# Patient Record
Sex: Female | Born: 1955
Health system: Southern US, Community
[De-identification: ages and names within clinical notes are randomized; demographics above are authoritative.]

## PROBLEM LIST (undated history)

## (undated) DIAGNOSIS — I10 Essential (primary) hypertension: Secondary | ICD-10-CM

## (undated) DIAGNOSIS — Z9889 Other specified postprocedural states: Secondary | ICD-10-CM

## (undated) DIAGNOSIS — K5010 Crohn's disease of large intestine without complications: Secondary | ICD-10-CM

## (undated) DIAGNOSIS — Z803 Family history of malignant neoplasm of breast: Secondary | ICD-10-CM

## (undated) DIAGNOSIS — A0472 Enterocolitis due to Clostridium difficile, not specified as recurrent: Secondary | ICD-10-CM

## (undated) DIAGNOSIS — R112 Nausea with vomiting, unspecified: Secondary | ICD-10-CM

## (undated) DIAGNOSIS — E119 Type 2 diabetes mellitus without complications: Secondary | ICD-10-CM

## (undated) DIAGNOSIS — E785 Hyperlipidemia, unspecified: Secondary | ICD-10-CM

## (undated) HISTORY — DX: Enterocolitis due to Clostridium difficile, not specified as recurrent: A04.72

## (undated) HISTORY — DX: Type 2 diabetes mellitus without complications: E11.9

## (undated) HISTORY — PX: SPINE SURGERY: SHX786

## (undated) HISTORY — DX: Crohn's disease of large intestine without complications: K50.10

## (undated) HISTORY — DX: Family history of malignant neoplasm of breast: Z80.3

## (undated) HISTORY — PX: KNEE SURGERY: SHX244

## (undated) HISTORY — PX: APPENDECTOMY: SHX54

---

## 1999-11-22 HISTORY — PX: OTHER SURGICAL HISTORY: SHX169

## 2005-03-15 ENCOUNTER — Encounter (INDEPENDENT_AMBULATORY_CARE_PROVIDER_SITE_OTHER): Payer: Self-pay | Admitting: *Deleted

## 2005-03-15 ENCOUNTER — Ambulatory Visit (HOSPITAL_BASED_OUTPATIENT_CLINIC_OR_DEPARTMENT_OTHER): Admission: RE | Admit: 2005-03-15 | Discharge: 2005-03-15 | Payer: Self-pay | Admitting: Obstetrics and Gynecology

## 2005-03-15 ENCOUNTER — Ambulatory Visit (HOSPITAL_COMMUNITY): Admission: RE | Admit: 2005-03-15 | Discharge: 2005-03-15 | Payer: Self-pay | Admitting: Obstetrics and Gynecology

## 2005-07-26 ENCOUNTER — Other Ambulatory Visit: Admission: RE | Admit: 2005-07-26 | Discharge: 2005-07-26 | Payer: Self-pay | Admitting: Obstetrics and Gynecology

## 2006-05-05 ENCOUNTER — Other Ambulatory Visit: Admission: RE | Admit: 2006-05-05 | Discharge: 2006-05-05 | Payer: Self-pay | Admitting: Obstetrics and Gynecology

## 2006-08-18 ENCOUNTER — Ambulatory Visit (HOSPITAL_COMMUNITY): Admission: RE | Admit: 2006-08-18 | Discharge: 2006-08-18 | Payer: Self-pay | Admitting: General Surgery

## 2006-08-18 ENCOUNTER — Encounter (INDEPENDENT_AMBULATORY_CARE_PROVIDER_SITE_OTHER): Payer: Self-pay | Admitting: Specialist

## 2006-10-28 ENCOUNTER — Emergency Department (HOSPITAL_COMMUNITY): Admission: EM | Admit: 2006-10-28 | Discharge: 2006-10-28 | Payer: Self-pay | Admitting: Emergency Medicine

## 2008-08-27 ENCOUNTER — Other Ambulatory Visit: Admission: RE | Admit: 2008-08-27 | Discharge: 2008-08-27 | Payer: Self-pay | Admitting: Family Medicine

## 2009-11-21 HISTORY — PX: CHOLECYSTECTOMY: SHX55

## 2011-04-08 NOTE — Op Note (Signed)
[name], [name]                 ACCOUNT NO.:  [id number]   MEDICAL RECORD NO.:  [id number]          PATIENT TYPE:  AMB   LOCATION:  SDS                          FACILITY:  [facility]   PHYSICIAN:  Kathrin Penner, M.D.   DATE OF BIRTH:  Oct 14, 1956   DATE OF PROCEDURE:  08/18/2006  DATE OF DISCHARGE:  08/18/2006                                 OPERATIVE REPORT   PREOPERATIVE DIAGNOSIS:  Chronic calculous cholecystitis.   POSTOPERATIVE DIAGNOSIS:  Chronic calculous cholecystitis.   PROCEDURE:  Laparoscopic cholecystectomy with intraoperative cholangiogram.   SURGEON:  Kathrin Penner, MD   ASSISTANT:  Clinton D. Annamaria Boots, MD   ANESTHESIA:  General.   NOTE:  Angela [name] is a 55 year old female presenting with symptomatic  calculous cholecystitis and documented gallstones on ultrasound, liver  function studies within normal limits.  She comes to the operating room now  after the risks and potential benefits of surgery have been discussed, all  questions answered and consent obtained.   PROCEDURE:  With the patient positioned supinely, the abdomen is prepped and  draped to be included in a sterile operative field following general  anesthesia.  Open laparoscopy created at the umbilicus with insufflation of  a Hasson cannula and with insufflation of the peritoneal cavity through the  [facility] cannula up to 14 mmHg CO2.  Camera inserted and visual exploration of  the abdomen carried out.  Chronically scarred gallbladder with multiple  adhesions, otherwise negative exploration of the abdomen.  Under direct  vision, epigastric and lateral ports were placed.  The gallbladder is  grasped and retracted cephalad.  Dissection carried down through the ampulla  and along the hepatoduodenal ligament to isolate the cystic artery and  cystic duct.  The cystic duct is isolated, clamped proximally and opened.  Cystic duct cholangiogram carried out by passing a Cook catheter into the  cystic duct and  injecting one-half strength Hypaque into the biliary system.  The resulting cholangiogram shows free flow of contrast into the duodenum,  no filling defects.  Catheter was removed and the cystic duct is doubly  clipped, triply clipped and transected.  The cystic artery is dissected  free, doubly clipped and transected.  The gallbladder is then dissected free  from the liver bed using electrocautery and maintaining hemostasis  throughout the course of the dissection with electrocautery.  At the end of  dissection the gallbladder is placed the Endopouch and the remainder of the  liver bed again inspected for hemostasis and noted to be dry.  The  gallbladder was then retrieved through the umbilical port without  difficulty.  Sponge and instrument counts were verified and the wound was  closed in layers as follows after the pneumoperitoneum was allowed to  deflate.   Epigastric and umbilical wound closed in 0 Dexon and 4-0 Monocryl,  epigastric and flank wounds closed with 4-0 Monocryl sutures.  All wounds  reinforced with Steri-Strips.  Sterile dressings applied.  The anesthetic  reversed.  The patient removed from the operating room to the recovery room  in stable condition.  She tolerated the  procedure well.      Kathrin Penner, M.D.  Electronically Signed     PB/MEDQ  D:  08/18/2006  T:  08/21/2006  Job:  [id number]

## 2011-04-08 NOTE — Op Note (Signed)
NAMEMULKI, Angela [name]                 ACCOUNT NO.:  [id number]   MEDICAL RECORD NO.:  [id number]          PATIENT TYPE:  AMB   LOCATION:  [facility]                         FACILITY:  [facility]   PHYSICIAN:  Paula Compton, M.D. DATE OF BIRTH:  [birth year]   DATE OF PROCEDURE:  03/15/2005  DATE OF DISCHARGE:                                 OPERATIVE REPORT   PREOPERATIVE DIAGNOSIS:  High-grade SIL on her Pap smear and ECC status post  previous LEEP procedure.   POSTOPERATIVE DIAGNOSIS:  High-grade SIL on her Pap smear and ECC status  post previous LEEP procedure.   PROCEDURE:  Cold knife conization.   SURGEON:  Dr. Paula Compton.   ANESTHESIA:  General.   SPECIMENS:  Cervical cone specimen sent.   ESTIMATED BLOOD LOSS:  100 cc.   URINE OUTPUT:  Straight cathed for approximately 100 cc after procedure.   FINDINGS:  Cervix was noted to be very short about 1.5 cm long.  No other  abnormalities were noted.   PROCEDURE:  The patient was taken to operating room where general anesthesia  was obtained without difficulty. She was then prepped and draped in normal  sterile fashion in dorsal lithotomy position.  A speculum was then placed  within the vagina and the cervix noted to be quite distal in the vagina as  well as only approximately a 1 to 1.5 cm long; therefore, it was grasped  with a tenaculum in order to place stay sutures at the 2 to 4 o'clock  position and the 10 to 7 o'clock position. These were tied down and dilute  solution of Pitressin was also injected superficially.  The tenaculum was  then removed and the stay sutures pulled straight out to elevate the cervix  towards the introitus as best possible.  The scalpel was then used to  circumferentially cut around the squamocolumnar junction to a depth of  approximately 0.5 cm to 1 cm at its deepest depth.  This was slightly more  difficult on the posterior surface as the cervix was slightly shorter in  this plane from the  prior procedures; however, was successfully removed and  Mayo scissors used to cut across the base of the specimen. This was then  totally amputated and marked at 12 o'clock with a suture.  Some small  additional tissue which was fragmented was also removed with Mayo scissors  and sent with specimen on the posterior surface as the cone was not able to  go quite as deep on this plane. At this point there was some light bleeding  noted which was controlled both with the rollerball cautery of the base of  the cervix and several  other additional sutures were both utilized to control bleeding as well as  evert the squamocolumnar junction with good control of all bleeding.  A  small amount of Monsel was placed within the cervical base and all  instruments and sponges were removed from the patient's vagina. She was  taken to the recovery room in stable condition.      KR/MEDQ  D:  03/15/2005  T:  03/15/2005  Job:  [id number]

## 2013-08-26 ENCOUNTER — Other Ambulatory Visit: Payer: Self-pay | Admitting: Gastroenterology

## 2014-05-28 DIAGNOSIS — N3090 Cystitis, unspecified without hematuria: Secondary | ICD-10-CM | POA: Insufficient documentation

## 2014-05-28 DIAGNOSIS — Z880 Allergy status to penicillin: Secondary | ICD-10-CM | POA: Insufficient documentation

## 2014-05-28 DIAGNOSIS — I10 Essential (primary) hypertension: Secondary | ICD-10-CM | POA: Insufficient documentation

## 2014-05-28 DIAGNOSIS — Z79899 Other long term (current) drug therapy: Secondary | ICD-10-CM | POA: Insufficient documentation

## 2014-05-28 DIAGNOSIS — R319 Hematuria, unspecified: Secondary | ICD-10-CM | POA: Insufficient documentation

## 2014-05-28 DIAGNOSIS — Z792 Long term (current) use of antibiotics: Secondary | ICD-10-CM | POA: Insufficient documentation

## 2014-05-29 ENCOUNTER — Encounter (HOSPITAL_BASED_OUTPATIENT_CLINIC_OR_DEPARTMENT_OTHER): Payer: Self-pay | Admitting: Emergency Medicine

## 2014-05-29 ENCOUNTER — Emergency Department (HOSPITAL_BASED_OUTPATIENT_CLINIC_OR_DEPARTMENT_OTHER)
Admission: EM | Admit: 2014-05-29 | Discharge: 2014-05-29 | Disposition: A | Discharge status: Home / Self Care | Payer: BC Managed Care – PPO | Attending: Emergency Medicine | Admitting: Emergency Medicine

## 2014-05-29 DIAGNOSIS — N3090 Cystitis, unspecified without hematuria: Secondary | ICD-10-CM

## 2014-05-29 HISTORY — DX: Hyperlipidemia, unspecified: E78.5

## 2014-05-29 HISTORY — DX: Essential (primary) hypertension: I10

## 2014-05-29 LAB — URINALYSIS, ROUTINE W REFLEX MICROSCOPIC
GLUCOSE, UA: NEGATIVE mg/dL
Ketones, ur: 40 mg/dL — AB
Nitrite: POSITIVE — AB
PH: 5.5 (ref 5.0–8.0)
Specific Gravity, Urine: 1.029 (ref 1.005–1.030)
Urobilinogen, UA: 1 mg/dL (ref 0.0–1.0)

## 2014-05-29 LAB — URINE MICROSCOPIC-ADD ON

## 2014-05-29 MED ORDER — CIPROFLOXACIN HCL 500 MG PO TABS
500.0000 mg | ORAL_TABLET | Freq: Once | ORAL | Status: AC
  Administered 2014-05-29: 500 mg via ORAL
  Filled 2014-05-29: qty 1

## 2014-05-29 MED ORDER — CIPROFLOXACIN HCL 250 MG PO TABS: 250.0000 mg | ORAL_TABLET | Freq: Two times a day (BID) | ORAL | Status: DC

## 2014-05-29 MED ORDER — PHENAZOPYRIDINE HCL 200 MG PO TABS: 200.0000 mg | ORAL_TABLET | Freq: Three times a day (TID) | ORAL | Status: DC | PRN

## 2014-05-29 NOTE — ED Notes (Signed)
Pt reports chronic history of uti's, states she developed dysuria and blood in urine 1 hour ago.

## 2014-05-29 NOTE — ED Provider Notes (Signed)
CSN: [id number]     Arrival date & time 05/28/14  2357 History   First MD Initiated Contact with Patient 05/29/14 0020     Chief Complaint  Patient presents with  . Dysuria     (Consider location/radiation/quality/duration/timing/severity/associated sxs/prior Treatment) HPI Patient presents with dysuria, urinary frequency and hematuria starting this evening. She states she's had multiple urinary tract infections in the past and the symptoms are exactly the same. Denies any fevers or chills. She's had no nausea or vomiting. She denies any abdominal pain or flank pain. She's had no vaginal symptoms. Past Medical History  Diagnosis Date  . Hypertension   . Hyperlipidemia    History reviewed. No pertinent past surgical history. History reviewed. No pertinent family history. History  Substance Use Topics  . Smoking status: Never Smoker   . Smokeless tobacco: Not on file  . Alcohol Use: No   OB History   Grav Para Term Preterm Abortions TAB SAB Ect Mult Living                 Review of Systems  Constitutional: Negative for fever and chills.  Gastrointestinal: Negative for nausea, vomiting and abdominal pain.  Genitourinary: Positive for dysuria, frequency and hematuria. Negative for flank pain, vaginal bleeding and pelvic pain.  Musculoskeletal: Negative for back pain, myalgias, neck pain and neck stiffness.  Skin: Negative for rash and wound.  Neurological: Negative for dizziness, weakness, light-headedness, numbness and headaches.  All other systems reviewed and are negative.     Allergies  Penicillins  Home Medications   Prior to Admission medications   Medication Sig Start Date End Date Taking? Authorizing Provider  benazepril-hydrochlorthiazide (LOTENSIN HCT) 10-12.5 MG per tablet Take 1 tablet by mouth daily.   Yes Historical Provider, MD  ciprofloxacin (CIPRO) 250 MG tablet Take 1 tablet (250 mg total) by mouth every 12 (twelve) hours. 05/29/14   Julianne Rice, MD   phenazopyridine (PYRIDIUM) 200 MG tablet Take 1 tablet (200 mg total) by mouth 3 (three) times daily as needed for pain. 05/29/14   Julianne Rice, MD   BP 130/83  Pulse 76  Temp(Src) 98 F (36.7 C) (Oral)  Resp 18  Ht 5' 4"  (1.626 m)  Wt 150 lb (68.04 kg)  BMI 25.73 kg/m2  SpO2 96% Physical Exam  Nursing note and vitals reviewed. Constitutional: She is oriented to person, place, and time. She appears well-developed and well-nourished. No distress.  HENT:  Head: Normocephalic and atraumatic.  Mouth/Throat: Oropharynx is clear and moist.  Eyes: EOM are normal. Pupils are equal, round, and reactive to light.  Neck: Normal range of motion. Neck supple.  Cardiovascular: Normal rate and regular rhythm.   Pulmonary/Chest: Effort normal and breath sounds normal. No respiratory distress. She has no wheezes. She has no rales.  Abdominal: Soft. Bowel sounds are normal. She exhibits no distension and no mass. There is no tenderness. There is no rebound and no guarding.  Musculoskeletal: Normal range of motion. She exhibits no edema and no tenderness.  No CVA tenderness.  Neurological: She is alert and oriented to person, place, and time.  Skin: Skin is warm and dry. No rash noted. No erythema.  Psychiatric: She has a normal mood and affect. Her behavior is normal.    ED Course  Procedures (including critical care time) Labs Review Labs Reviewed  URINALYSIS, ROUTINE W REFLEX MICROSCOPIC - Abnormal; Notable for the following:    Color, Urine RED (*)    APPearance TURBID (*)  Hgb urine dipstick LARGE (*)    Bilirubin Urine LARGE (*)    Ketones, ur 40 (*)    Protein, ur >300 (*)    Nitrite POSITIVE (*)    Leukocytes, UA LARGE (*)    All other components within normal limits  URINE MICROSCOPIC-ADD ON - Abnormal; Notable for the following:    Squamous Epithelial / LPF FEW (*)    Bacteria, UA MANY (*)    All other components within normal limits    Imaging Review No results  found.   EKG Interpretation None      MDM   Final diagnoses:  Cystitis    Patient or well-appearing presents with symptoms of urinary tract infection and evidence of on urinalysis. Antibiotic given in the emergency department and discharged home with prescription. Return precautions given.    Julianne Rice, MD 05/29/14 [phone]

## 2014-05-29 NOTE — ED Notes (Signed)
Sudden onset of pain w urination,  Increased freq, and blood in urine approx 1 hr pta,  Hx of uti

## 2014-05-29 NOTE — Discharge Instructions (Signed)

## 2014-11-19 ENCOUNTER — Other Ambulatory Visit: Payer: Self-pay | Admitting: Family Medicine

## 2014-11-19 DIAGNOSIS — M5410 Radiculopathy, site unspecified: Secondary | ICD-10-CM

## 2014-11-19 DIAGNOSIS — M545 Low back pain: Secondary | ICD-10-CM

## 2014-11-24 ENCOUNTER — Ambulatory Visit: Payer: BC Managed Care – PPO

## 2014-12-01 ENCOUNTER — Other Ambulatory Visit: Payer: Self-pay

## 2014-12-01 ENCOUNTER — Other Ambulatory Visit: Payer: 59

## 2014-12-11 DIAGNOSIS — M5126 Other intervertebral disc displacement, lumbar region: Secondary | ICD-10-CM | POA: Insufficient documentation

## 2016-08-08 ENCOUNTER — Other Ambulatory Visit: Payer: Self-pay | Admitting: Family Medicine

## 2017-01-02 ENCOUNTER — Other Ambulatory Visit: Payer: Self-pay | Admitting: Family Medicine

## 2017-01-27 DIAGNOSIS — E782 Mixed hyperlipidemia: Secondary | ICD-10-CM | POA: Diagnosis not present

## 2017-01-27 DIAGNOSIS — R6889 Other general symptoms and signs: Secondary | ICD-10-CM | POA: Diagnosis not present

## 2017-01-27 DIAGNOSIS — I10 Essential (primary) hypertension: Secondary | ICD-10-CM | POA: Diagnosis not present

## 2017-03-13 DIAGNOSIS — R3915 Urgency of urination: Secondary | ICD-10-CM | POA: Diagnosis not present

## 2017-03-13 DIAGNOSIS — R300 Dysuria: Secondary | ICD-10-CM | POA: Diagnosis not present

## 2017-09-04 DIAGNOSIS — R590 Localized enlarged lymph nodes: Secondary | ICD-10-CM | POA: Diagnosis not present

## 2017-09-04 DIAGNOSIS — E782 Mixed hyperlipidemia: Secondary | ICD-10-CM | POA: Diagnosis not present

## 2017-09-04 DIAGNOSIS — I10 Essential (primary) hypertension: Secondary | ICD-10-CM | POA: Diagnosis not present

## 2017-09-15 DIAGNOSIS — L739 Follicular disorder, unspecified: Secondary | ICD-10-CM | POA: Diagnosis not present

## 2017-11-08 ENCOUNTER — Encounter: Payer: Self-pay | Admitting: Genetic Counselor

## 2017-11-08 ENCOUNTER — Ambulatory Visit (HOSPITAL_BASED_OUTPATIENT_CLINIC_OR_DEPARTMENT_OTHER): Payer: 59 | Admitting: Genetic Counselor

## 2017-11-08 ENCOUNTER — Other Ambulatory Visit: Payer: 59

## 2017-11-08 DIAGNOSIS — Z803 Family history of malignant neoplasm of breast: Secondary | ICD-10-CM | POA: Diagnosis not present

## 2017-11-08 NOTE — Progress Notes (Signed)
REFERRING PROVIDER: Jovita Kussmaul, MD [address], [place] [address]  PRIMARY PROVIDER:  Orpah Melter, MD  PRIMARY REASON FOR VISIT:  1. Family history of breast cancer      HISTORY OF PRESENT ILLNESS:   Ms. Thurmon, a 61 y.o. female, was seen for a [facility] cancer genetics consultation at the request of Dr. Marlou Starks due to a family history of cancer.  Ms. Guerry presents to clinic today to discuss the possibility of a hereditary predisposition to cancer, genetic testing, and to further clarify her future cancer risks, as well as potential cancer risks for family members. Ms. Chenard is a 61 y.o. female with no personal history of cancer.    CANCER HISTORY:   No history exists.     HORMONAL RISK FACTORS:  Menarche was at age 70.  First live birth at age 17.  OCP use for approximately <5 years.  Ovaries intact: yes.  Hysterectomy: no.  Menopausal status: postmenopausal.  HRT use: 0 years. Colonoscopy: yes; normal. Mammogram within the last year: yes. Number of breast biopsies: 1. Up to date with pelvic exams:  yes. Any excessive radiation exposure in the past:  no  Past Medical History:  Diagnosis Date  . Family history of breast cancer   . Hyperlipidemia   . Hypertension     No past surgical history on file.  Social History   Socioeconomic History  . Marital status: Married    Spouse name: Not on file  . Number of children: 3  . Years of education: Not on file  . Highest education level: Not on file  Social Needs  . Financial resource strain: Not on file  . Food insecurity - worry: Not on file  . Food insecurity - inability: Not on file  . Transportation needs - medical: Not on file  . Transportation needs - non-medical: Not on file  Occupational History  . Not on file  Tobacco Use  . Smoking status: Current Some Day Smoker    Years: 20.00  . Smokeless tobacco: Never Used  Substance and Sexual Activity  . Alcohol use: No  . Drug use: Not  on file  . Sexual activity: Not on file  Other Topics Concern  . Not on file  Social History Narrative  . Not on file     FAMILY HISTORY:  We obtained a detailed, 4-generation family history.  Significant diagnoses are listed below: Family History  Problem Relation Age of Onset  . Esophageal cancer Father   . Leukemia Sister 5  . Breast cancer Maternal Aunt 75  . Breast cancer Maternal Grandmother        dx in her late 38s  . Heart attack Paternal Grandfather   . Cervical cancer Sister     The patient has three sons who are cancer free.  She has four sisters and two brothers.  One sister had leukemia at age 36, another sister had cervical cancer.  The patients father is deceased and her mother is alive.  The patient's mother was diagnosed with breast cancer at age 26.  She had two sisters, one who had breast cancer at 72 and died at 47.  The maternal grandparents are deceased.  The grandmother had breast cancer in her late 84's, and the grandfather died in his 44's.  The patient's father ha done sister who is living.  She had a son who had an unknown cancer.  The paternal grandparents are deceased.  The  grandfather from a heart attack and the grandmother from old age.  Ms. Mizzell is unaware of previous family history of genetic testing for hereditary cancer risks. Patient's maternal ancestors are of [place] descent, and paternal ancestors are of [place], [place], [place] and [place] descent. There is no reported Ashkenazi Jewish ancestry. There is no known consanguinity.  GENETIC COUNSELING ASSESSMENT: Jermiah Howton is a 61 y.o. female with a family history of breast cancer which is somewhat suggestive of a hereditary cancer syndrome and predisposition to cancer. We, therefore, discussed and recommended the following at today's visit.   DISCUSSION: We discussed that about 5-10% of breast cancer is hereditary with most cases due to BRCA mutations.  Other genes associated with hereditary breast  cancer syndromes include ATM, CHEK2 and PALB2.  We reviewed the characteristics, features and inheritance patterns of hereditary cancer syndromes. We also discussed genetic testing, including the appropriate family members to test, the process of testing, insurance coverage and turn-around-time for results. Based on the family history, the patient is not the best person to test. She would not be informative for the rest of the family.  The patient's mother would be a better person to test.  The patient feels that it would be too difficult to get her mother tested. We discussed the implications of a negative, positive and/or variant of uncertain significant result. We recommended Ms. Lancon pursue genetic testing for the common hereditary cancer panel.  The Hereditary Gene Panel offered by [facility] includes sequencing and/or deletion duplication testing of the following 47 genes: APC, ATM, AXIN2, BARD1, BMPR1A, BRCA1, BRCA2, BRIP1, CDH1, CDK4, CDKN2A (p14ARF), CDKN2A (p16INK4a), CHEK2, CTNNA1, DICER1, EPCAM (Deletion/duplication testing only), GREM1 (promoter region deletion/duplication testing only), KIT, MEN1, MLH1, MSH2, MSH3, MSH6, MUTYH, NBN, NF1, NHTL1, PALB2, PDGFRA, PMS2, POLD1, POLE, PTEN, RAD50, RAD51C, RAD51D, SDHB, SDHC, SDHD, SMAD4, SMARCA4. STK11, TP53, TSC1, TSC2, and VHL.  The following genes were evaluated for sequence changes only: SDHA and HOXB13 c.251G>A variant only.  gene panel.   Based on Ms. Clare's family history of cancer, she meets medical criteria for genetic testing. Despite that she meets criteria, she may still have an out of pocket cost. We discussed that if her out of pocket cost for testing is over $100, the laboratory will call and confirm whether she wants to proceed with testing.  If the out of pocket cost of testing is less than $100 she will be billed by the genetic testing laboratory.   PLAN: After considering the risks, benefits, and limitations, Ms. Mcconnon  provided informed  consent to pursue genetic testing and the blood sample was sent to [facility] for analysis of the common hereditary cancer panel. Results should be available within approximately 2-3 weeks' time, at which point they will be disclosed by telephone to Ms. Elk, as will any additional recommendations warranted by these results. Ms. Dillen will receive a summary of her genetic counseling visit and a copy of her results once available. This information will also be available in Epic. We encouraged Ms. Leech to remain in contact with cancer genetics annually so that we can continuously update the family history and inform her of any changes in cancer genetics and testing that may be of benefit for her family. Ms. Boyte questions were answered to her satisfaction today. Our contact information was provided should additional questions or concerns arise.  Lastly, we encouraged Ms. Syler to remain in contact with cancer genetics annually so that we can continuously update the family history and  inform her of any changes in cancer genetics and testing that may be of benefit for this family.   Ms.  Vantol questions were answered to her satisfaction today. Our contact information was provided should additional questions or concerns arise. Thank you for the referral and allowing [place] to share in the care of your patient.   Rhiannon Sassaman P. Florene Glen, [place], [facility] Certified Genetic Counselor [email or web address] phone: [phone]  The patient was seen for a total of 35 minutes in face-to-face genetic counseling.  This patient was discussed with Drs. Magrinat, Lindi Adie and/or Burr Medico who agrees with the above.    _______________________________________________________________________ For Office Staff:  Number of people involved in session: 1 Was an Intern/ student involved with case: no

## 2017-11-23 ENCOUNTER — Encounter: Payer: Self-pay | Admitting: Genetic Counselor

## 2017-11-23 DIAGNOSIS — Z1379 Encounter for other screening for genetic and chromosomal anomalies: Secondary | ICD-10-CM | POA: Insufficient documentation

## 2017-11-24 ENCOUNTER — Telehealth: Payer: Self-pay | Admitting: Genetic Counselor

## 2017-11-24 ENCOUNTER — Ambulatory Visit: Payer: Self-pay | Admitting: Genetic Counselor

## 2017-11-24 DIAGNOSIS — Z1379 Encounter for other screening for genetic and chromosomal anomalies: Secondary | ICD-10-CM

## 2017-11-24 DIAGNOSIS — Z803 Family history of malignant neoplasm of breast: Secondary | ICD-10-CM

## 2017-11-24 NOTE — Progress Notes (Signed)
HPI: Angela [name] was previously seen in the [facility] due to a family history of cancer and concerns regarding a hereditary predisposition to cancer. Please refer to our prior cancer genetics clinic note for more information regarding Angela [name]'s medical, social and family histories, and our assessment and recommendations, at the time. Angela [name] recent genetic test results were disclosed to her, as were recommendations warranted by these results. These results and recommendations are discussed in more detail below.  CANCER HISTORY:   No history exists.    FAMILY HISTORY:  We obtained a detailed, 4-generation family history.  Significant diagnoses are listed below: Family History  Problem Relation Age of Onset  . Esophageal cancer Father   . Leukemia Sister 5  . Breast cancer Maternal Aunt 12  . Breast cancer Maternal Grandmother        dx in her late 1s  . Heart attack Paternal Grandfather   . Cervical cancer Sister     The patient has three sons who are cancer free.  She has four sisters and two brothers.  One sister had leukemia at age 24, another sister had cervical cancer.  The patients father is deceased and her mother is alive.  The patient's mother was diagnosed with breast cancer at age 96.  She had two sisters, one who had breast cancer at 65 and died at 93.  The maternal grandparents are deceased.  The grandmother had breast cancer in her late 2's, and the grandfather died in his 11's.  The patient's father ha done sister who is living.  She had a son who had an unknown cancer.  The paternal grandparents are deceased.  The grandfather from a heart attack and the grandmother from old age.  Angela [name] is unaware of previous family history of genetic testing for hereditary cancer risks. Patient's maternal ancestors are of [place] descent, and paternal ancestors are of [place], [place], [place] and [place] descent. There is no reported Ashkenazi Jewish ancestry.  There is no known consanguinity.  GENETIC TEST RESULTS: Genetic testing reported out on November 24, 2016 through the common hereditary cancer panel found no deleterious mutations.  The Hereditary Gene Panel offered by [facility] includes sequencing and/or deletion duplication testing of the following 47 genes: APC, ATM, AXIN2, BARD1, BMPR1A, BRCA1, BRCA2, BRIP1, CDH1, CDK4, CDKN2A (p14ARF), CDKN2A (p16INK4a), CHEK2, CTNNA1, DICER1, EPCAM (Deletion/duplication testing only), GREM1 (promoter region deletion/duplication testing only), KIT, MEN1, MLH1, MSH2, MSH3, MSH6, MUTYH, NBN, NF1, NHTL1, PALB2, PDGFRA, PMS2, POLD1, POLE, PTEN, RAD50, RAD51C, RAD51D, SDHB, SDHC, SDHD, SMAD4, SMARCA4. STK11, TP53, TSC1, TSC2, and VHL.  The following genes were evaluated for sequence changes only: SDHA and HOXB13 c.251G>A variant only.   The test report has been scanned into EPIC and is located under the Molecular Pathology section of the Results Review tab.   We discussed with Angela [name] that since the current genetic testing is not perfect, it is possible there may be a gene mutation in one of these genes that current testing cannot detect, but that chance is small. We also discussed, that it is possible that another gene that has not yet been discovered, or that we have not yet tested, is responsible for the cancer diagnoses in the family, and it is, therefore, important to remain in touch with cancer genetics in the future so that we can continue to offer Angela [name] the most up to date genetic testing.   Genetic testing did detect a Variant of Unknown Significance in  the MSH2 gene called c.1892G>A (p.Arg631Lys). This is a variant that is not present in population databases and has not been reported in the literature in individuals with an MSH2-related disease. At this time, it is unknown if this variant is associated with increased cancer risk or if this is a normal finding, but most variants such as this get reclassified to  being inconsequential. It should not be used to make medical management decisions. With time, we suspect the lab will determine the significance of this variant, if any. If we do learn more about it, we will try to contact Angela [name] to discuss it further. However, it is important to stay in touch with [place] periodically and keep the address and phone number up to date.     CANCER SCREENING RECOMMENDATIONS:  This normal result is reassuring and indicates that Angela [name] does not likely have an increased risk of cancer due to a mutation in one of these genes.  We, therefore, recommended  Angela [name] continue to follow the cancer screening guidelines provided by her primary healthcare providers.   Based on the Angela [name]'s personal and family history of cancer, as well as her genetic test results, statistical models (Tyrer [place] and [place] model)  and literature data were used to estimate her risk of developing breast cancer. These estimates her lifetime risk of developing breast cancer to be approximately 15.5% to 17%.  The patient's lifetime breast cancer risk is a preliminary estimate based on available information using one of several models endorsed by the [place] (ACS). The ACS recommends consideration of breast MRI screening as an adjunct to mammography for patients at high risk (defined as 20% or greater lifetime risk). A more detailed breast cancer risk assessment can be considered, if clinically indicated. At this time, Angela [name] does not meet ACS criteria to be followed by breast MRI.     RECOMMENDATIONS FOR FAMILY MEMBERS: Women in this family might be at some increased risk of developing cancer, over the general population risk, simply due to the family history of cancer. We recommended women in this family have a yearly mammogram beginning at age 16, or 62 years younger than the earliest onset of cancer, an annual clinical breast exam, and perform monthly breast self-exams. Women in  this family should also have a gynecological exam as recommended by their primary provider. All family members should have a colonoscopy by age 28.  Based on Angela [name]'s family history, we recommended her sisters have genetic counseling and testing. Angela [name] will let us know if we can be of any assistance in coordinating genetic counseling and/or testing for this family member.   FOLLOW-UP: Lastly, we discussed with Angela [name] that cancer genetics is a rapidly advancing field and it is possible that new genetic tests will be appropriate for her and/or her family members in the future. We encouraged her to remain in contact with cancer genetics on an annual basis so we can update her personal and family histories and let her know of advances in cancer genetics that may benefit this family.   Our contact number was provided. Angela [name] questions were answered to her satisfaction, and she knows she is welcome to call us at anytime with additional questions or concerns.   Roma Kayser, MS, [facility] Certified Genetic Counselor [email or web address]

## 2017-11-24 NOTE — Telephone Encounter (Signed)
Revealed negative genetic testing.  Discussed that we do not know why there is cancer in the family. It could be due to a different gene that we are not testing, or maybe our current technology may not be able to pick something up.  It will be important for her to keep in contact with genetics to keep up with whether additional testing may be needed.

## 2017-11-25 DIAGNOSIS — R05 Cough: Secondary | ICD-10-CM | POA: Diagnosis not present

## 2017-11-25 DIAGNOSIS — J189 Pneumonia, unspecified organism: Secondary | ICD-10-CM | POA: Diagnosis not present

## 2017-12-13 DIAGNOSIS — J069 Acute upper respiratory infection, unspecified: Secondary | ICD-10-CM | POA: Diagnosis not present

## 2017-12-21 DIAGNOSIS — R509 Fever, unspecified: Secondary | ICD-10-CM | POA: Diagnosis not present

## 2017-12-21 DIAGNOSIS — R5383 Other fatigue: Secondary | ICD-10-CM | POA: Diagnosis not present

## 2017-12-29 DIAGNOSIS — R509 Fever, unspecified: Secondary | ICD-10-CM | POA: Diagnosis not present

## 2018-02-01 DIAGNOSIS — R509 Fever, unspecified: Secondary | ICD-10-CM | POA: Diagnosis not present

## 2018-02-06 ENCOUNTER — Ambulatory Visit (INDEPENDENT_AMBULATORY_CARE_PROVIDER_SITE_OTHER): Payer: 59 | Admitting: Family

## 2018-02-06 ENCOUNTER — Encounter: Payer: Self-pay | Admitting: Family

## 2018-02-06 VITALS — BP 112/71 | HR 85 | Temp 98.3°F | Ht 65.0 in | Wt 154.8 lb

## 2018-02-06 DIAGNOSIS — Z7289 Other problems related to lifestyle: Secondary | ICD-10-CM

## 2018-02-06 DIAGNOSIS — R509 Fever, unspecified: Secondary | ICD-10-CM | POA: Diagnosis not present

## 2018-02-06 NOTE — Patient Instructions (Signed)
Nice to meet you.  We will check your blood work today to look for infections.   We may need to consider imaging.  Please work on completing your cancer screenings including your PAP, colonoscopy and mammogram.  Continue to monitor your fevers at home.  We will follow up once blood work is available.  Fever, Adult A fever is an increase in the body's temperature. It is usually defined as a temperature of 100F (38C) or higher. Brief mild or moderate fevers generally have no long-term effects, and they often do not require treatment. Moderate or high fevers may make you feel uncomfortable and can sometimes be a sign of a serious illness or disease. The sweating that may occur with repeated or prolonged fever may also cause dehydration. Fever is confirmed by taking a temperature with a thermometer. A measured temperature can vary with:  Age.  Time of day.  Location of the thermometer: ? Mouth (oral). ? Rectum (rectal). ? Ear (tympanic). ? Underarm (axillary). ? Forehead (temporal).  Follow these instructions at home: Pay attention to any changes in your symptoms. Take these actions to help with your condition:  Take over-the counter and prescription medicines only as told by your health care provider. Follow the dosing instructions carefully.  If you were prescribed an antibiotic medicine, take it as told by your health care provider. Do not stop taking the antibiotic even if you start to feel better.  Rest as needed.  Drink enough fluid to keep your urine clear or pale yellow. This helps to prevent dehydration.  Sponge yourself or bathe with room-temperature water to help reduce your body temperature as needed. Do not use ice water.  Do not overbundle yourself in blankets or heavy clothes.  Contact a health care provider if:  You vomit.  You cannot eat or drink without vomiting.  You have diarrhea.  You have pain when you urinate.  Your symptoms do not improve with  treatment.  You develop new symptoms.  You develop excessive weakness. Get help right away if:  You have shortness of breath or have trouble breathing.  You are dizzy or you faint.  You are disoriented or confused.  You develop signs of dehydration, such as a dry mouth, decreased urination, or paleness.  You develop severe pain in your abdomen.  You have persistent vomiting or diarrhea.  You develop a skin rash.  Your symptoms suddenly get worse. This information is not intended to replace advice given to you by your health care provider. Make sure you discuss any questions you have with your health care provider. Document Released: 05/03/2001 Document Revised: 04/14/2016 Document Reviewed: 01/01/2015 Elsevier Interactive Patient Education  [employer].

## 2018-02-06 NOTE — Assessment & Plan Note (Addendum)
Angela [name] has had a fever for about 3.5 months ranging from 100-103 resulting in her taking antipyretics to help control. Based on her lab work reviewed I do not believe this is related to EBV, as all of the IgG's are positive with no indication of acute infection. There is concern for infection or potential malignancy. I have requested she work on completing her cancer screenings including PAP, colonoscopy and mammogram. I will check her blood cultures, inflammatory markers, HIV, Hepatitis C, ANA, RF , and LDH. Recommend that she monitor her temperatures through temperature log. We will follow up once results are obtained.

## 2018-02-06 NOTE — Progress Notes (Signed)
Subjective:    Patient ID: Angela [name], female    DOB: July 28, 1956, 62 y.o.   MRN: [id number]  Chief Complaint  Patient presents with  . Follow-up    fever X 3.5 months    HPI:  Angela [name] is a 62 y.o. female who presents today evaluation of fevers.  [name] symptoms began in December 2018 when she was seen in urgent care and diagnosed with pneumonia. She was treated with azithromycin from which she initially improved however fevers returned but symptoms of pneumonia were improved. She was seen in her primary care office on 1/23 with concerns for fatigue, rhinorrhea, sore throat, chest congestion, and a fever of 102. She was diagnosed at the time with upper respiratory infection and started on doxycycline for 7 days as well as symptomatic treatment. She was seen on 1/31 with continued fevers and feeling "exhausted" with fevers primarily occurring in the afternoon. At that time her highest fever was 100.0. Other symptoms improved following completion of doxycycline. At this office visit she was noted to have abdominal pain described as sharp and jabbing which was intermittent. Her Monospot was negative she did have leukocytosis of 16.1 as well as an elevated neutrophil count of 12.3. All other labs were unremarkable. She was seen once again in primary care in 2/88 with ongoing fevers and exhaustion with intermittent fevers now on the morning as well ranging between 99 and 101. She also noted increased sleeping going to that at 8:30 in the evening and waking up at 10 AM is still feeling tired. White blood count remained elevated at 16.3 with continued increase in neutrophils and now mild increase in monocytes. Epstein-Barr virus antibody VCA IgG and nuclear antibody IgG were all positive. She was most recently seen on 3/14 with fevers ranging from 100 to 102 and noted weight change of approximately 10 pound weight loss. She described having night sweats when her fever breaks at night.  She  continues to have fevers and wakes up in the morning ranging from 100.0-101. At night fevers are ranging from 102-103. Modifying factors include Motrin and Advil. Other symptoms that she is having include chest with coughing and describes as feeling like congestion. Symptoms are generally worsening. States she is drinking a lot of water which she has not done in the past. Fatigue has been severe enough to interfere with her day to day activities. Denies being in any communal settings recently.       Outpatient Medications Prior to Visit  Medication Sig Dispense Refill  . acetaminophen (TYLENOL) 325 MG tablet Take 650 mg by mouth every 4 (four) hours as needed.    . benazepril-hydrochlorthiazide (LOTENSIN HCT) 10-12.5 MG per tablet Take 1 tablet by mouth daily.    Marland Kitchen ibuprofen (ADVIL,MOTRIN) 400 MG tablet Take 400 mg by mouth every 4 (four) hours as needed for fever or headache.    . ciprofloxacin (CIPRO) 250 MG tablet Take 1 tablet (250 mg total) by mouth every 12 (twelve) hours. (Patient not taking: Reported on 02/06/2018) 10 tablet 0  . phenazopyridine (PYRIDIUM) 200 MG tablet Take 1 tablet (200 mg total) by mouth 3 (three) times daily as needed for pain. 10 tablet 0   No facility-administered medications prior to visit.      Past Medical History:  Diagnosis Date  . Family history of breast cancer   . Hyperlipidemia   . Hypertension       Past Surgical History:  Procedure Laterality Date  . CESAREAN SECTION    .  KNEE SURGERY Right   . SPINE SURGERY     Herniated Disc      Family History  Problem Relation Age of Onset  . Breast cancer Mother   . Esophageal cancer Father   . Leukemia Sister 5  . Breast cancer Maternal Aunt 37  . Breast cancer Maternal Grandmother        dx in her late 35s  . Heart attack Paternal Grandfather   . Cervical cancer Sister       Social History   Socioeconomic History  . Marital status: Married    Spouse name: Not on file  . Number of  children: 3  . Years of education: 81  . Highest education level: Not on file  Social Needs  . Financial resource strain: Not on file  . Food insecurity - worry: Not on file  . Food insecurity - inability: Not on file  . Transportation needs - medical: Not on file  . Transportation needs - non-medical: Not on file  Occupational History  . Not on file  Tobacco Use  . Smoking status: Former Smoker    Years: 20.00  . Smokeless tobacco: Never Used  Substance and Sexual Activity  . Alcohol use: No  . Drug use: No  . Sexual activity: Not on file  Other Topics Concern  . Not on file  Social History Narrative  . Not on file      Review of Systems  Constitutional: Positive for fatigue, fever and unexpected weight change. Negative for appetite change, chills and diaphoresis.  Respiratory: Positive for cough (occasional). Negative for shortness of breath and wheezing.   Cardiovascular: Negative for chest pain, palpitations and leg swelling.       Objective:    BP 112/71 (BP Location: Right Arm, Patient Position: Sitting, Cuff Size: Normal)   Pulse 85   Temp 98.3 F (36.8 C) (Oral)   Ht 5' 5"  (1.651 m)   Wt 154 lb 12 oz (70.2 kg)   BMI 25.75 kg/m  Nursing note and vital signs reviewed.  Physical Exam  Constitutional: She is oriented to person, place, and time. She appears well-developed and well-nourished. No distress.  Neck: Neck supple.  Cardiovascular: Normal rate, regular rhythm, normal heart sounds and intact distal pulses. Exam reveals no gallop and no friction rub.  No murmur heard. Pulmonary/Chest: Effort normal and breath sounds normal. No respiratory distress. She has no wheezes. She has no rales. She exhibits no tenderness.  Abdominal: Soft. Bowel sounds are normal. She exhibits no distension. There is tenderness (Mild RLQ tenderness lower than McBurney's ). There is no rebound and no guarding.  Lymphadenopathy:    She has no cervical adenopathy.  Neurological:  She is alert and oriented to person, place, and time.  Skin: Skin is warm and dry. No rash noted. No erythema.  Psychiatric: She has a normal mood and affect. Her behavior is normal. Judgment and thought content normal.        Assessment & Plan:   Problem List Items Addressed This Visit      Other   Fever - Primary    Mrs. Cona has had a fever for about 3.5 months ranging from 100-103 resulting in her taking antipyretics to help control. Based on her lab work reviewed I do not believe this is related to EBV, as all of the IgG's are positive with no indication of acute infection. There is concern for infection or potential malignancy. I have requested she work on  completing her cancer screenings including PAP, colonoscopy and mammogram. I will check her blood cultures, inflammatory markers, HIV, Hepatitis C, ANA, RF , and LDH. Recommend that she monitor her temperatures through temperature log. We will follow up once results are obtained.       Relevant Orders   CBC w/Diff   Culture, blood (single)   Culture, blood (single)   HIV antibody (with reflex)   CMV abs, IgG+IgM (cytomegalovirus)   Lactate Dehydrogenase (LDH)   C-reactive protein   Sedimentation rate   Rheumatoid Factor   Antinuclear Antib (ANA)   QuantiFERON-TB Gold Plus    Other Visit Diagnoses    Other problems related to lifestyle       Relevant Orders   Hepatitis C Antibody       I have discontinued Gloris Crystal's ciprofloxacin and phenazopyridine. I am also having her maintain her benazepril-hydrochlorthiazide, ibuprofen, and acetaminophen.   Follow-up: Return in about 3 weeks (around 02/27/2018), or if symptoms worsen or fail to improve.  Mauricio Po, [facility]

## 2018-02-07 LAB — QUANTIFERON-TB GOLD PLUS
MITOGEN-NIL: 6.46 [IU]/mL
NIL: 0.02 IU/mL
QUANTIFERON-TB GOLD PLUS: NEGATIVE
TB1-NIL: 0 [IU]/mL
TB2-NIL: 0 IU/mL

## 2018-02-08 ENCOUNTER — Encounter: Payer: Self-pay | Admitting: Family

## 2018-02-09 ENCOUNTER — Other Ambulatory Visit: Payer: Self-pay | Admitting: Family

## 2018-02-09 ENCOUNTER — Encounter: Payer: Self-pay | Admitting: Family

## 2018-02-09 DIAGNOSIS — R509 Fever, unspecified: Secondary | ICD-10-CM

## 2018-02-09 LAB — CMV ABS, IGG+IGM (CYTOMEGALOVIRUS)
CMV IgM: <30 AU/mL
Cytomegalovirus Ab-IgG: <0.6 U/mL

## 2018-02-09 LAB — CBC WITH DIFFERENTIAL/PLATELET
BASOS ABS: 104 {cells}/uL (ref 0–200)
Basophils Relative: 0.6 %
Eosinophils Absolute: 104 cells/uL (ref 15–500)
Eosinophils Relative: 0.6 %
HEMATOCRIT: 32.2 % — AB (ref 35.0–45.0)
Hemoglobin: 10.9 g/dL — ABNORMAL LOW (ref 11.7–15.5)
LYMPHS ABS: 1723 {cells}/uL (ref 850–3900)
MCH: 27.6 pg (ref 27.0–33.0)
MCHC: 33.9 g/dL (ref 32.0–36.0)
MCV: 81.5 fL (ref 80.0–100.0)
MONOS PCT: 5.6 %
MPV: 10.1 fL (ref 7.5–12.5)
NEUTROS PCT: 83.3 %
Neutro Abs: 14494 cells/uL — ABNORMAL HIGH (ref 1500–7800)
PLATELETS: 535 10*3/uL — AB (ref 140–400)
RBC: 3.95 10*6/uL (ref 3.80–5.10)
RDW: 13.4 % (ref 11.0–15.0)
TOTAL LYMPHOCYTE: 9.9 %
WBC mixed population: 974 cells/uL — ABNORMAL HIGH (ref 200–950)
WBC: 17.4 10*3/uL — AB (ref 3.8–10.8)

## 2018-02-09 LAB — ANA: Anti Nuclear Antibody(ANA): NEGATIVE

## 2018-02-09 LAB — HEPATITIS C ANTIBODY
HEP C AB: NONREACTIVE
SIGNAL TO CUT-OFF: 0.03 (ref <1.00)

## 2018-02-09 LAB — HIV ANTIBODY (ROUTINE TESTING W REFLEX): HIV 1&2 Ab, 4th Generation: NONREACTIVE

## 2018-02-09 LAB — SEDIMENTATION RATE: Sed Rate: 28 mm/h (ref 0–30)

## 2018-02-09 LAB — C-REACTIVE PROTEIN: CRP: 203.3 mg/L — AB (ref <8.0)

## 2018-02-09 LAB — LACTATE DEHYDROGENASE: LDH: 115 U/L — AB (ref 120–250)

## 2018-02-09 LAB — RHEUMATOID FACTOR

## 2018-02-11 LAB — CULTURE, BLOOD (SINGLE)
MICRO NUMBER: 90344813
MICRO NUMBER:: 90344810

## 2018-02-12 ENCOUNTER — Other Ambulatory Visit: Payer: 59

## 2018-02-12 ENCOUNTER — Encounter: Payer: Self-pay | Admitting: Family

## 2018-02-12 DIAGNOSIS — R509 Fever, unspecified: Secondary | ICD-10-CM | POA: Diagnosis not present

## 2018-02-13 LAB — CBC WITH DIFFERENTIAL/PLATELET
BASOS PCT: 0.6 %
Basophils Absolute: 116 cells/uL (ref 0–200)
EOS ABS: 272 {cells}/uL (ref 15–500)
Eosinophils Relative: 1.4 %
HCT: 30 % — ABNORMAL LOW (ref 35.0–45.0)
HEMOGLOBIN: 10 g/dL — AB (ref 11.7–15.5)
Lymphs Abs: 1494 cells/uL (ref 850–3900)
MCH: 27 pg (ref 27.0–33.0)
MCHC: 33.3 g/dL (ref 32.0–36.0)
MCV: 80.9 fL (ref 80.0–100.0)
MONOS PCT: 5.6 %
MPV: 9.7 fL (ref 7.5–12.5)
NEUTROS ABS: 16432 {cells}/uL — AB (ref 1500–7800)
Neutrophils Relative %: 84.7 %
Platelets: 601 10*3/uL — ABNORMAL HIGH (ref 140–400)
RBC: 3.71 10*6/uL — ABNORMAL LOW (ref 3.80–5.10)
RDW: 13.4 % (ref 11.0–15.0)
Total Lymphocyte: 7.7 %
WBC: 19.4 10*3/uL — ABNORMAL HIGH (ref 3.8–10.8)
WBCMIX: 1086 {cells}/uL — AB (ref 200–950)

## 2018-02-13 LAB — FERRITIN: Ferritin: 861 ng/mL — ABNORMAL HIGH (ref 10–232)

## 2018-02-13 LAB — PATHOLOGIST SMEAR REVIEW

## 2018-02-15 ENCOUNTER — Encounter: Payer: Self-pay | Admitting: Family

## 2018-02-16 ENCOUNTER — Ambulatory Visit: Payer: 59 | Admitting: Family

## 2018-02-19 ENCOUNTER — Other Ambulatory Visit: Payer: Self-pay | Admitting: Family

## 2018-02-19 DIAGNOSIS — R509 Fever, unspecified: Secondary | ICD-10-CM

## 2018-02-19 NOTE — Progress Notes (Unsigned)
C-

## 2018-02-20 ENCOUNTER — Encounter: Payer: Self-pay | Admitting: Family

## 2018-02-20 NOTE — Telephone Encounter (Signed)
Spoke with patient, advised it was a multistep process to get insurance approval, then scheduling.  She should hear from Angela [name] for scheduling. Caryl Pina will initiate prior authorization. Patient was glad for the update. Landis Gandy, RN

## 2018-02-27 ENCOUNTER — Encounter: Payer: Self-pay | Admitting: Family

## 2018-02-27 DIAGNOSIS — K36 Other appendicitis: Secondary | ICD-10-CM

## 2018-03-01 ENCOUNTER — Telehealth: Payer: Self-pay | Admitting: Hematology and Oncology

## 2018-03-01 NOTE — Telephone Encounter (Signed)
Left voicemail for patient regarding appt date/time/location/phone number

## 2018-03-07 ENCOUNTER — Ambulatory Visit
Admission: RE | Admit: 2018-03-07 | Discharge: 2018-03-07 | Disposition: A | Discharge status: Home / Self Care | Payer: 59 | Source: Ambulatory Visit | Attending: Family | Admitting: Family

## 2018-03-07 ENCOUNTER — Encounter: Payer: 59 | Admitting: Hematology and Oncology

## 2018-03-07 DIAGNOSIS — R10819 Abdominal tenderness, unspecified site: Secondary | ICD-10-CM | POA: Diagnosis not present

## 2018-03-07 DIAGNOSIS — R509 Fever, unspecified: Secondary | ICD-10-CM

## 2018-03-07 MED ORDER — IOPAMIDOL (ISOVUE-300) INJECTION 61%
100.0000 mL | Freq: Once | INTRAVENOUS | Status: AC | PRN
  Administered 2018-03-07: 100 mL via INTRAVENOUS

## 2018-03-08 ENCOUNTER — Observation Stay (HOSPITAL_COMMUNITY)
Admission: EM | Admit: 2018-03-08 | Discharge: 2018-03-09 | Disposition: A | Discharge status: Home / Self Care | Payer: 59 | Attending: Internal Medicine | Admitting: Internal Medicine

## 2018-03-08 ENCOUNTER — Encounter (HOSPITAL_COMMUNITY): Payer: Self-pay

## 2018-03-08 ENCOUNTER — Telehealth: Payer: Self-pay | Admitting: Family

## 2018-03-08 DIAGNOSIS — I10 Essential (primary) hypertension: Secondary | ICD-10-CM | POA: Diagnosis not present

## 2018-03-08 DIAGNOSIS — D72829 Elevated white blood cell count, unspecified: Secondary | ICD-10-CM | POA: Insufficient documentation

## 2018-03-08 DIAGNOSIS — I700 Atherosclerosis of aorta: Secondary | ICD-10-CM | POA: Diagnosis not present

## 2018-03-08 DIAGNOSIS — D649 Anemia, unspecified: Secondary | ICD-10-CM | POA: Diagnosis not present

## 2018-03-08 DIAGNOSIS — E785 Hyperlipidemia, unspecified: Secondary | ICD-10-CM | POA: Diagnosis not present

## 2018-03-08 DIAGNOSIS — K651 Peritoneal abscess: Secondary | ICD-10-CM | POA: Insufficient documentation

## 2018-03-08 DIAGNOSIS — Z79899 Other long term (current) drug therapy: Secondary | ICD-10-CM | POA: Insufficient documentation

## 2018-03-08 DIAGNOSIS — R918 Other nonspecific abnormal finding of lung field: Secondary | ICD-10-CM | POA: Insufficient documentation

## 2018-03-08 DIAGNOSIS — N289 Disorder of kidney and ureter, unspecified: Secondary | ICD-10-CM | POA: Diagnosis not present

## 2018-03-08 DIAGNOSIS — Z87891 Personal history of nicotine dependence: Secondary | ICD-10-CM | POA: Insufficient documentation

## 2018-03-08 DIAGNOSIS — D259 Leiomyoma of uterus, unspecified: Secondary | ICD-10-CM | POA: Insufficient documentation

## 2018-03-08 DIAGNOSIS — K5730 Diverticulosis of large intestine without perforation or abscess without bleeding: Secondary | ICD-10-CM | POA: Diagnosis not present

## 2018-03-08 DIAGNOSIS — Z9049 Acquired absence of other specified parts of digestive tract: Secondary | ICD-10-CM | POA: Diagnosis not present

## 2018-03-08 DIAGNOSIS — Z880 Allergy status to penicillin: Secondary | ICD-10-CM | POA: Insufficient documentation

## 2018-03-08 DIAGNOSIS — J9811 Atelectasis: Secondary | ICD-10-CM | POA: Insufficient documentation

## 2018-03-08 DIAGNOSIS — Z8249 Family history of ischemic heart disease and other diseases of the circulatory system: Secondary | ICD-10-CM | POA: Insufficient documentation

## 2018-03-08 DIAGNOSIS — R509 Fever, unspecified: Secondary | ICD-10-CM | POA: Diagnosis not present

## 2018-03-08 DIAGNOSIS — E876 Hypokalemia: Secondary | ICD-10-CM | POA: Diagnosis not present

## 2018-03-08 DIAGNOSIS — R1031 Right lower quadrant pain: Secondary | ICD-10-CM | POA: Insufficient documentation

## 2018-03-08 DIAGNOSIS — R188 Other ascites: Secondary | ICD-10-CM | POA: Diagnosis not present

## 2018-03-08 HISTORY — DX: Other ascites: R18.8

## 2018-03-08 LAB — CBC WITH DIFFERENTIAL/PLATELET
BASOS ABS: 0.1 10*3/uL (ref 0.0–0.1)
Basophils Relative: 1 %
EOS PCT: 4 %
Eosinophils Absolute: 0.4 10*3/uL (ref 0.0–0.7)
HEMATOCRIT: 33.1 % — AB (ref 36.0–46.0)
HEMOGLOBIN: 10.6 g/dL — AB (ref 12.0–15.0)
LYMPHS ABS: 1.9 10*3/uL (ref 0.7–4.0)
LYMPHS PCT: 22 %
MCH: 27.2 pg (ref 26.0–34.0)
MCHC: 32 g/dL (ref 30.0–36.0)
MCV: 84.9 fL (ref 78.0–100.0)
Monocytes Absolute: 0.6 10*3/uL (ref 0.1–1.0)
Monocytes Relative: 7 %
NEUTROS ABS: 5.9 10*3/uL (ref 1.7–7.7)
Neutrophils Relative %: 66 %
PLATELETS: 400 10*3/uL (ref 150–400)
RBC: 3.9 MIL/uL (ref 3.87–5.11)
RDW: 15.7 % — ABNORMAL HIGH (ref 11.5–15.5)
WBC: 8.9 10*3/uL (ref 4.0–10.5)

## 2018-03-08 LAB — COMPREHENSIVE METABOLIC PANEL
ALBUMIN: 3.7 g/dL (ref 3.5–5.0)
ALT: 18 U/L (ref 14–54)
ANION GAP: 13 (ref 5–15)
AST: 16 U/L (ref 15–41)
Alkaline Phosphatase: 101 U/L (ref 38–126)
BUN: 15 mg/dL (ref 6–20)
CHLORIDE: 100 mmol/L — AB (ref 101–111)
CO2: 25 mmol/L (ref 22–32)
Calcium: 9.4 mg/dL (ref 8.9–10.3)
Creatinine, Ser: 1.05 mg/dL — ABNORMAL HIGH (ref 0.44–1.00)
GFR calc Af Amer: >60 mL/min (ref >60)
GFR, EST NON AFRICAN AMERICAN: 57 mL/min — AB (ref >60)
Glucose, Bld: 101 mg/dL — ABNORMAL HIGH (ref 65–99)
POTASSIUM: 3.3 mmol/L — AB (ref 3.5–5.1)
Sodium: 138 mmol/L (ref 135–145)
Total Bilirubin: 0.6 mg/dL (ref 0.3–1.2)
Total Protein: 7.3 g/dL (ref 6.5–8.1)

## 2018-03-08 LAB — URINALYSIS, ROUTINE W REFLEX MICROSCOPIC
Bilirubin Urine: NEGATIVE
Glucose, UA: NEGATIVE mg/dL
Hgb urine dipstick: NEGATIVE
KETONES UR: NEGATIVE mg/dL
LEUKOCYTES UA: NEGATIVE
NITRITE: NEGATIVE
PH: 6 (ref 5.0–8.0)
Protein, ur: NEGATIVE mg/dL
Specific Gravity, Urine: 1.008 (ref 1.005–1.030)

## 2018-03-08 LAB — PHOSPHORUS: Phosphorus: 4.2 mg/dL (ref 2.5–4.6)

## 2018-03-08 LAB — TSH: TSH: 0.785 u[IU]/mL (ref 0.350–4.500)

## 2018-03-08 LAB — MAGNESIUM: Magnesium: 2 mg/dL (ref 1.7–2.4)

## 2018-03-08 LAB — FERRITIN: FERRITIN: 353 ng/mL — AB (ref 11–307)

## 2018-03-08 LAB — IRON AND TIBC
Iron: 42 ug/dL (ref 28–170)
SATURATION RATIOS: 15 % (ref 10.4–31.8)
TIBC: 288 ug/dL (ref 250–450)
UIBC: 246 ug/dL

## 2018-03-08 LAB — VITAMIN B12: Vitamin B-12: 529 pg/mL (ref 180–914)

## 2018-03-08 LAB — FOLATE: Folate: 12.2 ng/mL (ref >5.9)

## 2018-03-08 LAB — RETICULOCYTES
RBC.: 3.77 MIL/uL — ABNORMAL LOW (ref 3.87–5.11)
RETIC CT PCT: 2.2 % (ref 0.4–3.1)
Retic Count, Absolute: 82.9 10*3/uL (ref 19.0–186.0)

## 2018-03-08 MED ORDER — SODIUM CHLORIDE 0.9 % IV BOLUS
1000.0000 mL | Freq: Once | INTRAVENOUS | Status: AC
  Administered 2018-03-08: 1000 mL via INTRAVENOUS

## 2018-03-08 MED ORDER — CIPROFLOXACIN IN D5W 400 MG/200ML IV SOLN
400.0000 mg | Freq: Two times a day (BID) | INTRAVENOUS | Status: DC
  Administered 2018-03-09: 400 mg via INTRAVENOUS
  Filled 2018-03-08 (×2): qty 200

## 2018-03-08 MED ORDER — ACETAMINOPHEN 325 MG PO TABS: 650.0000 mg | ORAL_TABLET | Freq: Four times a day (QID) | ORAL | Status: DC | PRN

## 2018-03-08 MED ORDER — ONDANSETRON HCL 4 MG/2ML IJ SOLN: 4.0000 mg | Freq: Four times a day (QID) | INTRAMUSCULAR | Status: DC | PRN

## 2018-03-08 MED ORDER — ONDANSETRON HCL 4 MG PO TABS: 4.0000 mg | ORAL_TABLET | Freq: Four times a day (QID) | ORAL | Status: DC | PRN

## 2018-03-08 MED ORDER — SODIUM CHLORIDE 0.9% FLUSH
3.0000 mL | Freq: Two times a day (BID) | INTRAVENOUS | Status: DC
  Administered 2018-03-09: 3 mL via INTRAVENOUS

## 2018-03-08 MED ORDER — HYDRALAZINE HCL 20 MG/ML IJ SOLN: 10.0000 mg | Freq: Four times a day (QID) | INTRAMUSCULAR | Status: DC | PRN

## 2018-03-08 MED ORDER — MORPHINE SULFATE (PF) 4 MG/ML IV SOLN: 1.0000 mg | INTRAVENOUS | Status: DC | PRN

## 2018-03-08 MED ORDER — ENOXAPARIN SODIUM 40 MG/0.4ML ~~LOC~~ SOLN
40.0000 mg | SUBCUTANEOUS | Status: DC
  Filled 2018-03-08: qty 0.4

## 2018-03-08 MED ORDER — KCL IN DEXTROSE-NACL 20-5-0.9 MEQ/L-%-% IV SOLN
INTRAVENOUS | Status: DC
  Administered 2018-03-08 – 2018-03-09 (×3): via INTRAVENOUS
  Filled 2018-03-08 (×2): qty 1000

## 2018-03-08 MED ORDER — TRAMADOL HCL 50 MG PO TABS: 50.0000 mg | ORAL_TABLET | Freq: Four times a day (QID) | ORAL | Status: DC | PRN

## 2018-03-08 MED ORDER — ACETAMINOPHEN 650 MG RE SUPP: 650.0000 mg | Freq: Four times a day (QID) | RECTAL | Status: DC | PRN

## 2018-03-08 MED ORDER — METRONIDAZOLE IN NACL 5-0.79 MG/ML-% IV SOLN
500.0000 mg | Freq: Once | INTRAVENOUS | Status: AC
  Administered 2018-03-08: 500 mg via INTRAVENOUS
  Filled 2018-03-08: qty 100

## 2018-03-08 MED ORDER — METRONIDAZOLE IN NACL 5-0.79 MG/ML-% IV SOLN
500.0000 mg | Freq: Three times a day (TID) | INTRAVENOUS | Status: DC
  Administered 2018-03-08 – 2018-03-09 (×2): 500 mg via INTRAVENOUS
  Filled 2018-03-08 (×4): qty 100

## 2018-03-08 MED ORDER — CIPROFLOXACIN IN D5W 400 MG/200ML IV SOLN: 400.0000 mg | Freq: Two times a day (BID) | INTRAVENOUS | Status: DC

## 2018-03-08 MED ORDER — BOOST / RESOURCE BREEZE PO LIQD CUSTOM
1.0000 | Freq: Three times a day (TID) | ORAL | Status: DC
  Administered 2018-03-09: 1 via ORAL

## 2018-03-08 MED ORDER — CIPROFLOXACIN IN D5W 400 MG/200ML IV SOLN
400.0000 mg | Freq: Once | INTRAVENOUS | Status: AC
  Administered 2018-03-08: 400 mg via INTRAVENOUS
  Filled 2018-03-08: qty 200

## 2018-03-08 MED ORDER — KETOROLAC TROMETHAMINE 15 MG/ML IJ SOLN: 15.0000 mg | Freq: Four times a day (QID) | INTRAMUSCULAR | Status: DC | PRN

## 2018-03-08 NOTE — Consult Note (Signed)
    Angela [name] 07/24/1956  [id number].    Requesting MD: Dr. Melanie Belfi Chief Complaint/Reason for Consult: fevers, RLQ inflammatory process  HPI:  This is a 61 yo white female with no significant PMH who has been having fevers of unknown origin since December.  She was initially treated for PNA but fevers persisted despite abx therapy.  She has been noted to have an elevated WBC and neutrophil count for the last several months as well.  She was referred to heme/onc who did further testing.  This was all negative for EBV, HIV, Hepatitis, etc.  Her fevers have continued to persist between 99-104 at times.  She states she has had some intermittent abdominal pain, but nothing significant enough to make her remember to tell a provider.  She admits to at times losing her appetite, but then other times like this past week, she has been having a wonderful appetite.   She was sent for a CT scan of her abdomen/pel yesterday because of some abdominal pain.  This revealed "1. Inflammatory process in the right lower quadrant, centered within the mesentery just medial to the cecum, with wall thickening of the cecum and terminal ileum. Given that no normal appendix is identified, findings are concerning for chronic, ruptured appendicitis with phlegmonous change. The differential diagnosis also includes inflammatory bowel disease, although this is thought to be less likely. No discrete drainable fluid collection. 2. Minimal patchy centrilobular nodularity in the left lower lobe is likely infectious or inflammatory."  She was then referred to the [facility] where we have been called to see her.  Her labs today are incidentally enough, normal.  ROS: ROS : Please see HPI, otherwise all other systems are currently negative  Family History  Problem Relation Age of Onset  . Breast cancer Mother   . Esophageal cancer Father   . Leukemia Sister 5  . Breast cancer Maternal Aunt 49  . Breast cancer Maternal  Grandmother        dx in her late 60s  . Heart attack Paternal Grandfather   . Cervical cancer Sister     Past Medical History:  Diagnosis Date  . Family history of breast cancer   . Hyperlipidemia   . Hypertension     Past Surgical History:  Procedure Laterality Date  . CESAREAN SECTION    . KNEE SURGERY Right   . SPINE SURGERY     Herniated Disc    Social History:  reports that she has quit smoking. She quit after 20.00 years of use. She has never used smokeless tobacco. She reports that she does not drink alcohol or use drugs.  Allergies:  Allergies  Allergen Reactions  . Penicillins Hives    Details unknown per pt     (Not in a hospital admission)   Physical Exam: Blood pressure 119/73, pulse 77, temperature 97.7 F (36.5 C), resp. rate 16, SpO2 100 %. General: pleasant, WD, WN white female who is laying in bed in NAD HEENT: head is normocephalic, atraumatic.  Sclera are noninjected.  PERRL.  Ears and nose without any masses or lesions.  Mouth is pink and moist Heart: regular, rate, and rhythm.  Normal s1,s2. No obvious murmurs, gallops, or rubs noted.  Palpable radial and pedal pulses bilaterally Lungs: CTAB, no wheezes, rhonchi, or rales noted.  Respiratory effort nonlabored Abd: soft, tender in RLQ, but no guarding or rebounding, ND, +BS, no masses, hernias, or organomegaly MS: all 4 extremities are symmetrical with no   cyanosis, clubbing, or edema. Skin: warm and dry with no masses, lesions, or rashes Psych: A&Ox3 with an appropriate affect.   Results for orders placed or performed during the hospital encounter of 03/08/18 (from the past 48 hour(s))  Comprehensive metabolic panel     Status: Abnormal   Collection Time: 03/08/18  2:21 PM  Result Value Ref Range   Sodium 138 135 - 145 mmol/L   Potassium 3.3 (L) 3.5 - 5.1 mmol/L   Chloride 100 (L) 101 - 111 mmol/L   CO2 25 22 - 32 mmol/L   Glucose, Bld 101 (H) 65 - 99 mg/dL   BUN 15 6 - 20 mg/dL    Creatinine, Ser 1.05 (H) 0.44 - 1.00 mg/dL   Calcium 9.4 8.9 - 10.3 mg/dL   Total Protein 7.3 6.5 - 8.1 g/dL   Albumin 3.7 3.5 - 5.0 g/dL   AST 16 15 - 41 U/L   ALT 18 14 - 54 U/L   Alkaline Phosphatase 101 38 - 126 U/L   Total Bilirubin 0.6 0.3 - 1.2 mg/dL   GFR calc non Af Amer 57 (L) >60 mL/min   GFR calc Af Amer >60 >60 mL/min    Comment: (NOTE) The eGFR has been calculated using the CKD EPI equation. This calculation has not been validated in all clinical situations. eGFR's persistently <60 mL/min signify possible Chronic Kidney Disease.    Anion gap 13 5 - 15    Comment: Performed at [facility], [address] [place], [place] [address]  CBC with Differential     Status: Abnormal   Collection Time: 03/08/18  2:21 PM  Result Value Ref Range   WBC 8.9 4.0 - 10.5 K/uL   RBC 3.90 3.87 - 5.11 MIL/uL   Hemoglobin 10.6 (L) 12.0 - 15.0 g/dL   HCT 33.1 (L) 36.0 - 46.0 %   MCV 84.9 78.0 - 100.0 fL   MCH 27.2 26.0 - 34.0 pg   MCHC 32.0 30.0 - 36.0 g/dL   RDW 15.7 (H) 11.5 - 15.5 %   Platelets 400 150 - 400 K/uL   Neutrophils Relative % 66 %   Neutro Abs 5.9 1.7 - 7.7 K/uL   Lymphocytes Relative 22 %   Lymphs Abs 1.9 0.7 - 4.0 K/uL   Monocytes Relative 7 %   Monocytes Absolute 0.6 0.1 - 1.0 K/uL   Eosinophils Relative 4 %   Eosinophils Absolute 0.4 0.0 - 0.7 K/uL   Basophils Relative 1 %   Basophils Absolute 0.1 0.0 - 0.1 K/uL    Comment: Performed at [facility], [address] [place], [place] [address]  Urinalysis, Routine w reflex microscopic     Status: Abnormal   Collection Time: 03/08/18  2:27 PM  Result Value Ref Range   Color, Urine STRAW (A) YELLOW   APPearance CLEAR CLEAR   Specific Gravity, Urine 1.008 1.005 - 1.030   pH 6.0 5.0 - 8.0   Glucose, UA NEGATIVE NEGATIVE mg/dL   Hgb urine dipstick NEGATIVE NEGATIVE   Bilirubin Urine NEGATIVE NEGATIVE   Ketones, ur NEGATIVE NEGATIVE mg/dL   Protein, ur NEGATIVE NEGATIVE mg/dL   Nitrite NEGATIVE  NEGATIVE   Leukocytes, UA NEGATIVE NEGATIVE    Comment: Performed at [facility], [address] [place],  [address]   Ct Abdomen Pelvis W Contrast  Result Date: 03/08/2018 CLINICAL DATA:  Fever of unknown origin for the past 4 months. Occasional abdominal tenderness. EXAM: CT ABDOMEN   AND PELVIS WITH CONTRAST TECHNIQUE: Multidetector CT imaging of the abdomen and pelvis was performed using the standard protocol following bolus administration of intravenous contrast. CONTRAST:  100mL ISOVUE-300 IOPAMIDOL (ISOVUE-300) INJECTION 61% COMPARISON:  None. FINDINGS: Lower chest: Minimal patchy centrilobular nodularity in the left lower lobe. Scarring/atelectasis in the lingula. Hepatobiliary: No focal liver abnormality is seen. Small amount of focal fat along the falciform ligament. Status post cholecystectomy. No biliary dilatation. Pancreas: Unremarkable. No pancreatic ductal dilatation or surrounding inflammatory changes. Spleen: Normal in size without focal abnormality. Adrenals/Urinary Tract: Adrenal glands are unremarkable. Kidneys are normal, without renal calculi, focal lesion, or hydronephrosis. Bladder is under distended. Stomach/Bowel: There is an inflammatory process/phlegmon in the right lower quadrant, centered within the mesentery just medial to the cecum. The phlegmon contains a few small foci air. There is wall thickening of the cecum and terminal ileum, as well as the nearby sigmoid colon. A normal appendix is not identified. Mild sigmoid diverticulosis. The stomach and small bowel are unremarkable. No bowel obstruction. Vascular/Lymphatic: Aortic atherosclerosis. No enlarged abdominal or pelvic lymph nodes. Reproductive: Small calcified uterine fibroid. The ovaries are unremarkable. Other: No abdominal wall hernia or abnormality. No abdominopelvic ascites. No pneumoperitoneum. Musculoskeletal: No acute or significant osseous findings. IMPRESSION: 1. Inflammatory process in the right  lower quadrant, centered within the mesentery just medial to the cecum, with wall thickening of the cecum and terminal ileum. Given that no normal appendix is identified, findings are concerning for chronic, ruptured appendicitis with phlegmonous change. The differential diagnosis also includes inflammatory bowel disease, although this is thought to be less likely. No discrete drainable fluid collection. 2. Minimal patchy centrilobular nodularity in the left lower lobe is likely infectious or inflammatory. 3.  Aortic atherosclerosis (ICD10-I70.0). These results will be called to the ordering clinician or representative by the Radiologist Assistant, and communication documented in the PACS or zVision Dashboard. Electronically Signed   By: William T Derry M.D.   On: 03/08/2018 08:56      Assessment/Plan RLQ inflammatory process This is an otherwise healthy female with a 4 month history of FUO who has been found to have a RLQ inflammatory process.  It is not known what this is from currently.  This could be chronically perforated appendicitis, but it could also be inflammatory bowel disease. She does not have a family history of this.  Either way, this process has been going on for 4 months and is not acute.  Her labs are normal today.  She does not need acute surgical intervention.  She needs further work up to determine what process is causing this picture in her RLQ.  We would recommend medical admission for bowel rest, abx therapy, and GI evaluation for colonoscopy.  We will follow along.    E , PA-C [facility] 03/08/2018, 4:34 PM Pager: [phone]  

## 2018-03-08 NOTE — Telephone Encounter (Signed)
Received CT Abdomen results with concern for chronic, ruptured appendicitis with phlegmonous changes. Spoke with patient regarding these results and will refer to general surgery for further evaluation and treatment.

## 2018-03-08 NOTE — H&P (Signed)
History and Physical    Angela [name] NOT:[id number] DOB: Jul 25, 1956 DOA: 03/08/2018  **Will admit patient based on the expectation that the patient will need hospitalization/ hospital care that crosses at least 2 midnights  PCP: Orpah Melter, MD   Attending physician: Lorin Mercy  Patient coming from/Resides with: Private residence  Chief Complaint: Abdominal pain with abnormal CT of abdomen and pelvis  HPI: Angela [name] is a 62 y.o. female with medical history significant for retention and dyslipidemia.  Patient was initially treated for pneumonia in early January and all symptoms resolved except for persistent fever and leukocytosis.  Since February she had noted vague right lower quadrant abdominal pain but did not consider this to be an issue noting she thought her appendix was on the left abdomen.  She has undergone extensive evaluation by her PCP and by ID with no cause found.  She was noted to be Epstein-Barr virus positive but all of her IgG's were positive and no indication of acute infection therefore ID did not suspect EBV as etiology to patient's persistent leukocytosis and fevers.  He eventually underwent CT of the abdomen and pelvis on 4/17 that resulted on 4/18.  Revealed an inflammatory process in the right lower quadrant centered within the mesentery just medial to the cecum with wall thickening of the cecum and terminal ileum.  A normal appendix was not identified and therefore radiologist was concerned over chronic ruptured appendicitis with phlegmonous change.  Other differential included inflammatory bowel disease although felt to be less likely.  There was no discrete drainable fluid collection.  Patient was referred to the ER.  Since arrival she has been normotensive, afebrile and non-hypoxemic.  She had no leukocytosis and did not have a left shift.  She had mild renal insufficiency with a creatinine of 1.05 baseline creatinine unknown.  Potassium was slightly low at 3.3 and she  was anemic with a hemoglobin of 10.6.  She was evaluated by surgery who felt she had no acute surgical needs and therefore recommendation was made for GI evaluation.  Formal GI evaluation by [facility] gastroenterology pending at time of dictation.  Has been given empiric Cipro and Flagyl IV.  ED Course:  Vital Signs: BP 104/64   Pulse (!) 56   Temp 97.7 F (36.5 C)   Resp 16   SpO2 97%  CT abdomen and pelvis: as above Lab data: As above Medications and treatments: NS bolus times 1 L, Flagyl 500 mg IV x1, Cipro 400 mg IV x1  Review of Systems:  In addition to the HPI above,  No Headache, changes with Vision or hearing, new weakness, tingling, numbness in any extremity, dizziness, dysarthria or word finding difficulty, gait disturbance or imbalance, tremors or seizure activity No problems swallowing food or Liquids, indigestion/reflux, choking or coughing while eating, abdominal pain with or after eating No Chest pain, Cough or Shortness of Breath, palpitations, orthopnea or DOE No N/V, melena,hematochezia, dark tarry stools, constipation No dysuria, malodorous urine, hematuria or flank pain No new skin rashes, lesions, masses or bruises, No new joint pains, aches, swelling or redness No recent unintentional weight gain or loss No polyuria, polydypsia or polyphagia   Past Medical History:  Diagnosis Date  . Family history of breast cancer   . Hyperlipidemia   . Hypertension     Past Surgical History:  Procedure Laterality Date  . CESAREAN SECTION    . KNEE SURGERY Right   . SPINE SURGERY     Herniated Disc  Social History   Socioeconomic History  . Marital status: Married    Spouse name: Not on file  . Number of children: 3  . Years of education: 48  . Highest education level: Not on file  Occupational History  . Not on file  Social Needs  . Financial resource strain: Not on file  . Food insecurity:    Worry: Not on file    Inability: Not on file  . Transportation  needs:    Medical: Not on file    Non-medical: Not on file  Tobacco Use  . Smoking status: Former Smoker    Years: 20.00  . Smokeless tobacco: Never Used  Substance and Sexual Activity  . Alcohol use: No  . Drug use: No  . Sexual activity: Not on file  Lifestyle  . Physical activity:    Days per week: Not on file    Minutes per session: Not on file  . Stress: Not on file  Relationships  . Social connections:    Talks on phone: Not on file    Gets together: Not on file    Attends religious service: Not on file    Active member of club or organization: Not on file    Attends meetings of clubs or organizations: Not on file    Relationship status: Not on file  . Intimate partner violence:    Fear of current or ex partner: Not on file    Emotionally abused: Not on file    Physically abused: Not on file    Forced sexual activity: Not on file  Other Topics Concern  . Not on file  Social History Narrative  . Not on file    Mobility: Independent Work history: Not obtained   Allergies  Allergen Reactions  . Penicillins Hives    Details unknown per pt    Family History  Problem Relation Age of Onset  . Breast cancer Mother   . Esophageal cancer Father   . Leukemia Sister 5  . Breast cancer Maternal Aunt 20  . Breast cancer Maternal Grandmother        dx in her late 72s  . Heart attack Paternal Grandfather   . Cervical cancer Sister       Prior to Admission medications   Medication Sig Start Date End Date Taking? Authorizing Provider  acetaminophen (TYLENOL) 325 MG tablet Take 650 mg by mouth every 4 (four) hours as needed for mild pain or headache.    Yes [provider]  HYDROCHLOROTHIAZIDE PO Take by mouth.   Yes [provider]  ibuprofen (ADVIL,MOTRIN) 400 MG tablet Take 400 mg by mouth every 4 (four) hours as needed for fever or headache.   Yes [provider]  UNKNOWN TO PATIENT Cholesterol   Yes [provider]     Physical Exam: Vitals:   03/08/18 1349 03/08/18 1700 03/08/18 1800  BP: 119/73 (!) 145/66 104/64  Pulse: 77 61 (!) 56  Resp: 16    Temp: 97.7 F (36.5 C)    SpO2: 100% 100% 97%      Constitutional: NAD, calm, comfortable Eyes: PERRL, lids and conjunctivae normal ENMT: Mucous membranes are moist. Posterior pharynx clear of any exudate or lesions.Normal dentition.  Neck: normal, supple, no masses, no thyromegaly Respiratory: clear to auscultation bilaterally, no wheezing, no crackles. Normal respiratory effort. No accessory muscle use.  Cardiovascular: Regular rate and rhythm, no murmurs / rubs / gallops. No extremity edema. 2+ pedal pulses. No carotid  bruits.  Abdomen: Focal RLQ tenderness light palpation noted with guarding but no rebounding, no masses palpated. No hepatosplenomegaly. Bowel sounds positive.  Musculoskeletal: no clubbing / cyanosis. No joint deformity upper and lower extremities. Good ROM, no contractures. Normal muscle tone.  Skin: no rashes, lesions, ulcers. No induration Neurologic: CN 2-12 grossly intact. Sensation intact, DTR normal. Strength 5/5 x all 4 extremities.  Psychiatric: Normal judgment and insight. Alert and oriented x 3. Normal mood.    Labs on Admission: I have personally reviewed following labs and imaging studies  CBC: Recent Labs  Lab 03/08/18 1421  WBC 8.9  NEUTROABS 5.9  HGB 10.6*  HCT 33.1*  MCV 84.9  PLT 478   Basic Metabolic Panel: Recent Labs  Lab 03/08/18 1421  NA 138  K 3.3*  CL 100*  CO2 25  GLUCOSE 101*  BUN 15  CREATININE 1.05*  CALCIUM 9.4   GFR: CrCl cannot be calculated (Unknown ideal weight.). Liver Function Tests: Recent Labs  Lab 03/08/18 1421  AST 16  ALT 18  ALKPHOS 101  BILITOT 0.6  PROT 7.3  ALBUMIN 3.7   No results for input(s): LIPASE, AMYLASE in the last 168 hours. No results for input(s): AMMONIA in the last 168 hours. Coagulation Profile: No results for input(s): INR, PROTIME in  the last 168 hours. Cardiac Enzymes: No results for input(s): CKTOTAL, CKMB, CKMBINDEX, TROPONINI in the last 168 hours. BNP (last 3 results) No results for input(s): PROBNP in the last 8760 hours. HbA1C: No results for input(s): HGBA1C in the last 72 hours. CBG: No results for input(s): GLUCAP in the last 168 hours. Lipid Profile: No results for input(s): CHOL, HDL, LDLCALC, TRIG, CHOLHDL, LDLDIRECT in the last 72 hours. Thyroid Function Tests: No results for input(s): TSH, T4TOTAL, FREET4, T3FREE, THYROIDAB in the last 72 hours. Anemia Panel: No results for input(s): VITAMINB12, FOLATE, FERRITIN, TIBC, IRON, RETICCTPCT in the last 72 hours. Urine analysis:    Component Value Date/Time   COLORURINE STRAW (A) 03/08/2018 1427   APPEARANCEUR CLEAR 03/08/2018 1427   LABSPEC 1.008 03/08/2018 1427   PHURINE 6.0 03/08/2018 1427   GLUCOSEU NEGATIVE 03/08/2018 1427   HGBUR NEGATIVE 03/08/2018 1427   BILIRUBINUR NEGATIVE 03/08/2018 1427   KETONESUR NEGATIVE 03/08/2018 1427   PROTEINUR NEGATIVE 03/08/2018 1427   UROBILINOGEN 1.0 05/29/2014 0010   NITRITE NEGATIVE 03/08/2018 1427   LEUKOCYTESUR NEGATIVE 03/08/2018 1427   Sepsis Labs: @LABRCNTIP (procalcitonin:4,lacticidven:4) )No results found for this or any previous visit (from the past 240 hour(s)).   Radiological Exams on Admission: Ct Abdomen Pelvis W Contrast  Result Date: 03/08/2018 CLINICAL DATA:  Fever of unknown origin for the past 4 months. Occasional abdominal tenderness. EXAM: CT ABDOMEN AND PELVIS WITH CONTRAST TECHNIQUE: Multidetector CT imaging of the abdomen and pelvis was performed using the standard protocol following bolus administration of intravenous contrast. CONTRAST:  1102m ISOVUE-300 IOPAMIDOL (ISOVUE-300) INJECTION 61% COMPARISON:  None. FINDINGS: Lower chest: Minimal patchy centrilobular nodularity in the left lower lobe. Scarring/atelectasis in the lingula. Hepatobiliary: No focal liver abnormality is seen.  Small amount of focal fat along the falciform ligament. Status post cholecystectomy. No biliary dilatation. Pancreas: Unremarkable. No pancreatic ductal dilatation or surrounding inflammatory changes. Spleen: Normal in size without focal abnormality. Adrenals/Urinary Tract: Adrenal glands are unremarkable. Kidneys are normal, without renal calculi, focal lesion, or hydronephrosis. Bladder is under distended. Stomach/Bowel: There is an inflammatory process/phlegmon in the right lower quadrant, centered within the mesentery just medial to the cecum. The phlegmon contains a few  small foci air. There is wall thickening of the cecum and terminal ileum, as well as the nearby sigmoid colon. A normal appendix is not identified. Mild sigmoid diverticulosis. The stomach and small bowel are unremarkable. No bowel obstruction. Vascular/Lymphatic: Aortic atherosclerosis. No enlarged abdominal or pelvic lymph nodes. Reproductive: Small calcified uterine fibroid. The ovaries are unremarkable. Other: No abdominal wall hernia or abnormality. No abdominopelvic ascites. No pneumoperitoneum. Musculoskeletal: No acute or significant osseous findings. IMPRESSION: 1. Inflammatory process in the right lower quadrant, centered within the mesentery just medial to the cecum, with wall thickening of the cecum and terminal ileum. Given that no normal appendix is identified, findings are concerning for chronic, ruptured appendicitis with phlegmonous change. The differential diagnosis also includes inflammatory bowel disease, although this is thought to be less likely. No discrete drainable fluid collection. 2. Minimal patchy centrilobular nodularity in the left lower lobe is likely infectious or inflammatory. 3.  Aortic atherosclerosis (ICD10-I70.0). These results will be called to the ordering clinician or representative by the Radiologist Assistant, and communication documented in the PACS or zVision Dashboard. Electronically Signed   By:  Titus Dubin M.D.   On: 03/08/2018 08:56     Assessment/Plan Principal Problem:   Intra-abdominal phelgmon (RLQ) -Patient with FUO since January 2019 who subsequently has been experiencing mild right lower quadrant abdominal pain for at least 3 months with CT findings concerning for either inflammatory bowel disease or chronic, ruptured appendix -Surgery following in consultation and has identified no acute surgical needs -GI consultation pending -Continue Cipro/Flagyl IV -D5NS w/ KCL at 100 cc/hr -NPO until evaluated by GI (?  Colonoscopy vs virtual colonoscopy) -Unable to utilize NSAIDs for pain given mild renal insufficiency -Tylenol for fever and mild pain -Morphine IV for severe pain -WBC count in March was 17,401-week later 19,400 both times with left shift current WBC normal with normal differential -He is currently afebrile  Active Problems:   Acute hypokalemia -Due to preadmission thiazide diuretic-hold -IV replacement -Follow labs    Mild renal insufficiency -Secondary to preadmission thiazide diuretic concomitant use of NSAIDs -Hold NSAIDs -IV fluid hydration as above -Follow labs    Hypertension -Currently controlled -Hydralazine IV prn    Anemia -Obtain anemia panel and TSH    Hyperlipidemia -Not on pharmacotherapy prior to admission    **Additional lab, imaging and/or diagnostic evaluation at discretion of supervising physician  DVT prophylaxis: Lovenox Code Status: Full Family Communication: Family at bedside Disposition Plan: Home Consults called: Surgery/[facility]; GI/[facility]    Samella Parr ANP-BC [facility] Pager [phone]   If 7PM-7AM, please contact night-coverage [email or web address] Password [facility]  03/08/2018, 6:12 PM

## 2018-03-08 NOTE — Telephone Encounter (Signed)
Spoke with Angela [name] surgery who recommend she proceed to the ED as she may require surgery or possible drain. Patient advised of information and will proceed to the ED.

## 2018-03-08 NOTE — ED Provider Notes (Signed)
[facility] EMERGENCY DEPARTMENT Provider Note   CSN: [id number] Arrival date & time: 03/08/18  1330     History   Chief Complaint No chief complaint on file.   HPI Angela [name] is a 62 y.o. female with a past medical history of hypertension, hyperlipidemia, who presents to ED for evaluation of abnormal CT scan findings yesterday.  Patient states that CT of the abdomen and pelvis was done yesterday which showed "chronic ruptured appendicitis."  She has been experiencing fever with T-max 104 for the past 4 months and vague abdominal pain.  I have been monitoring her lab work but were unable to find the source of possible infection.  However, she began complaining of right lower quadrant abdominal pain so CT scan was done.  Last meal was 3 hours ago.  Denies any nausea, vomiting, bowel changes, dysuria, hematuria, back pain.  HPI  Past Medical History:  Diagnosis Date  . Family history of breast cancer   . Hyperlipidemia   . Hypertension     Patient Active Problem List   Diagnosis Date Noted  . Fever 02/06/2018  . Genetic testing 11/23/2017  . Family history of breast cancer     Past Surgical History:  Procedure Laterality Date  . CESAREAN SECTION    . KNEE SURGERY Right   . SPINE SURGERY     Herniated Disc     OB History   None      Home Medications    Prior to Admission medications   Medication Sig Start Date End Date Taking? Authorizing Provider  acetaminophen (TYLENOL) 325 MG tablet Take 650 mg by mouth every 4 (four) hours as needed for mild pain or headache.    Yes [provider]  HYDROCHLOROTHIAZIDE PO Take by mouth.   Yes [provider]  ibuprofen (ADVIL,MOTRIN) 400 MG tablet Take 400 mg by mouth every 4 (four) hours as needed for fever or headache.   Yes [provider]  UNKNOWN TO PATIENT Cholesterol   Yes [provider]    Family History Family History  Problem Relation Age of Onset  . Breast  cancer Mother   . Esophageal cancer Father   . Leukemia Sister 5  . Breast cancer Maternal Aunt 66  . Breast cancer Maternal Grandmother        dx in her late 34s  . Heart attack Paternal Grandfather   . Cervical cancer Sister     Social History Social History   Tobacco Use  . Smoking status: Former Smoker    Years: 20.00  . Smokeless tobacco: Never Used  Substance Use Topics  . Alcohol use: No  . Drug use: No     Allergies   Penicillins   Review of Systems Review of Systems  Constitutional: Positive for fever. Negative for appetite change and chills.  HENT: Negative for ear pain, rhinorrhea, sneezing and sore throat.   Eyes: Negative for photophobia and visual disturbance.  Respiratory: Negative for cough, chest tightness, shortness of breath and wheezing.   Cardiovascular: Negative for chest pain and palpitations.  Gastrointestinal: Positive for abdominal pain. Negative for blood in stool, constipation, diarrhea, nausea and vomiting.  Genitourinary: Negative for dysuria, hematuria and urgency.  Musculoskeletal: Negative for myalgias.  Skin: Negative for rash.  Neurological: Negative for dizziness, weakness and light-headedness.     Physical Exam Updated Vital Signs BP 119/73 (BP Location: Right Arm)   Pulse 77   Temp 97.7 F (36.5 C)   Resp  16   SpO2 100%   Physical Exam  Constitutional: She appears well-developed and well-nourished. No distress.  Nontoxic appearing in no acute distress.  HENT:  Head: Normocephalic and atraumatic.  Nose: Nose normal.  Eyes: Conjunctivae and EOM are normal. Right eye exhibits no discharge. Left eye exhibits no discharge. No scleral icterus.  Neck: Normal range of motion. Neck supple.  Cardiovascular: Normal rate, regular rhythm, normal heart sounds and intact distal pulses. Exam reveals no gallop and no friction rub.  No murmur heard. Pulmonary/Chest: Effort normal and breath sounds normal. No respiratory distress.    Abdominal: Soft. Bowel sounds are normal. She exhibits no distension. There is tenderness. There is no rebound and no guarding.  Mild RLQ abd pain, with no rebound or guarding present.  Musculoskeletal: Normal range of motion. She exhibits no edema.  Neurological: She is alert. She exhibits normal muscle tone. Coordination normal.  Skin: Skin is warm and dry. No rash noted.  Psychiatric: She has a normal mood and affect.  Nursing note and vitals reviewed.    ED Treatments / Results  Labs (all labs ordered are listed, but only abnormal results are displayed) Labs Reviewed  URINALYSIS, ROUTINE W REFLEX MICROSCOPIC - Abnormal; Notable for the following components:      Result Value   Color, Urine STRAW (*)    All other components within normal limits  COMPREHENSIVE METABOLIC PANEL - Abnormal; Notable for the following components:   Potassium 3.3 (*)    Chloride 100 (*)    Glucose, Bld 101 (*)    Creatinine, Ser 1.05 (*)    GFR calc non Af Amer 57 (*)    All other components within normal limits  CBC WITH DIFFERENTIAL/PLATELET - Abnormal; Notable for the following components:   Hemoglobin 10.6 (*)    HCT 33.1 (*)    RDW 15.7 (*)    All other components within normal limits  GASTROINTESTINAL PANEL BY PCR, STOOL (REPLACES STOOL CULTURE)  C DIFFICILE QUICK SCREEN W PCR REFLEX    EKG None  Radiology Ct Abdomen Pelvis W Contrast  Result Date: 03/08/2018 CLINICAL DATA:  Fever of unknown origin for the past 4 months. Occasional abdominal tenderness. EXAM: CT ABDOMEN AND PELVIS WITH CONTRAST TECHNIQUE: Multidetector CT imaging of the abdomen and pelvis was performed using the standard protocol following bolus administration of intravenous contrast. CONTRAST:  194m ISOVUE-300 IOPAMIDOL (ISOVUE-300) INJECTION 61% COMPARISON:  None. FINDINGS: Lower chest: Minimal patchy centrilobular nodularity in the left lower lobe. Scarring/atelectasis in the lingula. Hepatobiliary: No focal liver  abnormality is seen. Small amount of focal fat along the falciform ligament. Status post cholecystectomy. No biliary dilatation. Pancreas: Unremarkable. No pancreatic ductal dilatation or surrounding inflammatory changes. Spleen: Normal in size without focal abnormality. Adrenals/Urinary Tract: Adrenal glands are unremarkable. Kidneys are normal, without renal calculi, focal lesion, or hydronephrosis. Bladder is under distended. Stomach/Bowel: There is an inflammatory process/phlegmon in the right lower quadrant, centered within the mesentery just medial to the cecum. The phlegmon contains a few small foci air. There is wall thickening of the cecum and terminal ileum, as well as the nearby sigmoid colon. A normal appendix is not identified. Mild sigmoid diverticulosis. The stomach and small bowel are unremarkable. No bowel obstruction. Vascular/Lymphatic: Aortic atherosclerosis. No enlarged abdominal or pelvic lymph nodes. Reproductive: Small calcified uterine fibroid. The ovaries are unremarkable. Other: No abdominal wall hernia or abnormality. No abdominopelvic ascites. No pneumoperitoneum. Musculoskeletal: No acute or significant osseous findings. IMPRESSION: 1. Inflammatory process in the  right lower quadrant, centered within the mesentery just medial to the cecum, with wall thickening of the cecum and terminal ileum. Given that no normal appendix is identified, findings are concerning for chronic, ruptured appendicitis with phlegmonous change. The differential diagnosis also includes inflammatory bowel disease, although this is thought to be less likely. No discrete drainable fluid collection. 2. Minimal patchy centrilobular nodularity in the left lower lobe is likely infectious or inflammatory. 3.  Aortic atherosclerosis (ICD10-I70.0). These results will be called to the ordering clinician or representative by the Radiologist Assistant, and communication documented in the PACS or zVision Dashboard.  Electronically Signed   By: Titus Dubin M.D.   On: 03/08/2018 08:56    Procedures Procedures (including critical care time)  Medications Ordered in ED Medications  sodium chloride 0.9 % bolus 1,000 mL (1,000 mLs Intravenous New Bag/Given 03/08/18 1659)  ciprofloxacin (CIPRO) IVPB 400 mg (has no administration in time range)    And  metroNIDAZOLE (FLAGYL) IVPB 500 mg (500 mg Intravenous New Bag/Given 03/08/18 1659)     Initial Impression / Assessment and Plan / ED Course  I have reviewed the triage vital signs and the nursing notes.  Pertinent labs & imaging results that were available during my care of the patient were reviewed by me and considered in my medical decision making (see chart for details).  Clinical Course as of Mar 09 1735  Thu Mar 08, 2018  1532 Spoke to general surgery PA who will discuss with her attending and call us back.   [HK]  1608 WBC: 8.9 [HK]  1608 Temp: 97.7 F (36.5 C) [HK]  1620 General surgery PA seen patient. Will discuss with Dr. Brantley Stage and let us know plan.   [HK]  Leslie, PA spoke to Dr. Brantley Stage who recommends medicine admission, IV abx, GI consult for possible colonoscopy to evaluate for inflammatory process.   [HK]  3557 Hospitalist to admit. Waiting on consult from [facility] (spoke to [facility] who recommends IV abx, admission; but then said to contact [facility] since patient sees [facility] for primary care).   [HK]    Clinical Course User Index [HK] Delia Heady, PA-C    Patient presents to ED for abnormal CT scan findings.  Patient has been suffering from fever with T-max 104 with unknown origin for the past 4 months.  She does report some vague abdominal pain.  She has been treated in the past 4 months for possible URI, pneumonia.  She continues to have fever.  She was seen and evaluated by PCP several times during these 4 months.  Lab work significant for elevated leukocytosis at 17,000-19,000.  CT scan done yesterday shows inflammatory  process in the right lower quadrant with no identifiable appendix which could be concerning for chronic ruptured appendicitis with phlegmon.  Patient is afebrile here.  She is overall well-appearing and in no acute distress.  Mild abdominal tenderness to palpation without rebound or guarding present.  PCP contacted [facility] who recommended patient be evaluated in the ED.  Will contact general surgery for further management.  Spoke to [place], PA-C who spoke to Dr. Brantley Stage of general surgery.  They reviewed the CT scan findings and patient's lab work at this time which shows normal WBC count at 8.9 and normal neutrophil count.  She is afebrile here.  They suspect that this could possibly be inflammatory bowel disease.  Did not recommend acute surgical intervention at this time.  They recommend consult to gastroenterologist  and medicine admission for IV antibiotics and possible colonoscopy. Will consult GI, hospitalist.   Spoke to [facility] who recommends antibiotics and clear liquid diet for today.  They will consult tomorrow.   Appreciate the help of hospitalist, GI and surgery for management of patient.   Portions of this note were generated with Lobby[employer]. Dictation errors may occur despite best attempts at proofreading.  Final Clinical Impressions(s) / ED Diagnoses   Final diagnoses:  None    ED Discharge Orders    None       Delia Heady, PA-C 03/08/18 1745    Malvin Johns, MD 03/09/18 [phone]

## 2018-03-08 NOTE — Progress Notes (Signed)
@  2255 Pt received from ED. Alert and oriented x4. Denies c/o pain. Skin intact. Oriented to room and call bell.

## 2018-03-08 NOTE — ED Triage Notes (Signed)
Pt presents to ED after having abnormal CT scan yesterday that revealed chronic ruptured appendix. PT reports she has been having mild abdominal pain with fever for 4 months.

## 2018-03-08 NOTE — ED Notes (Signed)
Ordered meal tray

## 2018-03-09 ENCOUNTER — Encounter (HOSPITAL_COMMUNITY): Payer: Self-pay | Admitting: Gastroenterology

## 2018-03-09 DIAGNOSIS — R188 Other ascites: Secondary | ICD-10-CM | POA: Diagnosis not present

## 2018-03-09 DIAGNOSIS — R1011 Right upper quadrant pain: Secondary | ICD-10-CM | POA: Diagnosis not present

## 2018-03-09 DIAGNOSIS — R1031 Right lower quadrant pain: Secondary | ICD-10-CM | POA: Diagnosis not present

## 2018-03-09 DIAGNOSIS — R933 Abnormal findings on diagnostic imaging of other parts of digestive tract: Secondary | ICD-10-CM | POA: Diagnosis not present

## 2018-03-09 LAB — COMPREHENSIVE METABOLIC PANEL
ALK PHOS: 82 U/L (ref 38–126)
ALT: 16 U/L (ref 14–54)
AST: 16 U/L (ref 15–41)
Albumin: 2.9 g/dL — ABNORMAL LOW (ref 3.5–5.0)
Anion gap: 9 (ref 5–15)
BILIRUBIN TOTAL: 0.5 mg/dL (ref 0.3–1.2)
BUN: 10 mg/dL (ref 6–20)
CALCIUM: 8.5 mg/dL — AB (ref 8.9–10.3)
CO2: 22 mmol/L (ref 22–32)
CREATININE: 0.91 mg/dL (ref 0.44–1.00)
Chloride: 109 mmol/L (ref 101–111)
Glucose, Bld: 125 mg/dL — ABNORMAL HIGH (ref 65–99)
Potassium: 3.6 mmol/L (ref 3.5–5.1)
Sodium: 140 mmol/L (ref 135–145)
Total Protein: 5.8 g/dL — ABNORMAL LOW (ref 6.5–8.1)

## 2018-03-09 LAB — CBC
HCT: 29.9 % — ABNORMAL LOW (ref 36.0–46.0)
HEMOGLOBIN: 9.7 g/dL — AB (ref 12.0–15.0)
MCH: 27.6 pg (ref 26.0–34.0)
MCHC: 32.4 g/dL (ref 30.0–36.0)
MCV: 85.2 fL (ref 78.0–100.0)
Platelets: 338 10*3/uL (ref 150–400)
RBC: 3.51 MIL/uL — AB (ref 3.87–5.11)
RDW: 15.8 % — ABNORMAL HIGH (ref 11.5–15.5)
WBC: 7.3 10*3/uL (ref 4.0–10.5)

## 2018-03-09 MED ORDER — POTASSIUM CHLORIDE ER 10 MEQ PO TBCR: 10.0000 meq | EXTENDED_RELEASE_TABLET | Freq: Every day | ORAL | 0 refills | Status: DC

## 2018-03-09 MED ORDER — CIPROFLOXACIN HCL 500 MG PO TABS: 500.0000 mg | ORAL_TABLET | Freq: Two times a day (BID) | ORAL | 0 refills | Status: AC

## 2018-03-09 MED ORDER — METRONIDAZOLE 500 MG PO TABS: 500.0000 mg | ORAL_TABLET | Freq: Three times a day (TID) | ORAL | 0 refills | Status: AC

## 2018-03-09 NOTE — Discharge Summary (Signed)
Physician Discharge Summary  Patient ID: Angela [name] MRN: [id number] DOB/AGE: 01/15/1956 62 y.o.  Admit date: 03/08/2018 Discharge date: 03/09/2018  Admission Diagnoses:  Discharge Diagnoses:  Principal Problem:   Intra-abdominal phelgmon (RLQ) Active Problems: Possible ruptured appendicitis. Possible Crohn's disease Fever  Hypertension   Hyperlipidemia   Acute hypokalemia   Mild renal insufficiency   Anemia   Discharged Condition: stable  Hospital Course: Patient is a 62 year old Caucasian female with past medical history significant for hypertension and hyperlipidemia.   Patient presented with fever of unknown origin since January of this year.  Prior to admission, the patient also reported temperature of 101 to 104 F, abdominal pain that is mainly at the right lower quadrant area with associated leukocytosis.  CT scan of the abdomen and pelvis with contrast showed an apparent intrabdominal phlegmon that is thought to be related to chronic appendicitis.  Surgery saw the patient and requested GI evaluation for possible inflammatory bowel disease.  Patient was admitted for further assessment and management.  Patient was placed on observation.  Patient was kept n.p.o.  Patient was started on IV Cipro and Flagyl.  Fever has resolved.  No significant leukocytosis noted on admission.  GI consultant saw the patient, and has cleared patient for discharge.  Patient will follow with GI team in the office in 4 days for possible colonoscopy.  Meanwhile, will continue oral Cipro and Flagyl for now.  She will also follow with the surgical team.  Patient will follow with PCP within 1 week.  Patient is back to her baseline.  No reported symptoms.  Patient is eager to be discharged back home.  Consults: general surgery, GI  Significant Diagnostic Studies: radiology: CT scan abdomen and pelvis with contrast:    CLINICAL DATA:  Fever of unknown origin for the past 4 months. Occasional abdominal  tenderness.  EXAM: CT ABDOMEN AND PELVIS WITH CONTRAST  TECHNIQUE: Multidetector CT imaging of the abdomen and pelvis was performed using the standard protocol following bolus administration of intravenous contrast.  CONTRAST:  125m ISOVUE-300 IOPAMIDOL (ISOVUE-300) INJECTION 61%  COMPARISON:  None.  FINDINGS: Lower chest: Minimal patchy centrilobular nodularity in the left lower lobe. Scarring/atelectasis in the lingula.  Hepatobiliary: No focal liver abnormality is seen. Small amount of focal fat along the falciform ligament. Status post cholecystectomy. No biliary dilatation.  Pancreas: Unremarkable. No pancreatic ductal dilatation or surrounding inflammatory changes.  Spleen: Normal in size without focal abnormality.  Adrenals/Urinary Tract: Adrenal glands are unremarkable. Kidneys are normal, without renal calculi, focal lesion, or hydronephrosis. Bladder is under distended.  Stomach/Bowel: There is an inflammatory process/phlegmon in the right lower quadrant, centered within the mesentery just medial to the cecum. The phlegmon contains a few small foci air. There is wall thickening of the cecum and terminal ileum, as well as the nearby sigmoid colon. A normal appendix is not identified.  Mild sigmoid diverticulosis. The stomach and small bowel are unremarkable. No bowel obstruction.  Vascular/Lymphatic: Aortic atherosclerosis. No enlarged abdominal or pelvic lymph nodes.  Reproductive: Small calcified uterine fibroid. The ovaries are unremarkable.  Other: No abdominal wall hernia or abnormality. No abdominopelvic ascites. No pneumoperitoneum.  Musculoskeletal: No acute or significant osseous findings.  IMPRESSION: 1. Inflammatory process in the right lower quadrant, centered within the mesentery just medial to the cecum, with wall thickening of the cecum and terminal ileum. Given that no normal appendix is identified, findings are  concerning for chronic, ruptured appendicitis with phlegmonous change. The differential diagnosis also includes inflammatory bowel  disease, although this is thought to be less likely. No discrete drainable fluid collection. 2. Minimal patchy centrilobular nodularity in the left lower lobe is likely infectious or inflammatory. 3.  Aortic atherosclerosis (ICD10-I70.0).  These results will be called to the ordering clinician or representative by the Radiologist Assistant, and communication documented in the PACS or zVision Dashboard.  Discharge Exam: Blood pressure 106/68, pulse (!) 57, temperature 98.7 F (37.1 C), temperature source Oral, resp. rate 17, SpO2 100 %.   Disposition: Discharge disposition: 01-Home or Self Care  Discharge Instructions    Call MD for:   Complete by:  As directed    Please call MD if the symptoms worsen   Diet general   Complete by:  As directed    Increase activity slowly   Complete by:  As directed      Allergies as of 03/09/2018      Reactions   Penicillins Hives   Has patient had a PCN reaction causing immediate rash, facial/tongue/throat swelling, SOB or lightheadedness with hypotension: Yes Has patient had a PCN reaction causing severe rash involving mucus membranes or skin necrosis: No Has patient had a PCN reaction that required hospitalization: No Has patient had a PCN reaction occurring within the last 10 years: No If all of the above answers are "NO", then may proceed with Cephalosporin use.      Medication List    STOP taking these medications   acetaminophen 325 MG tablet Commonly known as:  TYLENOL   ibuprofen 200 MG tablet Commonly known as:  ADVIL,MOTRIN     TAKE these medications   atorvastatin 40 MG tablet Commonly known as:  LIPITOR Take 40 mg by mouth daily.   ciprofloxacin 500 MG tablet Commonly known as:  CIPRO Take 1 tablet (500 mg total) by mouth 2 (two) times daily for 10 days.    lisinopril-hydrochlorothiazide 20-25 MG tablet Commonly known as:  PRINZIDE,ZESTORETIC Take 1 tablet by mouth daily.   metroNIDAZOLE 500 MG tablet Commonly known as:  FLAGYL Take 1 tablet (500 mg total) by mouth 3 (three) times daily for 10 days.   potassium chloride 10 MEQ tablet Commonly known as:  K-DUR Take 1 tablet (10 mEq total) by mouth daily.        SignedBonnell Public 03/09/2018, 3:48 PM

## 2018-03-09 NOTE — Progress Notes (Signed)
Discharged instructions reviewed with Patient.  Pt instructed on where to pick up her 3 new prescriptions and when to take them.  Pt advised on when she took her last Antibxs. Pt has her belongings and leaving ambulatory with her husband.

## 2018-03-09 NOTE — Progress Notes (Signed)
Initial Nutrition Assessment  DOCUMENTATION CODES:   Not applicable  INTERVENTION:   -Continue Boost Breeze po TID, each supplement provides 250 kcal and 9 grams of protein -RD will follow for diet advancement and adjust supplement regimen as appropriate  NUTRITION DIAGNOSIS:   Inadequate oral intake related to altered GI function as evidenced by (clear liquid diet).  GOAL:   Patient will meet greater than or equal to 90% of their needs  MONITOR:   PO intake, Supplement acceptance, Diet advancement, Weight trends, Skin, Labs, I & O's  REASON FOR ASSESSMENT:   Malnutrition Screening Tool    ASSESSMENT:   Angela [name] is a 62 y.o. female with a Past Medical History of HTN and HLD who presents with FUO since January.    Pt admitted with intra-abdominal phegmon.   Spoke with pt at bedside, who reports great appetite presently and PTA. Observed breakfast tray- pt consumed 100% of jello, 50% of broth, and 1/3 of Boost Breeze supplement. She reports she typically consumes 3 meals per day, but often does not eat a lot of fruits and vegetables. Pt is very hungry, but understands rationale for clear liquid diet.   Pt reports her wt fluctuates at baseline- typicvally between 155-165#. She denies changes in activity level or how her clothing fits.   Discussed importance of good meal and supplement intake to promote healing.   Case discussed with RN. RD provided pt with ice pop and soda per her request; pt reports she is extremely hungry at this time. Pt to remain on clear liquids pending GI evaluation in anticipation for further testing.  Labs reviewed.   NUTRITION - FOCUSED PHYSICAL EXAM:    Most Recent Value  Orbital Region  No depletion  Upper Arm Region  No depletion  Thoracic and Lumbar Region  No depletion  Buccal Region  No depletion  Temple Region  No depletion  Clavicle Bone Region  No depletion  Clavicle and Acromion Bone Region  No depletion  Scapular Bone Region   No depletion  Dorsal Hand  No depletion  Patellar Region  No depletion  Anterior Thigh Region  No depletion  Posterior Calf Region  No depletion  Edema (RD Assessment)  None  Hair  Reviewed  Eyes  Reviewed  Mouth  Reviewed  Skin  Reviewed  Nails  Reviewed       Diet Order:  Diet clear liquid Room service appropriate? Yes; Fluid consistency: Thin  EDUCATION NEEDS:   Education needs have been addressed  Skin:  Skin Assessment: Reviewed RN Assessment  Last BM:  03/07/18  Height:   Ht Readings from Last 1 Encounters:  02/06/18 5' 5"  (1.651 m)    Weight:   Wt Readings from Last 1 Encounters:  02/06/18 154 lb 12 oz (70.2 kg)    Ideal Body Weight:  56.8 kg  BMI:  Estimated body mass index is 25.75 kg/m as calculated from the following:   Height as of 02/06/18: 5' 5"  (1.651 m).   Weight as of 02/06/18: 154 lb 12 oz (70.2 kg).   Estimated Nutritional Needs:   Kcal:  8366-2947  Protein:  85-100 grams  Fluid:  1.7-1.9 L    Katelinn Justice A. Jimmye Norman, RD, LDN, CDE Pager: [phone] After hours Pager: [phone]

## 2018-03-09 NOTE — Consult Note (Signed)
Reason for Consult:abnormal CT Referring Physician: Hospital team  Angela [name] is an 62 y.o. female.  HPI: patient seen and examined and her hospital computer chart reviewed and her office computer chart r eviewed including her last colonoscopy October 2014 and based on a right-sided polyp she was recommended to have another one in 3 years but interesting her ileocecal valve was erythematous but biopsies were okay but the TI was not entered and other than some mild right lower quadrant pain worse with pending a pump all driving the patient has had no GI symptoms but for 4 months has had episodic fevers and occasional increased white count and her infectious disease doctor ordered a CT scan which showed either Crohn's disease or perforated appendix and we are consulted for further workup and plans and the patient's GI family history is negativeand autoimmune problems run in the family and she's been moving her bowels okay and not seen any blood and denies any other complaints             Past Medical History:  Diagnosis Date  . Family history of breast cancer   . Hyperlipidemia   . Hypertension     Past Surgical History:  Procedure Laterality Date  . CESAREAN SECTION    . KNEE SURGERY Right   . SPINE SURGERY     Herniated Disc    Family History  Problem Relation Age of Onset  . Breast cancer Mother   . Esophageal cancer Father   . Leukemia Sister 5  . Breast cancer Maternal Aunt 38  . Breast cancer Maternal Grandmother        dx in her late 33s  . Heart attack Paternal Grandfather   . Cervical cancer Sister     Social History:  reports that she has quit smoking. She quit after 20.00 years of use. She has never used smokeless tobacco. She reports that she does not drink alcohol or use drugs.  Allergies:  Allergies  Allergen Reactions  . Penicillins Hives    Has patient had a PCN reaction causing immediate rash, facial/tongue/throat swelling, SOB or lightheadedness with  hypotension: Yes Has patient had a PCN reaction causing severe rash involving mucus membranes or skin necrosis: No Has patient had a PCN reaction that required hospitalization: No Has patient had a PCN reaction occurring within the last 10 years: No If all of the above answers are "NO", then may proceed with Cephalosporin use.     Medications: I have reviewed the patient's current medications.  Results for orders placed or performed during the hospital encounter of 03/08/18 (from the past 48 hour(s))  Comprehensive metabolic panel     Status: Abnormal   Collection Time: 03/08/18  2:21 PM  Result Value Ref Range   Sodium 138 135 - 145 mmol/L   Potassium 3.3 (L) 3.5 - 5.1 mmol/L   Chloride 100 (L) 101 - 111 mmol/L   CO2 25 22 - 32 mmol/L   Glucose, Bld 101 (H) 65 - 99 mg/dL   BUN 15 6 - 20 mg/dL   Creatinine, Ser 1.05 (H) 0.44 - 1.00 mg/dL   Calcium 9.4 8.9 - 10.3 mg/dL   Total Protein 7.3 6.5 - 8.1 g/dL   Albumin 3.7 3.5 - 5.0 g/dL   AST 16 15 - 41 U/L   ALT 18 14 - 54 U/L   Alkaline Phosphatase 101 38 - 126 U/L   Total Bilirubin 0.6 0.3 - 1.2 mg/dL   GFR calc non Af  Amer 57 (L) >60 mL/min   GFR calc Af Amer >60 >60 mL/min    Comment: (NOTE) The eGFR has been calculated using the CKD EPI equation. This calculation has not been validated in all clinical situations. eGFR's persistently <60 mL/min signify possible Chronic Kidney Disease.    Anion gap 13 5 - 15    Comment: Performed at [facility] [address], [place], [place] [address]  CBC with Differential     Status: Abnormal   Collection Time: 03/08/18  2:21 PM  Result Value Ref Range   WBC 8.9 4.0 - 10.5 K/uL   RBC 3.90 3.87 - 5.11 MIL/uL   Hemoglobin 10.6 (L) 12.0 - 15.0 g/dL   HCT 33.1 (L) 36.0 - 46.0 %   MCV 84.9 78.0 - 100.0 fL   MCH 27.2 26.0 - 34.0 pg   MCHC 32.0 30.0 - 36.0 g/dL   RDW 15.7 (H) 11.5 - 15.5 %   Platelets 400 150 - 400 K/uL   Neutrophils Relative % 66 %   Neutro Abs 5.9 1.7 - 7.7  K/uL   Lymphocytes Relative 22 %   Lymphs Abs 1.9 0.7 - 4.0 K/uL   Monocytes Relative 7 %   Monocytes Absolute 0.6 0.1 - 1.0 K/uL   Eosinophils Relative 4 %   Eosinophils Absolute 0.4 0.0 - 0.7 K/uL   Basophils Relative 1 %   Basophils Absolute 0.1 0.0 - 0.1 K/uL    Comment: Performed at [facility], [address] [place], [place] [address]  Urinalysis, Routine w reflex microscopic     Status: Abnormal   Collection Time: 03/08/18  2:27 PM  Result Value Ref Range   Color, Urine STRAW (A) YELLOW   APPearance CLEAR CLEAR   Specific Gravity, Urine 1.008 1.005 - 1.030   pH 6.0 5.0 - 8.0   Glucose, UA NEGATIVE NEGATIVE mg/dL   Hgb urine dipstick NEGATIVE NEGATIVE   Bilirubin Urine NEGATIVE NEGATIVE   Ketones, ur NEGATIVE NEGATIVE mg/dL   Protein, ur NEGATIVE NEGATIVE mg/dL   Nitrite NEGATIVE NEGATIVE   Leukocytes, UA NEGATIVE NEGATIVE    Comment: Performed at [facility] [address] [place], [place] [address]  Vitamin B12     Status: None   Collection Time: 03/08/18  6:29 PM  Result Value Ref Range   Vitamin B-12 529 180 - 914 pg/mL    Comment: (NOTE) This assay is not validated for testing neonatal or myeloproliferative syndrome specimens for Vitamin B12 levels. Performed at [facility], [place] [address], [place], [place] [address]   Folate     Status: None   Collection Time: 03/08/18  6:29 PM  Result Value Ref Range   Folate 12.2 >5.9 ng/mL    Comment: Performed at [facility], [place] [address], [place], [place] [address]  Iron and TIBC     Status: None   Collection Time: 03/08/18  6:29 PM  Result Value Ref Range   Iron 42 28 - 170 ug/dL   TIBC 288 250 - 450 ug/dL   Saturation Ratios 15 10.4 - 31.8 %   UIBC 246 ug/dL    Comment: Performed at [facility], [place] [address] [place], [place] [address]  Ferritin     Status: Abnormal   Collection Time: 03/08/18  6:29 PM  Result Value Ref Range   Ferritin 353 (H) 11 - 307 ng/mL    Comment:  Performed at [facility], [place] [address] [place], [place] [address]  Reticulocytes     Status: Abnormal   Collection Time: 03/08/18  6:29 PM  Result Value Ref Range   Retic Ct Pct 2.2 0.4 - 3.1 %   RBC. 3.77 (L) 3.87 - 5.11 MIL/uL   Retic Count, Absolute 82.9 19.0 - 186.0 K/uL    Comment: Performed at [facility] [address] [place], [place] [address]  TSH     Status: None   Collection Time: 03/08/18  6:29 PM  Result Value Ref Range   TSH 0.785 0.350 - 4.500 uIU/mL    Comment: Performed by a 3rd Generation assay with a functional sensitivity of <=0.01 uIU/mL. Performed at [facility], [place] [address] [place], [place] [address]   Magnesium     Status: None   Collection Time: 03/08/18  6:29 PM  Result Value Ref Range   Magnesium 2.0 1.7 - 2.4 mg/dL    Comment: Performed at [facility], [place] [address], [place], [place] [address]  Phosphorus     Status: None   Collection Time: 03/08/18  6:29 PM  Result Value Ref Range   Phosphorus 4.2 2.5 - 4.6 mg/dL    Comment: Performed at [facility], [place] [address], [place], [place] [address]  CBC     Status: Abnormal   Collection Time: 03/09/18  5:27 AM  Result Value Ref Range   WBC 7.3 4.0 - 10.5 K/uL    Comment: WHITE COUNT CONFIRMED ON SMEAR   RBC 3.51 (L) 3.87 - 5.11 MIL/uL   Hemoglobin 9.7 (L) 12.0 - 15.0 g/dL   HCT 29.9 (L) 36.0 - 46.0 %   MCV 85.2 78.0 - 100.0 fL   MCH 27.6 26.0 - 34.0 pg   MCHC 32.4 30.0 - 36.0 g/dL   RDW 15.8 (H) 11.5 - 15.5 %   Platelets 338 150 - 400 K/uL    Comment: Performed at [facility], [place] [address], [place], [place] [address]  Comprehensive metabolic panel     Status: Abnormal   Collection Time: 03/09/18  5:27 AM  Result Value Ref Range   Sodium 140 135 - 145 mmol/L   Potassium 3.6 3.5 - 5.1 mmol/L   Chloride 109 101 - 111 mmol/L   CO2 22 22 - 32 mmol/L   Glucose, Bld 125 (H) 65 - 99 mg/dL   BUN 10 6 - 20 mg/dL   Creatinine, Ser 0.91 0.44 - 1.00 mg/dL    Calcium 8.5 (L) 8.9 - 10.3 mg/dL   Total Protein 5.8 (L) 6.5 - 8.1 g/dL   Albumin 2.9 (L) 3.5 - 5.0 g/dL   AST 16 15 - 41 U/L   ALT 16 14 - 54 U/L   Alkaline Phosphatase 82 38 - 126 U/L   Total Bilirubin 0.5 0.3 - 1.2 mg/dL   GFR calc non Af Amer >60 >60 mL/min   GFR calc Af Amer >60 >60 mL/min    Comment: (NOTE) The eGFR has been calculated using the CKD EPI equation. This calculation has not been validated in all clinical situations. eGFR's persistently <60 mL/min signify possible Chronic Kidney Disease.    Anion gap 9 5 - 15    Comment: Performed at [facility] [address], [place], [place] [address]    Ct Abdomen Pelvis W Contrast  Result Date: 03/08/2018 CLINICAL DATA:  Fever of unknown origin for the past 4 months. Occasional  abdominal tenderness. EXAM: CT ABDOMEN AND PELVIS WITH CONTRAST TECHNIQUE: Multidetector CT imaging of the abdomen and pelvis was performed using the standard protocol following bolus administration of intravenous contrast. CONTRAST:  183m ISOVUE-300 IOPAMIDOL (ISOVUE-300) INJECTION 61% COMPARISON:  None. FINDINGS: Lower chest: Minimal patchy centrilobular nodularity in the left lower lobe. Scarring/atelectasis in the lingula. Hepatobiliary: No focal liver abnormality is seen. Small amount of focal fat along the falciform ligament. Status post cholecystectomy. No biliary dilatation. Pancreas: Unremarkable. No pancreatic ductal dilatation or surrounding inflammatory changes. Spleen: Normal in size without focal abnormality. Adrenals/Urinary Tract: Adrenal glands are unremarkable. Kidneys are normal, without renal calculi, focal lesion, or hydronephrosis. Bladder is under distended. Stomach/Bowel: There is an inflammatory process/phlegmon in the right lower quadrant, centered within the mesentery just medial to the cecum. The phlegmon contains a few small foci air. There is wall thickening of the cecum and terminal ileum, as well as the nearby sigmoid  colon. A normal appendix is not identified. Mild sigmoid diverticulosis. The stomach and small bowel are unremarkable. No bowel obstruction. Vascular/Lymphatic: Aortic atherosclerosis. No enlarged abdominal or pelvic lymph nodes. Reproductive: Small calcified uterine fibroid. The ovaries are unremarkable. Other: No abdominal wall hernia or abnormality. No abdominopelvic ascites. No pneumoperitoneum. Musculoskeletal: No acute or significant osseous findings. IMPRESSION: 1. Inflammatory process in the right lower quadrant, centered within the mesentery just medial to the cecum, with wall thickening of the cecum and terminal ileum. Given that no normal appendix is identified, findings are concerning for chronic, ruptured appendicitis with phlegmonous change. The differential diagnosis also includes inflammatory bowel disease, although this is thought to be less likely. No discrete drainable fluid collection. 2. Minimal patchy centrilobular nodularity in the left lower lobe is likely infectious or inflammatory. 3.  Aortic atherosclerosis (ICD10-I70.0). These results will be called to the ordering clinician or representative by the Radiologist Assistant, and communication documented in the PACS or zVision Dashboard. Electronically Signed   By: WTitus DubinM.D.   On: 03/08/2018 08:56    ROSnegative except above Blood pressure 106/68, pulse (!) 57, temperature 98.7 F (37.1 C), temperature source Oral, resp. rate 17, SpO2 100 %. Physical Examvital signs stable afebrile no acute distress exam pertinent for her abdomen being soft nontender good bowel sounds  Labs okay CT reviewed  Assessment/Plan: Crohn's versus ruptured appendix Plan: I think the patient is fine for an outpatient colonoscopy and I told her I do this on Wednesday in all office and if she gets with my nurse BPamala Hurryon Monday she will go overall the instructions and I would continue antibiotics in the meantime with further workup and plans  pending those findings and I discussed the case with her nurse and please call me if any further question or problem this weekend or if that plan does not work  [facility] 03/09/2018, 1:54 PM

## 2018-03-09 NOTE — Progress Notes (Signed)
[facility] Progress Note     Subjective: CC:  Mild RLQ pain. Denies nausea or vomiting.last BM was 2 days ago after CT PO contrast.   Objective: Vital signs in last 24 hours: Temp:  [97.7 F (36.5 C)-98.7 F (37.1 C)] 98.7 F (37.1 C) (04/19 0613) Pulse Rate:  [56-77] 61 (04/19 0613) Resp:  [16-20] 17 (04/19 8366) BP: (95-145)/(55-73) 95/55 (04/19 0613) SpO2:  [97 %-100 %] 97 % (04/19 0613) Last BM Date: 03/07/18  Intake/Output from previous day: 04/18 0701 - 04/19 0700 In: 2580 [I.V.:980; IV Piggyback:1600] Out: -  Intake/Output this shift: No intake/output data recorded.  PE: Gen:  Alert, NAD, pleasant Card:  Regular rate and rhythm, pedal pulses 2+ BL Pulm:  Normal effort, clear to auscultation bilaterally Abd: Soft, TTP RLQ without peritonitis, +BS Skin: warm and dry, no rashes  Psych: A&Ox3   Lab Results:  Recent Labs    03/08/18 1421 03/09/18 0527  WBC 8.9 7.3  HGB 10.6* 9.7*  HCT 33.1* 29.9*  PLT 400 338   BMET Recent Labs    03/08/18 1421 03/09/18 0527  NA 138 140  K 3.3* 3.6  CL 100* 109  CO2 25 22  GLUCOSE 101* 125*  BUN 15 10  CREATININE 1.05* 0.91  CALCIUM 9.4 8.5*   PT/INR No results for input(s): LABPROT, INR in the last 72 hours. CMP     Component Value Date/Time   NA 140 03/09/2018 0527   K 3.6 03/09/2018 0527   CL 109 03/09/2018 0527   CO2 22 03/09/2018 0527   GLUCOSE 125 (H) 03/09/2018 0527   BUN 10 03/09/2018 0527   CREATININE 0.91 03/09/2018 0527   CALCIUM 8.5 (L) 03/09/2018 0527   PROT 5.8 (L) 03/09/2018 0527   ALBUMIN 2.9 (L) 03/09/2018 0527   AST 16 03/09/2018 0527   ALT 16 03/09/2018 0527   ALKPHOS 82 03/09/2018 0527   BILITOT 0.5 03/09/2018 0527   GFRNONAA >60 03/09/2018 0527   GFRAA >60 03/09/2018 0527   Lipase  No results found for: LIPASE     Studies/Results: Ct Abdomen Pelvis W Contrast  Result Date: 03/08/2018 CLINICAL DATA:  Fever of unknown origin for the past 4 months. Occasional  abdominal tenderness. EXAM: CT ABDOMEN AND PELVIS WITH CONTRAST TECHNIQUE: Multidetector CT imaging of the abdomen and pelvis was performed using the standard protocol following bolus administration of intravenous contrast. CONTRAST:  122m ISOVUE-300 IOPAMIDOL (ISOVUE-300) INJECTION 61% COMPARISON:  None. FINDINGS: Lower chest: Minimal patchy centrilobular nodularity in the left lower lobe. Scarring/atelectasis in the lingula. Hepatobiliary: No focal liver abnormality is seen. Small amount of focal fat along the falciform ligament. Status post cholecystectomy. No biliary dilatation. Pancreas: Unremarkable. No pancreatic ductal dilatation or surrounding inflammatory changes. Spleen: Normal in size without focal abnormality. Adrenals/Urinary Tract: Adrenal glands are unremarkable. Kidneys are normal, without renal calculi, focal lesion, or hydronephrosis. Bladder is under distended. Stomach/Bowel: There is an inflammatory process/phlegmon in the right lower quadrant, centered within the mesentery just medial to the cecum. The phlegmon contains a few small foci air. There is wall thickening of the cecum and terminal ileum, as well as the nearby sigmoid colon. A normal appendix is not identified. Mild sigmoid diverticulosis. The stomach and small bowel are unremarkable. No bowel obstruction. Vascular/Lymphatic: Aortic atherosclerosis. No enlarged abdominal or pelvic lymph nodes. Reproductive: Small calcified uterine fibroid. The ovaries are unremarkable. Other: No abdominal wall hernia or abnormality. No abdominopelvic ascites. No pneumoperitoneum. Musculoskeletal: No acute or significant osseous findings.  IMPRESSION: 1. Inflammatory process in the right lower quadrant, centered within the mesentery just medial to the cecum, with wall thickening of the cecum and terminal ileum. Given that no normal appendix is identified, findings are concerning for chronic, ruptured appendicitis with phlegmonous change. The  differential diagnosis also includes inflammatory bowel disease, although this is thought to be less likely. No discrete drainable fluid collection. 2. Minimal patchy centrilobular nodularity in the left lower lobe is likely infectious or inflammatory. 3.  Aortic atherosclerosis (ICD10-I70.0). These results will be called to the ordering clinician or representative by the Radiologist Assistant, and communication documented in the PACS or zVision Dashboard. Electronically Signed   By: Titus Dubin M.D.   On: 03/08/2018 08:56    Anti-infectives: Anti-infectives (From admission, onward)   Start     Dose/Rate Route Frequency Ordered Stop   03/09/18 1700  ciprofloxacin (CIPRO) IVPB 400 mg  Status:  Discontinued     400 mg 200 mL/hr over 60 Minutes Intravenous Every 12 hours 03/08/18 1818 03/08/18 1828   03/09/18 0500  ciprofloxacin (CIPRO) IVPB 400 mg     400 mg 200 mL/hr over 60 Minutes Intravenous Every 12 hours 03/08/18 1828     03/08/18 1645  ciprofloxacin (CIPRO) IVPB 400 mg     400 mg 200 mL/hr over 60 Minutes Intravenous  Once 03/08/18 1638 03/08/18 1942   03/08/18 1645  metroNIDAZOLE (FLAGYL) IVPB 500 mg     500 mg 100 mL/hr over 60 Minutes Intravenous  Once 03/08/18 1638 03/08/18 1818   03/08/18 0000  metroNIDAZOLE (FLAGYL) IVPB 500 mg     500 mg 100 mL/hr over 60 Minutes Intravenous Every 8 hours 03/08/18 1818         Assessment/Plan Intermittent fevers RLQ phlegmon - GI to evaluate, appreciate their recs - possible history of undiagnosed perforated appendicitis  - continue IV abx - NPO for possible procedure, ok for clear liquids if not having colonoscopy or other procedure recommended by GI. - mobilize/IS   We will follow.     LOS: 1 day    [facility] 03/09/2018, 10:15 AM Pager: [phone] Consults: [phone] Mon-Fri 7:00 am-4:30 pm Sat-Sun 7:00 am-11:30 am

## 2018-03-14 DIAGNOSIS — K635 Polyp of colon: Secondary | ICD-10-CM | POA: Diagnosis not present

## 2018-03-14 DIAGNOSIS — K5289 Other specified noninfective gastroenteritis and colitis: Secondary | ICD-10-CM | POA: Diagnosis not present

## 2018-03-14 DIAGNOSIS — Z86010 Personal history of colonic polyps: Secondary | ICD-10-CM | POA: Diagnosis not present

## 2018-03-14 DIAGNOSIS — K529 Noninfective gastroenteritis and colitis, unspecified: Secondary | ICD-10-CM | POA: Diagnosis not present

## 2018-03-16 ENCOUNTER — Telehealth: Payer: Self-pay | Admitting: Hematology and Oncology

## 2018-03-16 NOTE — Telephone Encounter (Signed)
Patient left message to cancel upcoming appointment. Patient stated she will no longer need to see provider.

## 2018-03-20 ENCOUNTER — Encounter: Payer: Self-pay | Admitting: Family

## 2018-03-22 ENCOUNTER — Encounter: Payer: 59 | Admitting: Hematology and Oncology

## 2018-03-22 ENCOUNTER — Encounter: Payer: Self-pay | Admitting: Family

## 2018-03-22 DIAGNOSIS — R9389 Abnormal findings on diagnostic imaging of other specified body structures: Secondary | ICD-10-CM | POA: Diagnosis not present

## 2018-03-23 DIAGNOSIS — R1030 Lower abdominal pain, unspecified: Secondary | ICD-10-CM | POA: Diagnosis not present

## 2018-03-23 DIAGNOSIS — R110 Nausea: Secondary | ICD-10-CM | POA: Diagnosis not present

## 2018-04-05 ENCOUNTER — Other Ambulatory Visit (HOSPITAL_COMMUNITY): Payer: Self-pay | Admitting: Gastroenterology

## 2018-04-05 ENCOUNTER — Encounter (HOSPITAL_COMMUNITY): Payer: Self-pay

## 2018-04-05 ENCOUNTER — Ambulatory Visit (HOSPITAL_COMMUNITY)
Admission: RE | Admit: 2018-04-05 | Discharge: 2018-04-05 | Disposition: A | Discharge status: Home / Self Care | Payer: 59 | Source: Ambulatory Visit | Attending: Gastroenterology | Admitting: Gastroenterology

## 2018-04-05 DIAGNOSIS — R1084 Generalized abdominal pain: Secondary | ICD-10-CM

## 2018-04-05 DIAGNOSIS — R933 Abnormal findings on diagnostic imaging of other parts of digestive tract: Secondary | ICD-10-CM | POA: Insufficient documentation

## 2018-04-05 DIAGNOSIS — G43A0 Cyclical vomiting, not intractable: Secondary | ICD-10-CM | POA: Diagnosis not present

## 2018-04-05 DIAGNOSIS — R1115 Cyclical vomiting syndrome unrelated to migraine: Secondary | ICD-10-CM

## 2018-04-05 DIAGNOSIS — K5080 Crohn's disease of both small and large intestine without complications: Secondary | ICD-10-CM | POA: Diagnosis not present

## 2018-04-05 DIAGNOSIS — R1110 Vomiting, unspecified: Secondary | ICD-10-CM | POA: Diagnosis not present

## 2018-04-05 DIAGNOSIS — K56609 Unspecified intestinal obstruction, unspecified as to partial versus complete obstruction: Secondary | ICD-10-CM | POA: Diagnosis not present

## 2018-04-05 MED ORDER — IOPAMIDOL (ISOVUE-300) INJECTION 61%
30.0000 mL | Freq: Once | INTRAVENOUS | Status: AC | PRN
  Administered 2018-04-05: 30 mL via ORAL

## 2018-04-05 MED ORDER — IOPAMIDOL (ISOVUE-300) INJECTION 61%
100.0000 mL | Freq: Once | INTRAVENOUS | Status: AC | PRN
  Administered 2018-04-05: 100 mL via INTRAVENOUS

## 2018-04-10 DIAGNOSIS — K5090 Crohn's disease, unspecified, without complications: Secondary | ICD-10-CM | POA: Diagnosis not present

## 2018-04-12 ENCOUNTER — Telehealth: Payer: Self-pay | Admitting: Gastroenterology

## 2018-04-12 NOTE — Telephone Encounter (Signed)
Pt states she is requesting  to transfer from Dr.Magod's care states there is a lack on communication with him. Records will be placed on Dr.Jacobs' desk for review.

## 2018-04-25 ENCOUNTER — Encounter: Payer: Self-pay | Admitting: Gastroenterology

## 2018-04-25 NOTE — Telephone Encounter (Signed)
Dr. Ardis Hughs reviewed records and schedule OV with Dr. Ardis Hughs or extender within 2 weeks.  Dr. Ardis Hughs said he will "backup the extender"  Per Patty okay to schedule with any extender on non supervising day.  Called and LM on vmail to call back

## 2018-05-02 ENCOUNTER — Other Ambulatory Visit (INDEPENDENT_AMBULATORY_CARE_PROVIDER_SITE_OTHER): Payer: 59

## 2018-05-02 ENCOUNTER — Ambulatory Visit (INDEPENDENT_AMBULATORY_CARE_PROVIDER_SITE_OTHER): Payer: 59 | Admitting: Gastroenterology

## 2018-05-02 ENCOUNTER — Encounter: Payer: Self-pay | Admitting: Gastroenterology

## 2018-05-02 VITALS — BP 82/48 | HR 79 | Ht 64.5 in | Wt 146.0 lb

## 2018-05-02 DIAGNOSIS — K50819 Crohn's disease of both small and large intestine with unspecified complications: Secondary | ICD-10-CM

## 2018-05-02 DIAGNOSIS — K5080 Crohn's disease of both small and large intestine without complications: Secondary | ICD-10-CM | POA: Insufficient documentation

## 2018-05-02 LAB — C-REACTIVE PROTEIN: CRP: 1 mg/dL (ref 0.5–20.0)

## 2018-05-02 MED ORDER — PREDNISONE 10 MG PO TABS: 40.0000 mg | ORAL_TABLET | Freq: Every day | ORAL | 0 refills | Status: DC

## 2018-05-02 NOTE — Patient Instructions (Signed)
Your provider has requested that you go to the basement level for lab work before leaving today. Press "B" on the elevator. The lab is located at the first door on the left as you exit the elevator.  We have sent the following medications to your pharmacy for you to pick up at your convenience: prednisone 10 mg to take 4 tablets (40 mg) by mouth daily x 2 weeks. We will instruct you further after the two weeks to taper down on the medication.

## 2018-05-02 NOTE — Progress Notes (Signed)
05/02/2018 Angela [name] [id number] 11-05-1956   HISTORY OF PRESENT ILLNESS: This is a 62 year old female who is new to our office.  She was actually referred here by Dr. Nadeen Landau from [facility] surgery for a second opinion regarding a recent diagnosis of Crohn's disease.  She tells me that all this began in January 2019.  She tells me that she was treated for PNA, but despite treatment she continued with fevers.  She ended up seeing an infectious disease physician who evaluated her for fever of unknown origin.  They performed a CT scan of the abdomen pelvis with contrast in April.  That showed the following:  IMPRESSION: 1. Inflammatory process in the right lower quadrant, centered within the mesentery just medial to the cecum, with wall thickening of the cecum and terminal ileum. Given that no normal appendix is identified, findings are concerning for chronic, ruptured appendicitis with phlegmonous change. The differential diagnosis also includes inflammatory bowel disease, although this is thought to be less likely. No discrete drainable fluid collection. 2. Minimal patchy centrilobular nodularity in the left lower lobe is likely infectious or inflammatory. 3.  Aortic atherosclerosis (ICD10-I70.0).   She was previously a patient of [facility] so Dr. Watt Climes scheduled her for a colonoscopy as surgical consult did not believe that this was a ruptured appendicitis.  She had a colonoscopy performed on April 24 at which time she was found to have an area of congested, red, pseudopolypoid and scar area in the ascending colon, in the proximal ascending colon, as well as of the IC valve.  She had some congested mucosa in the distal sigmoid colon.  Also found to have diverticulosis and one diminutive polyp from the descending colon.  Diagnosis was suspicious for Crohn's disease.  Pathology of the ascending colon and IC valve showed colonic mucosa with erosion, focal active  cryptitis, and mild crypt distortion negative for dysplasia or granuloma.  Sigmoid colon biopsies showed colonic mucosa with ulceration and focal active cryptitis negative for crypt distortion, dysplasia, granuloma.  The polyp was a hyperplastic polyp.  She had the IBD panel performed and that returned back positive for Crohn's disease.  She was placed on apriso 1500 mg daily and levbid every 12 hours, which she has been on for about the last 6 weeks or so.  The patient tells me that other than occasional abdominal pain she really had no GI symptoms prior to the colonoscopy.  Since the colonoscopy, however, she has been having severe lower abdominal, feeling constantly bloated, having diarrhea.  When she saw Dr. Watt Climes for follow-up 2 weeks after her colonoscopy and she reported the symptoms he ordered another CT scan, which was essentially unchanged, but showed the following:  IMPRESSION: Right lower quadrant inflammatory process shows no significant change. Differential diagnosis includes ruptured appendicitis and Crohn's disease.  Increased small amount of pelvic free fluid. No evidence of abscess.  Partial or low-grade small bowel obstruction, with transition point in the distal ileum.  He then referred her to Dr. Dema Severin, [facility] who saw her at the end of May and told her that she did not need surgery, she just needed to control her Crohn's disease.  She said that she no longer wanted to see Dr. Watt Climes so he referred her here.  She tells me that over the past few days she has actually been feeling okay, but she has had 3 "flares" of her symptoms in the past 6 weeks.  She says that when her  symptoms become very severe she has terrible lower abdominal pain and diarrhea.  She says that she did already have 3-4 bowel movements today, but her abdominal pain has been pretty mild.   Past Medical History:  Diagnosis Date  . Crohn's colitis ([place])   . Family history of breast cancer   . Hyperlipidemia   .  Hypertension    Past Surgical History:  Procedure Laterality Date  . CESAREAN SECTION    . CHOLECYSTECTOMY  2011  . KNEE SURGERY Right   . SPINE SURGERY     Herniated Disc  . tummy tuck  2001    reports that she has quit smoking. She quit after 20.00 years of use. She has never used smokeless tobacco. She reports that she drinks alcohol. She reports that she does not use drugs. family history includes Breast cancer in her maternal grandmother and mother; Breast cancer (age of onset: 27) in her maternal aunt; Cervical cancer in her sister; Esophageal cancer in her father; Heart attack in her paternal grandfather; Leukemia (age of onset: 79) in her sister. Allergies  Allergen Reactions  . Penicillins Hives    Has patient had a PCN reaction causing immediate rash, facial/tongue/throat swelling, SOB or lightheadedness with hypotension: Yes Has patient had a PCN reaction causing severe rash involving mucus membranes or skin necrosis: No Has patient had a PCN reaction that required hospitalization: No Has patient had a PCN reaction occurring within the last 10 years: No If all of the above answers are "NO", then may proceed with Cephalosporin use.       Outpatient Encounter Medications as of 05/02/2018  Medication Sig  . atorvastatin (LIPITOR) 40 MG tablet Take 40 mg by mouth daily.  . hyoscyamine (LEVBID) 0.375 MG 12 hr tablet Take 0.375 mg by mouth every 12 (twelve) hours.  Marland Kitchen lisinopril-hydrochlorothiazide (PRINZIDE,ZESTORETIC) 20-25 MG tablet Take 1 tablet by mouth daily.  . mesalamine (APRISO) 0.375 g 24 hr capsule Take 1,500 mg by mouth daily.  . predniSONE (DELTASONE) 10 MG tablet Take 4 tablets (40 mg total) by mouth daily with breakfast.  . [DISCONTINUED] potassium chloride (K-DUR) 10 MEQ tablet Take 1 tablet (10 mEq total) by mouth daily.   No facility-administered encounter medications on file as of 05/02/2018.      REVIEW OF SYSTEMS  : All other systems reviewed and negative  except where noted in the History of Present Illness.   PHYSICAL EXAM: BP (!) 82/48   Pulse 79   Ht 5' 4.5" (1.638 m)   Wt 146 lb (66.2 kg)   BMI 24.67 kg/m  General: Well developed white female in no acute distress Head: Normocephalic and atraumatic Eyes:  Sclerae anicteric, conjunctiva pink. Ears: Normal auditory acuity Lungs: Clear throughout to auscultation; no W/R/R. Heart: Regular rate and rhythm; no M/R/G. Abdomen: Soft, non-distended.  BS present.  Mild RLQ TTP. Musculoskeletal: Symmetrical with no gross deformities  Skin: No lesions on visible extremities Extremities: No edema  Neurological: Alert oriented x 4, grossly non-focal Psychological:  Alert and cooperative. Normal mood and affect  ASSESSMENT AND PLAN: *62 year old female with what sounds like a new diagnosis of Crohn's disease, likely considered colonic and small bowel.  On apriso with no improvement of symptoms.  Will review with Dr. Ardis Hughs.  She is likely going to need biologic therapy.  Will check quant gold and Hep B studies in preparation for this.  I am going to put her on prednisone 40 mg daily for now, likely  will continue at 40 mg for 2 weeks and then can discuss taper from there.  Once I discuss with Dr. Ardis Hughs and if he agrees then will get insurance going with biologic approval.   CC:  Iona Hansen, PA-C CC:  Dr. Dema Severin with [facility]

## 2018-05-03 ENCOUNTER — Telehealth: Payer: Self-pay

## 2018-05-03 DIAGNOSIS — K50819 Crohn's disease of both small and large intestine with unspecified complications: Secondary | ICD-10-CM

## 2018-05-03 MED ORDER — ADALIMUMAB 40 MG/0.8ML ~~LOC~~ AJKT: 160.0000 mg | AUTO-INJECTOR | SUBCUTANEOUS | 6 refills | Status: DC

## 2018-05-03 NOTE — Telephone Encounter (Signed)
The referral was sent to ID and the lab order is in [facility].  Humira sent to [facility] for approval process. The pt has been advised and will call in  1 week if no call from ID with appt. The pt will come in today or tomorrow for labs.  Office note faxed to [facility]

## 2018-05-03 NOTE — Telephone Encounter (Signed)
Zehr, Laban Emperor, PA-C  Timothy Lasso, RN        Glada Wickstrom, will you please let her know that Dr. Ardis Hughs agrees with starting biologic and agrees with the prednisone as well. Please make her an appt with ID ASAP to discuss if she needs any other vaccinations prior to starting. Also, please apologize because he would like one more lab test drawn so she will need to come back for that. Please order TPMT level. And can you please start with insurance approval with Humira, as that is what Dr. Ardis Hughs would like to try for?   Thank you,   Jess   Previous Messages    ----- Message -----  From: Milus Banister, MD  Sent: 05/03/2018  9:07 AM  To: Loralie Champagne, PA-C     ----- Message -----  From: Loralie Champagne, PA-C  Sent: 05/02/2018  5:16 PM  To: Milus Banister, MD       Routed Note   Author: Milus Banister, MD Service: Gastroenterology Author Type: Physician  Filed: 05/03/2018 9:07 AM Encounter Date: 05/02/2018 Status: Signed  Editor: Milus Banister, MD (Physician)       Show:Clear all [x] Manual[] Template[] Copied  Added by: [x] Milus Banister, MD   [] Hover for details   Jess,  Thanks.  I agree that she needs biologic here.  I recommend humira at usual start dose regimen (160 day 1, then 80 two weeks later, then 4 two weeks later and every two weeks after that).  Can you please get her in with ID to help with getting her up to date on vaccinations since she'll be starting anti-TNF.   Please also check TPMT enzyme activity level in case immunomodulators are needed. She needs ROV (within 3-4 weeks, either of [place]) to discuss biologics more directly prior to start.  Agree with predisone 31m for now.  Thanks

## 2018-05-03 NOTE — Telephone Encounter (Signed)
-----   Message from Loralie Champagne, PA-C sent at 05/03/2018  9:36 AM EDT ----- Chong Sicilian, will you please let her know that Dr. Ardis Hughs agrees with starting biologic and agrees with the prednisone as well.  Please make her an appt with ID ASAP to discuss if she needs any other vaccinations prior to starting.  Also, please apologize because he would like one more lab test drawn so she will need to come back for that.  Please order TPMT level.  And can you please start with insurance approval with Humira, as that is what Dr. Ardis Hughs would like to try for?  Thank you,  Jess  ----- Message ----- From: Milus Banister, MD Sent: 05/03/2018   9:07 AM To: Loralie Champagne, PA-C    ----- Message ----- From: Loralie Champagne, PA-C Sent: 05/02/2018   5:16 PM To: Milus Banister, MD

## 2018-05-03 NOTE — Progress Notes (Signed)
Jess,  Thanks.  I agree that she needs biologic here.  I recommend humira at usual start dose regimen (160 day 1, then 80 two weeks later, then 4 two weeks later and every two weeks after that).  Can you please get her in with ID to help with getting her up to date on vaccinations since she'll be starting anti-TNF.   Please also check TPMT enzyme activity level in case immunomodulators are needed. She needs ROV (within 3-4 weeks, either of [place]) to discuss biologics more directly prior to start.  Agree with predisone 83m for now.  Thanks

## 2018-05-04 ENCOUNTER — Telehealth: Payer: Self-pay | Admitting: Gastroenterology

## 2018-05-04 ENCOUNTER — Other Ambulatory Visit: Payer: 59

## 2018-05-04 DIAGNOSIS — K50819 Crohn's disease of both small and large intestine with unspecified complications: Secondary | ICD-10-CM | POA: Diagnosis not present

## 2018-05-04 LAB — HEPATITIS B SURFACE ANTIGEN: Hepatitis B Surface Ag: NONREACTIVE

## 2018-05-04 LAB — HEPATITIS B SURFACE ANTIBODY,QUALITATIVE: Hep B S Ab: NONREACTIVE

## 2018-05-04 LAB — QUANTIFERON-TB GOLD PLUS
NIL: 0.03 IU/mL
QuantiFERON-TB Gold Plus: NEGATIVE
TB1-NIL: 0 [IU]/mL
TB2-NIL: 0 IU/mL

## 2018-05-04 NOTE — Telephone Encounter (Signed)
No, that is not soon enough. Can you see if her PCP can get her in sooner?

## 2018-05-04 NOTE — Telephone Encounter (Signed)
Dr Ardis Hughs is 7/8/ ok for ID appt?

## 2018-05-04 NOTE — Telephone Encounter (Signed)
I'll call Dr Marin Comment and try to get the pt an appt sooner to discuss immunizations

## 2018-05-04 NOTE — Telephone Encounter (Signed)
Dr Ardis Hughs I called PCP and they have not encountered this before. Dr Marin Comment is off today and they will speak with her on Monday and call back with a response.

## 2018-05-06 ENCOUNTER — Encounter: Payer: Self-pay | Admitting: Gastroenterology

## 2018-05-07 ENCOUNTER — Telehealth: Payer: Self-pay

## 2018-05-07 ENCOUNTER — Encounter: Payer: Self-pay | Admitting: Gastroenterology

## 2018-05-07 NOTE — Telephone Encounter (Signed)
Patient will come in and see [facility] tomorrow.  Janett Billow did not have any available appts.  Anderson Malta, please see note from Dr. Lyndel Safe

## 2018-05-07 NOTE — Telephone Encounter (Signed)
Hi Angela [name] I have talked to the patient in detail.  She had fever ever since she has stopped Apriso.  I have instructed her to restart Apriso.  If we can get the ID to see her sooner than July 8, they will be great.  Thank you for working her in for tomorrow.  I am concerned about any intra-abdominal abscess.  I have also reviewed the CT scans.  If she is having any problems tomorrow -may need a repeat CT scan of the abdomen and pelvis with p.o. and IV contrast to rule out any intra-abdominal abscesses, continue Apriso and prednisone for now.  She will need labs tomorrow as well - CBC, CMP, CRP.  She was not keen on going on empiric antibiotics.  I do agree.

## 2018-05-07 NOTE — Telephone Encounter (Signed)
Patient with a history with Dr. Ardis Hughs and Alonza Bogus, PA please see recent office notes. Is to start on biologic tx Humira once she has seen ID. ID appt is not until July I left a message with ID this am but was not able to speak with any staff.  She reports fever all weekend     T max 104. She is using Tylenol PRN for fever. Fever this am 101.   She is on Apriso 4 daily and prednisone 40 mg for her Crohn's.  She denies blood in stool, diarrhea, cramping, nausea, vomiting, HA, burning with urination, sinus symptoms or any symptoms that could account for a fever.  She feels fine with the exception of the fever.   Dr. Lyndel Safe, [place] are DOD.  Please review and advise.

## 2018-05-08 ENCOUNTER — Ambulatory Visit (INDEPENDENT_AMBULATORY_CARE_PROVIDER_SITE_OTHER): Payer: 59 | Admitting: Physician Assistant

## 2018-05-08 ENCOUNTER — Other Ambulatory Visit (INDEPENDENT_AMBULATORY_CARE_PROVIDER_SITE_OTHER): Payer: 59

## 2018-05-08 ENCOUNTER — Encounter: Payer: Self-pay | Admitting: Physician Assistant

## 2018-05-08 VITALS — BP 80/50 | HR 76 | Ht 64.0 in | Wt 143.0 lb

## 2018-05-08 DIAGNOSIS — R509 Fever, unspecified: Secondary | ICD-10-CM | POA: Diagnosis not present

## 2018-05-08 DIAGNOSIS — K50119 Crohn's disease of large intestine with unspecified complications: Secondary | ICD-10-CM | POA: Diagnosis not present

## 2018-05-08 DIAGNOSIS — R1031 Right lower quadrant pain: Secondary | ICD-10-CM | POA: Diagnosis not present

## 2018-05-08 LAB — URINALYSIS, ROUTINE W REFLEX MICROSCOPIC
BILIRUBIN URINE: NEGATIVE
Hgb urine dipstick: NEGATIVE
Ketones, ur: NEGATIVE
Leukocytes, UA: NEGATIVE
Nitrite: NEGATIVE
PH: 5.5 (ref 5.0–8.0)
RBC / HPF: NONE SEEN (ref 0–?)
SPECIFIC GRAVITY, URINE: 1.015 (ref 1.000–1.030)
Total Protein, Urine: NEGATIVE
Urine Glucose: NEGATIVE
Urobilinogen, UA: 0.2 (ref 0.0–1.0)

## 2018-05-08 LAB — COMPREHENSIVE METABOLIC PANEL
ALBUMIN: 3.9 g/dL (ref 3.5–5.2)
ALT: 59 U/L — ABNORMAL HIGH (ref 0–35)
AST: 37 U/L (ref 0–37)
Alkaline Phosphatase: 103 U/L (ref 39–117)
BUN: 25 mg/dL — ABNORMAL HIGH (ref 6–23)
CALCIUM: 9.6 mg/dL (ref 8.4–10.5)
CO2: 26 meq/L (ref 19–32)
Chloride: 101 mEq/L (ref 96–112)
Creatinine, Ser: 0.99 mg/dL (ref 0.40–1.20)
GFR: 60.87 mL/min (ref >60.00)
Glucose, Bld: 194 mg/dL — ABNORMAL HIGH (ref 70–99)
Potassium: 4 mEq/L (ref 3.5–5.1)
Sodium: 137 mEq/L (ref 135–145)
Total Bilirubin: 0.4 mg/dL (ref 0.2–1.2)
Total Protein: 7.2 g/dL (ref 6.0–8.3)

## 2018-05-08 LAB — CBC WITH DIFFERENTIAL/PLATELET
Basophils Absolute: 0 10*3/uL (ref 0.0–0.1)
Basophils Relative: 0.4 % (ref 0.0–3.0)
Eosinophils Absolute: 0 10*3/uL (ref 0.0–0.7)
Eosinophils Relative: 0.2 % (ref 0.0–5.0)
HEMATOCRIT: 38.3 % (ref 36.0–46.0)
Hemoglobin: 12.9 g/dL (ref 12.0–15.0)
Lymphs Abs: 0.6 10*3/uL — ABNORMAL LOW (ref 0.7–4.0)
MCHC: 33.7 g/dL (ref 30.0–36.0)
MCV: 83.1 fl (ref 78.0–100.0)
Monocytes Absolute: 0.3 10*3/uL (ref 0.1–1.0)
Monocytes Relative: 3.3 % (ref 3.0–12.0)
NEUTROS ABS: 8.1 10*3/uL — AB (ref 1.4–7.7)
PLATELETS: 305 10*3/uL (ref 150.0–400.0)
RBC: 4.6 Mil/uL (ref 3.87–5.11)
RDW: 15.3 % (ref 11.5–15.5)
WBC: 9 10*3/uL (ref 4.0–10.5)

## 2018-05-08 NOTE — Patient Instructions (Signed)
If you are age 60 or older, your body mass index should be between 23-30. Your Body mass index is 24.55 kg/m. If this is out of the aforementioned range listed, please consider follow up with your Primary Care Provider.  If you are age 10 or younger, your body mass index should be between 19-25. Your Body mass index is 24.55 kg/m. If this is out of the aformentioned range listed, please consider follow up with your Primary Care Provider.   Your provider has requested that you go to the basement level for lab work before leaving today. Press "B" on the elevator. The lab is located at the first door on the left as you exit the elevator.  You have been scheduled for a CT scan of the abdomen and pelvis at [facility] ([address]---this is in the same building as [facility]).   You are scheduled on 05/10/18 at 10:15. You should arrive 15 minutes prior to your appointment time for registration. Please follow the written instructions below on the day of your exam:  WARNING: IF YOU ARE ALLERGIC TO IODINE/X-RAY DYE, PLEASE NOTIFY RADIOLOGY IMMEDIATELY AT [phone]! YOU WILL BE GIVEN A 13 HOUR PREMEDICATION PREP.  1) Do not eat or drink anything after 6:15 am (4 hours prior to your test) 2) You have been given 2 bottles of oral contrast to drink. The solution may taste better if refrigerated, but do NOT add ice or any other liquid to this solution. Shake well before drinking.    Drink 1 bottle of contrast @ 8:15 am (2 hours prior to your exam)  Drink 1 bottle of contrast @ 9:15 am (1 hour prior to your exam)  You may take any medications as prescribed with a small amount of water except for the following: Metformin, Glucophage, Glucovance, Avandamet, Riomet, Fortamet, Actoplus Met, Janumet, Glumetza or Metaglip. The above medications must be held the day of the exam AND 48 hours after the exam.  The purpose of you drinking the oral contrast is to aid in the visualization of  your intestinal tract. The contrast solution may cause some diarrhea. Before your exam is started, you will be given a small amount of fluid to drink. Depending on your individual set of symptoms, you may also receive an intravenous injection of x-ray contrast/dye. Plan on being at [facility] for 30 minutes or longer, depending on the type of exam you are having performed.  This test typically takes 30-45 minutes to complete.  If you have any questions regarding your exam or if you need to reschedule, you may call the CT department at [phone] between the hours of 8:00 am and 5:00 pm, Monday-Friday.  ________________________________________________________________________ Follow up with Dr. Ardis Hughs on June 05, 2018 at 2 pm.  Thank you for choosing me and [facility].   Angela Newer, PA-C

## 2018-05-08 NOTE — Telephone Encounter (Signed)
OK, I will manage her vaccinations for now.  She needs pneumovax (prevnar 13 now and then prevnar 23 in 8-9 weeks from now).  Cannot start biologics for 4-5 days from date of prevnar 13 vaccine.  She needs flu shot (annually, give now if she hasn't had in a year) cannot be nasal flu vaccine.

## 2018-05-08 NOTE — Progress Notes (Signed)
Chief Complaint: Abdominal pain and fever  HPI:    Angela [name] is a 62 year old female, who was assigned to Dr. Ardis Hughs and presents to clinic today for a complaint of abdominal pain and fever.    05/02/2018 seen by Alonza Bogus, PA-C.  Had been referred to our clinic from [facility] [place] for a second opinion regarding a recent diagnosis of Crohn's disease.  Apparently this all began in January.  Was seen by ID who evaluated her for fever of unknown origin, they performed a CT scan of the abdomen pelvis with contrast in April that showed inflammatory process in the right lower quadrant.  Was a previous patient at [facility], Dr. May God.  Colonoscopy April 24 found to have an area of congested, red, pseudopolypoid and scar area in the ascending colon, in the proximal ascending colon as well as the IC valve.  Also with diverticulosis and a diminutive polyp in the descending colon.  Diagnosis was suspicious for Crohn's disease.  Path showed erosion, focal active cryptitis and mild crypt distortion negative for dysplasia or granuloma.  IBD panel showed positive for Crohn's disease.  Placed on a Apriso 1500 mg daily and Levbid every 12 hours.  At that time she is only having occasional abdominal pain.  Was seen by Dr. Dema Severin at [place] at the end of May who told her that she did not need surgery she does need to control her Crohn's disease.  At times having 3-4 bowel movements a day but her abdominal pain has been mild.  It was discussed that she would need to start biologic therapy.  Humira was recommended.  Also recommended she see ID to help with getting her up-to-date on vaccinations.  TPMT enzyme activity level was ordered.    05/07/2018 called and reported fever all weekend with a T-max of 104.  Had been using Tylenol.  Was on Apriso 4 daily and Prednisone 40 mg.  Dr. Lyndel Safe reviewed note.  She was advised to restart her Apriso asd she did stop this with the fever.  Recommend to get a sooner ID consult.  There  was concern regarding intra-abdominal abscess.  Discussed she may need repeat CT scan of the abdomen and pelvis with p.o. and IV contrast to rule out any intra-abdominal abscess.      Today, describes that on Saturday, 05/05/2018 she awoke and felt like she had a "hangover", but she had not drank in weeks.  She just "did not feel right".  Ended up sleeping all day long and that afternoon checked her temperature which was 104 degrees.  She took a Tylenol and fell back asleep, her fever broke and the next morning it was again 103/104.  She continued her Tylenol use and yesterday this was 102.  Today the patient does not believe she has a fever.  She did have an increase in right lower quadrant abdominal pain yesterday and the day before, but today feels somewhat better.  Does describe that she had an increase from one bowel movement a day to about 4/day over the past couple of days, but otherwise has really been feeling good on her Prednisone.  She did restart her Apriso though when she started with a fever, also continues on Prednisone 17m qd.    Social history for a "big [place]" [place] this weekend which patient would like to enjoy.    Denies weight loss, anorexia, nausea, vomiting, blood in her stool, melena, heartburn or reflux.  Past  Medical History:  Diagnosis Date  . Crohn's colitis ([place])   . Family history of breast cancer   . Hyperlipidemia   . Hypertension     Past Surgical History:  Procedure Laterality Date  . CESAREAN SECTION    . CHOLECYSTECTOMY  2011  . KNEE SURGERY Right   . SPINE SURGERY     Herniated Disc  . tummy tuck  2001    Current Outpatient Medications  Medication Sig Dispense Refill  . Adalimumab (HUMIRA PEN-CD/UC/HS STARTER) 40 MG/0.8ML PNKT Inject 160 mg into the skin as directed. 160 mg day 1, 80 mg day 14, 40 mg day 28, then 40 mg every 2 wks 3 each 6  . atorvastatin (LIPITOR) 40 MG tablet Take 40 mg by mouth daily.    . hyoscyamine (LEVBID) 0.375 MG  12 hr tablet Take 0.375 mg by mouth every 12 (twelve) hours.  6  . lisinopril-hydrochlorothiazide (PRINZIDE,ZESTORETIC) 20-25 MG tablet Take 1 tablet by mouth daily.    . mesalamine (APRISO) 0.375 g 24 hr capsule Take 1,500 mg by mouth daily.    . predniSONE (DELTASONE) 10 MG tablet Take 4 tablets (40 mg total) by mouth daily with breakfast. 100 tablet 0   No current facility-administered medications for this visit.     Allergies as of 05/08/2018 - Review Complete 05/02/2018  Allergen Reaction Noted  . Penicillins Hives 05/29/2014    Family History  Problem Relation Age of Onset  . Breast cancer Mother   . Esophageal cancer Father   . Leukemia Sister 5  . Breast cancer Maternal Aunt 64  . Breast cancer Maternal Grandmother        dx in her late 75s  . Heart attack Paternal Grandfather   . Cervical cancer Sister   . Colon cancer Neg Hx     Social History   Socioeconomic History  . Marital status: Married    Spouse name: Not on file  . Number of children: 3  . Years of education: 61  . Highest education level: Not on file  Occupational History  . Occupation: Cabin crew  Social Needs  . Financial resource strain: Not on file  . Food insecurity:    Worry: Not on file    Inability: Not on file  . Transportation needs:    Medical: Not on file    Non-medical: Not on file  Tobacco Use  . Smoking status: Former Smoker    Years: 20.00  . Smokeless tobacco: Never Used  Substance and Sexual Activity  . Alcohol use: Yes    Comment: occasionally  . Drug use: No  . Sexual activity: Yes  Lifestyle  . Physical activity:    Days per week: Not on file    Minutes per session: Not on file  . Stress: Not on file  Relationships  . Social connections:    Talks on phone: Not on file    Gets together: Not on file    Attends religious service: Not on file    Active member of club or organization: Not on file    Attends meetings of clubs or organizations: Not on file    Relationship  status: Not on file  . Intimate partner violence:    Fear of current or ex partner: Not on file    Emotionally abused: Not on file    Physically abused: Not on file    Forced sexual activity: Not on file  Other Topics Concern  . Not on file  Social  History Narrative  . Not on file    Review of Systems:    Constitutional: No weight loss Cardiovascular: No chest pain Respiratory: No SOB  Gastrointestinal: See HPI and otherwise negative GU: no dysuria   Physical Exam:  Vital signs: BP (!) 80/50   Pulse 76   Ht 5' 4"  (1.626 m)   Wt 143 lb (64.9 kg)   BMI 24.55 kg/m    Constitutional:   Pleasant Caucasian female appears to be in NAD, Well developed, Well nourished, alert and cooperative  Respiratory: Respirations even and unlabored. Lungs clear to auscultation bilaterally.   No wheezes, crackles, or rhonchi.  Cardiovascular: Normal S1, S2. No MRG. Regular rate and rhythm. No peripheral edema, cyanosis or pallor.  Gastrointestinal:  Soft, nondistended, mild RLQ ttp, No rebound or guarding. Normal bowel sounds. No appreciable masses or hepatomegaly. Psychiatric:  Demonstrates good judgement and reason without abnormal affect or behaviors.  RELEVANT LABS AND IMAGING: CBC    Component Value Date/Time   WBC 7.3 03/09/2018 0527   RBC 3.51 (L) 03/09/2018 0527   HGB 9.7 (L) 03/09/2018 0527   HCT 29.9 (L) 03/09/2018 0527   PLT 338 03/09/2018 0527   MCV 85.2 03/09/2018 0527   MCH 27.6 03/09/2018 0527   MCHC 32.4 03/09/2018 0527   RDW 15.8 (H) 03/09/2018 0527   LYMPHSABS 1.9 03/08/2018 1421   MONOABS 0.6 03/08/2018 1421   EOSABS 0.4 03/08/2018 1421   BASOSABS 0.1 03/08/2018 1421    CMP     Component Value Date/Time   NA 140 03/09/2018 0527   K 3.6 03/09/2018 0527   CL 109 03/09/2018 0527   CO2 22 03/09/2018 0527   GLUCOSE 125 (H) 03/09/2018 0527   BUN 10 03/09/2018 0527   CREATININE 0.91 03/09/2018 0527   CALCIUM 8.5 (L) 03/09/2018 0527   PROT 5.8 (L) 03/09/2018 0527     ALBUMIN 2.9 (L) 03/09/2018 0527   AST 16 03/09/2018 0527   ALT 16 03/09/2018 0527   ALKPHOS 82 03/09/2018 0527   BILITOT 0.5 03/09/2018 0527   GFRNONAA >60 03/09/2018 0527   GFRAA >60 03/09/2018 0527    Assessment: 1.  Right lower quadrant abdominal pain: Worse over the past couple of days, some better now; relation to Crohn's versus abscess versus other 2.  Fever of unknown origin: History of the same at the beginning of work-up in January, see HPI ;consider possible UTI versus relation to Crohn's versus intra-abdominal loss abscess versus other 3.  Crohn's disease: With complication as above  Plan: 1.  Dr. Ardis Hughs saw the patient at time of visit as well 2.  Scheduled patient for patient for a CT with oral and IV contrast on 05/10/2018. 3.  Ordered labs to include CBC and CMP.  Also ordered UA with reflex culture. 4.  Patient to continue Prednisone 40 mg daily and Apriso 5.  Patient will need hep B vaccine and Pneumovax prior to starting Humira.  Humira will hopefully be started within the next week to 10 days pending results of CT above 6.  Scheduled patient with Dr. Ardis Hughs in 3-4 weeks per his recommendation today (double-booked).  Angela Newer, PA-C [facility] 05/08/2018, 3:19 PM  Cc: Glenford Bayley, DO

## 2018-05-09 ENCOUNTER — Telehealth: Payer: Self-pay | Admitting: Physician Assistant

## 2018-05-09 NOTE — Telephone Encounter (Signed)
Spoke with patient to give her lab results. She states she played tennis today. After playing, she had a itchy, rash on her breast at the bra area. She showered and took the bra off. Rash is going away.

## 2018-05-09 NOTE — Progress Notes (Signed)
I agree with the above note, plan 

## 2018-05-10 ENCOUNTER — Ambulatory Visit (INDEPENDENT_AMBULATORY_CARE_PROVIDER_SITE_OTHER)
Admission: RE | Admit: 2018-05-10 | Discharge: 2018-05-10 | Disposition: A | Discharge status: Home / Self Care | Payer: 59 | Source: Ambulatory Visit | Attending: Physician Assistant | Admitting: Physician Assistant

## 2018-05-10 DIAGNOSIS — R1031 Right lower quadrant pain: Secondary | ICD-10-CM

## 2018-05-10 DIAGNOSIS — R509 Fever, unspecified: Secondary | ICD-10-CM | POA: Diagnosis not present

## 2018-05-10 DIAGNOSIS — K50119 Crohn's disease of large intestine with unspecified complications: Secondary | ICD-10-CM | POA: Diagnosis not present

## 2018-05-10 DIAGNOSIS — K5090 Crohn's disease, unspecified, without complications: Secondary | ICD-10-CM | POA: Diagnosis not present

## 2018-05-10 MED ORDER — IOPAMIDOL (ISOVUE-300) INJECTION 61%
100.0000 mL | Freq: Once | INTRAVENOUS | Status: AC | PRN
  Administered 2018-05-10: 100 mL via INTRAVENOUS

## 2018-05-11 ENCOUNTER — Other Ambulatory Visit: Payer: Self-pay

## 2018-05-11 ENCOUNTER — Telehealth: Payer: Self-pay

## 2018-05-11 NOTE — Telephone Encounter (Signed)
I agree, also please have her stop the apriso, it is not likely helping much anyway.  Thanks

## 2018-05-11 NOTE — Telephone Encounter (Signed)
Pt notified and aware. She has taken the Apriso today already, but will stop taking from this day onwards.

## 2018-05-11 NOTE — Telephone Encounter (Signed)
Mrs.Mounger came today for the Prevnar 23 shot. Pt has developed hives starting on Wednesday. They are covering here torso, chest, hands and feet. Discussed with Amy and Anderson Malta PAs. She was advised to start Benadryl OTC 1 tab every 6-8 hours until hives resolve along with Pepcid over the counter 1 tab twice a day until hives resolve. She will call Monday with an update. Prevnar 23 injection not given today.

## 2018-05-13 LAB — THIOPURINE METHYLTRANSFERASE (TPMT), RBC: THIOPURINE METHYLTRANSFERASE, RBC: 10 nmol/h/mL — AB

## 2018-05-14 ENCOUNTER — Ambulatory Visit (INDEPENDENT_AMBULATORY_CARE_PROVIDER_SITE_OTHER): Payer: 59 | Admitting: Gastroenterology

## 2018-05-14 ENCOUNTER — Other Ambulatory Visit: Payer: Self-pay

## 2018-05-14 DIAGNOSIS — Z23 Encounter for immunization: Secondary | ICD-10-CM | POA: Diagnosis not present

## 2018-05-14 DIAGNOSIS — K50819 Crohn's disease of both small and large intestine with unspecified complications: Secondary | ICD-10-CM

## 2018-05-14 NOTE — Telephone Encounter (Signed)
The pt has been advised and will come in today for prevnar 13

## 2018-05-14 NOTE — Telephone Encounter (Signed)
Pt states her hives have gone away and is asking if she can come in for her shot today. Best call back# [phone].

## 2018-05-14 NOTE — Telephone Encounter (Signed)
Please advise 

## 2018-05-14 NOTE — Telephone Encounter (Signed)
Dr Ardis Hughs is it ok for the pt to have her prevnar injection today.

## 2018-05-14 NOTE — Telephone Encounter (Signed)
Yes, ok for prevnar 13

## 2018-05-16 ENCOUNTER — Other Ambulatory Visit: Payer: Self-pay

## 2018-05-16 MED ORDER — ADALIMUMAB 40 MG/0.8ML ~~LOC~~ AJKT: 160.0000 mg | AUTO-INJECTOR | SUBCUTANEOUS | 6 refills | Status: DC

## 2018-05-17 ENCOUNTER — Other Ambulatory Visit: Payer: Self-pay

## 2018-05-17 ENCOUNTER — Telehealth: Payer: Self-pay | Admitting: Gastroenterology

## 2018-05-17 MED ORDER — ADALIMUMAB 40 MG/0.8ML ~~LOC~~ AJKT: 160.0000 mg | AUTO-INJECTOR | SUBCUTANEOUS | 6 refills | Status: DC

## 2018-05-17 NOTE — Telephone Encounter (Signed)
Pt would like an appt for a humira teaching on 05/23/18.

## 2018-05-17 NOTE — Telephone Encounter (Signed)
The pt has been advised that Humira will set up the teaching for injections.  If she does not get an answer for the teaching she will call back and I will look into that for her.

## 2018-05-24 ENCOUNTER — Other Ambulatory Visit: Payer: Self-pay | Admitting: Gastroenterology

## 2018-05-25 NOTE — Progress Notes (Deleted)
   Subjective:    Patient ID: Angela [name], female    DOB: January 12, 1956, 62 y.o.   MRN: [id number]  No chief complaint on file.    HPI:  Angela [name] is a 62 y.o. female who presents today for a follow up appointment for fever and right lower abdominal pain.   Angela [name] was last seen in the office on 02/06/18 with fevers of unclear origin going on for 3.5 months with fevers ranging from 100-103. Blood work returned      Allergies  Allergen Reactions  . Penicillins Hives    Has patient had a PCN reaction causing immediate rash, facial/tongue/throat swelling, SOB or lightheadedness with hypotension: Yes Has patient had a PCN reaction causing severe rash involving mucus membranes or skin necrosis: No Has patient had a PCN reaction that required hospitalization: No Has patient had a PCN reaction occurring within the last 10 years: No If all of the above answers are "NO", then may proceed with Cephalosporin use.       Outpatient Medications Prior to Visit  Medication Sig Dispense Refill  . Adalimumab (HUMIRA PEN-CD/UC/HS STARTER) 40 MG/0.8ML PNKT Inject 160 mg into the skin as directed. 160 mg day 1, 80 mg day 14, 40 mg day 28, then 40 mg every 2 wks 3 each 6  . atorvastatin (LIPITOR) 40 MG tablet Take 40 mg by mouth daily.    Marland Kitchen lisinopril-hydrochlorothiazide (PRINZIDE,ZESTORETIC) 20-25 MG tablet Take 1 tablet by mouth daily.    . mesalamine (APRISO) 0.375 g 24 hr capsule Take 1,500 mg by mouth daily.    . predniSONE (DELTASONE) 10 MG tablet TAKE 4 TABLETS (40 MG TOTAL) BY MOUTH DAILY WITH BREAKFAST. 100 tablet 0   No facility-administered medications prior to visit.      Past Medical History:  Diagnosis Date  . Crohn's colitis ([place])   . Family history of breast cancer   . Hyperlipidemia   . Hypertension      Past Surgical History:  Procedure Laterality Date  . CESAREAN SECTION    . CHOLECYSTECTOMY  2011  . KNEE SURGERY Right   . SPINE SURGERY     Herniated Disc  .  tummy tuck  2001       Review of Systems    Objective:    There were no vitals taken for this visit. Nursing note and vital signs reviewed.  Physical Exam     Assessment & Plan:   Problem List Items Addressed This Visit    None       I am having Angela [name] maintain her atorvastatin, lisinopril-hydrochlorothiazide, mesalamine, Adalimumab, and predniSONE.   No orders of the defined types were placed in this encounter.    Follow-up: No follow-ups on file.   Terri Piedra, MSN, [facility]

## 2018-05-28 ENCOUNTER — Ambulatory Visit: Payer: 59 | Admitting: Family

## 2018-05-31 ENCOUNTER — Encounter: Payer: Self-pay | Admitting: Gastroenterology

## 2018-06-04 DIAGNOSIS — E782 Mixed hyperlipidemia: Secondary | ICD-10-CM | POA: Diagnosis not present

## 2018-06-04 DIAGNOSIS — I10 Essential (primary) hypertension: Secondary | ICD-10-CM | POA: Diagnosis not present

## 2018-06-04 DIAGNOSIS — N183 Chronic kidney disease, stage 3 (moderate): Secondary | ICD-10-CM | POA: Diagnosis not present

## 2018-06-05 ENCOUNTER — Encounter: Payer: Self-pay | Admitting: Gastroenterology

## 2018-06-05 ENCOUNTER — Ambulatory Visit (INDEPENDENT_AMBULATORY_CARE_PROVIDER_SITE_OTHER): Payer: 59 | Admitting: Gastroenterology

## 2018-06-05 VITALS — BP 90/60 | HR 80 | Ht 65.0 in | Wt 152.2 lb

## 2018-06-05 DIAGNOSIS — K50819 Crohn's disease of both small and large intestine with unspecified complications: Secondary | ICD-10-CM

## 2018-06-05 NOTE — Progress Notes (Signed)
Review of pertinent gastrointestinal problems: 1. Crohns ileocolitis: Diagnosed 2019 with a inflammatory process in the right lower quadrant involving the cecum and terminal ileum.  Eventual colonoscopy with Dr. Elta Guadeloupe may God April 2019 showed "congested, red, pseudopolypoid and scar area in the ascending colon, the proximal ascending colon and the IC valve, congested mucosa in the distal sigmoid" endoscopically this was suspicious for Crohn's disease.  Pathology showed erosion, focal active cryptitis and mild crypt or distortion.  No granulomas.  She had a hyperplastic polyp removed.  [facility] IBD testing was "positive for Crohn's disease".  She was evaluated by Dr. Annye English at [facility] surgery he recommended second opinion GI and she transferred her care to low [facility].  I did feel that she had Crohn's ileocolitis and started her on Humira June 2019  Humira start June 2019  TB quant gold negative June 2019  Hepatitis B surface antibody and antigen were -June 2019  Pneumococcal immunizations started June 2019 with Prevnar 13.    HPI: This is a very pleasant 62 year old woman whom we last saw here about a month ago.  She started Humira at usual loading dose.  Her second induction dosing is tomorrow.  Chief complaint is Crohn's ileocolitis  She feels great overall.  Prednisone course, she was feeling better on that.  REstarted prednisone 36m once daily after accidental stoppage of 4 days.  Due for second humira tomorrow.  Bowels are fine.  No fevers or chills.  Weight up 6 pounds in past month.  ROS: complete GI ROS as described in HPI, all other review negative.  Constitutional:  No unintentional weight loss   Past Medical History:  Diagnosis Date  . Crohn's colitis ([place]   . Family history of breast cancer   . Hyperlipidemia   . Hypertension     Past Surgical History:  Procedure Laterality Date  . CESAREAN SECTION    . CHOLECYSTECTOMY  2011  . KNEE SURGERY  Right   . SPINE SURGERY     Herniated Disc  . tummy tuck  2001    Current Outpatient Medications  Medication Sig Dispense Refill  . Adalimumab (HUMIRA PEN-CD/UC/HS STARTER) 40 MG/0.8ML PNKT Inject 160 mg into the skin as directed. 160 mg day 1, 80 mg day 14, 40 mg day 28, then 40 mg every 2 wks 3 each 6  . atorvastatin (LIPITOR) 40 MG tablet Take 40 mg by mouth daily.    .Marland Kitchenlisinopril-hydrochlorothiazide (PRINZIDE,ZESTORETIC) 20-25 MG tablet Take 1 tablet by mouth daily.    . predniSONE (DELTASONE) 10 MG tablet TAKE 4 TABLETS (40 MG TOTAL) BY MOUTH DAILY WITH BREAKFAST. 100 tablet 0   No current facility-administered medications for this visit.     Allergies as of 06/05/2018 - Review Complete 06/05/2018  Allergen Reaction Noted  . Penicillins Hives 05/29/2014    Family History  Problem Relation Age of Onset  . Breast cancer Mother   . Esophageal cancer Father   . Leukemia Sister 5  . Breast cancer Maternal Aunt 433 . Breast cancer Maternal Grandmother        dx in her late 617s . Heart attack Paternal Grandfather   . Cervical cancer Sister   . Colon cancer Neg Hx     Social History   Socioeconomic History  . Marital status: Married    Spouse name: Not on file  . Number of children: 3  . Years of education: 134 . Highest education level: Not on file  Occupational History  . Occupation: Cabin crew  Social Needs  . Financial resource strain: Not on file  . Food insecurity:    Worry: Not on file    Inability: Not on file  . Transportation needs:    Medical: Not on file    Non-medical: Not on file  Tobacco Use  . Smoking status: Former Smoker    Years: 20.00  . Smokeless tobacco: Never Used  Substance and Sexual Activity  . Alcohol use: Yes    Comment: occasionally  . Drug use: No  . Sexual activity: Yes  Lifestyle  . Physical activity:    Days per week: Not on file    Minutes per session: Not on file  . Stress: Not on file  Relationships  . Social  connections:    Talks on phone: Not on file    Gets together: Not on file    Attends religious service: Not on file    Active member of club or organization: Not on file    Attends meetings of clubs or organizations: Not on file    Relationship status: Not on file  . Intimate partner violence:    Fear of current or ex partner: Not on file    Emotionally abused: Not on file    Physically abused: Not on file    Forced sexual activity: Not on file  Other Topics Concern  . Not on file  Social History Narrative  . Not on file     Physical Exam: BP 90/60 (BP Location: Left Arm, Patient Position: Sitting, Cuff Size: Normal)   Pulse 80   Ht 5' 5"  (1.651 m) Comment: height measured without shoes  Wt 152 lb 4 oz (69.1 kg)   BMI 25.34 kg/m  Constitutional: generally well-appearing Psychiatric: alert and oriented x3 Abdomen: soft, nontender, nondistended, no obvious ascites, no peritoneal signs, normal bowel sounds No peripheral edema noted in lower extremities  Assessment and plan: 62 y.o. female with Crohn's ileocolitis  She is going to continue with Humira loading, second dose is tomorrow  And then she will start every other week 40 mg maintenance therapy.    She is going to begin tapering prednisone by 10 mg/week starting tomorrow as well.  She will return to see me in 2 months and sooner if needed.    Please see the "Patient Instructions" section for addition details about the plan.  Owens Loffler, MD [facility] 06/05/2018, 2:21 PM

## 2018-06-05 NOTE — Patient Instructions (Addendum)
Continue with humira.  Goal is 16m every two weeks. Start tapering prednisone by 122mevery week.  After you have taken 1018maily for a week, then the last week should be at 5mg39mednisone once daily.  Please return to see Dr. JacoArdis Hughs2 months.  Normal BMI (Body Mass Index- based on height and weight) is between 19 and 25. Your BMI today is Body mass index is 25.34 kg/m. . PlMarland Kitchenase consider follow up  regarding your BMI with your Primary Care Provider.

## 2018-07-20 ENCOUNTER — Telehealth: Payer: Self-pay

## 2018-07-20 ENCOUNTER — Other Ambulatory Visit: Payer: Self-pay

## 2018-07-20 DIAGNOSIS — K50819 Crohn's disease of both small and large intestine with unspecified complications: Secondary | ICD-10-CM

## 2018-07-20 MED ORDER — ADALIMUMAB 40 MG/0.8ML ~~LOC~~ AJKT: 40.0000 mg | AUTO-INJECTOR | SUBCUTANEOUS | 6 refills | Status: DC

## 2018-07-20 MED ORDER — ADALIMUMAB 40 MG/0.4ML ~~LOC~~ AJKT: 40.0000 mg | AUTO-INJECTOR | SUBCUTANEOUS | 6 refills | Status: DC

## 2018-07-20 NOTE — Addendum Note (Signed)
Addended by: Timothy Lasso on: 07/20/2018 03:32 PM   Modules accepted: Orders

## 2018-07-20 NOTE — Telephone Encounter (Signed)
The pt humira has been sent as citrate free and labs added to Epic.  She will also begin benadryl 25 mg at night.  She will keep appt as scheduled.

## 2018-07-20 NOTE — Telephone Encounter (Signed)
I'm happy to prescribe the newer version of humira for her. Can you look into that . Also ask her if she has tried taking a single benedryl 25mg pill QHS for her itching. She is supposed to be seeing me in office in a few weeks, would like cbc, cmet, esr a few days prior to that appt. Thanks 

## 2018-08-13 ENCOUNTER — Other Ambulatory Visit (INDEPENDENT_AMBULATORY_CARE_PROVIDER_SITE_OTHER): Payer: 59

## 2018-08-13 DIAGNOSIS — K50819 Crohn's disease of both small and large intestine with unspecified complications: Secondary | ICD-10-CM

## 2018-08-13 LAB — CBC WITH DIFFERENTIAL/PLATELET
BASOS ABS: 0.1 10*3/uL (ref 0.0–0.1)
BASOS PCT: 1.4 % (ref 0.0–3.0)
EOS ABS: 0.2 10*3/uL (ref 0.0–0.7)
Eosinophils Relative: 4.3 % (ref 0.0–5.0)
HCT: 43.3 % (ref 36.0–46.0)
Hemoglobin: 14.6 g/dL (ref 12.0–15.0)
Lymphocytes Relative: 34 % (ref 12.0–46.0)
Lymphs Abs: 1.9 10*3/uL (ref 0.7–4.0)
MCHC: 33.6 g/dL (ref 30.0–36.0)
MCV: 88.7 fl (ref 78.0–100.0)
MONO ABS: 0.5 10*3/uL (ref 0.1–1.0)
Monocytes Relative: 8.5 % (ref 3.0–12.0)
NEUTROS ABS: 2.8 10*3/uL (ref 1.4–7.7)
NEUTROS PCT: 51.8 % (ref 43.0–77.0)
PLATELETS: 292 10*3/uL (ref 150.0–400.0)
RBC: 4.88 Mil/uL (ref 3.87–5.11)
RDW: 14.6 % (ref 11.5–15.5)
WBC: 5.5 10*3/uL (ref 4.0–10.5)

## 2018-08-13 LAB — COMPREHENSIVE METABOLIC PANEL
ALBUMIN: 4.2 g/dL (ref 3.5–5.2)
ALT: 30 U/L (ref 0–35)
AST: 19 U/L (ref 0–37)
Alkaline Phosphatase: 65 U/L (ref 39–117)
BILIRUBIN TOTAL: 0.4 mg/dL (ref 0.2–1.2)
BUN: 19 mg/dL (ref 6–23)
CALCIUM: 9.6 mg/dL (ref 8.4–10.5)
CHLORIDE: 105 meq/L (ref 96–112)
CO2: 27 mEq/L (ref 19–32)
CREATININE: 0.96 mg/dL (ref 0.40–1.20)
GFR: 63.01 mL/min (ref >60.00)
Glucose, Bld: 115 mg/dL — ABNORMAL HIGH (ref 70–99)
Potassium: 4.2 mEq/L (ref 3.5–5.1)
SODIUM: 140 meq/L (ref 135–145)
Total Protein: 7.2 g/dL (ref 6.0–8.3)

## 2018-08-13 LAB — SEDIMENTATION RATE: Sed Rate: 10 mm/hr (ref 0–30)

## 2018-08-14 ENCOUNTER — Telehealth: Payer: Self-pay | Admitting: Gastroenterology

## 2018-08-14 NOTE — Telephone Encounter (Signed)
Spoke to patient regarding her labs. She voiced understanding.

## 2018-08-14 NOTE — Telephone Encounter (Signed)
Patient returning [facility] call about lab results from yesterday 9.23.19.

## 2018-08-15 ENCOUNTER — Other Ambulatory Visit: Payer: 59

## 2018-08-15 ENCOUNTER — Ambulatory Visit (INDEPENDENT_AMBULATORY_CARE_PROVIDER_SITE_OTHER): Payer: 59 | Admitting: Gastroenterology

## 2018-08-15 ENCOUNTER — Encounter: Payer: Self-pay | Admitting: Gastroenterology

## 2018-08-15 ENCOUNTER — Other Ambulatory Visit: Payer: Self-pay

## 2018-08-15 ENCOUNTER — Ambulatory Visit: Payer: 59 | Admitting: Gastroenterology

## 2018-08-15 VITALS — BP 118/74 | HR 68 | Ht 65.0 in | Wt 171.4 lb

## 2018-08-15 DIAGNOSIS — K50819 Crohn's disease of both small and large intestine with unspecified complications: Secondary | ICD-10-CM

## 2018-08-15 NOTE — Progress Notes (Signed)
Review of pertinent gastrointestinal problems: 1. Crohns ileocolitis: Diagnosed 2019 with a inflammatory process in the right lower quadrant involving the cecum and terminal ileum.  Eventual colonoscopy with Dr. Lizbeth Bark April 2019 showed "congested, red, pseudopolypoid and scar area in the ascending colon, the proximal ascending colon and the IC valve, congested mucosa in the distal sigmoid" endoscopically this was suspicious for Crohn's disease.  Pathology showed erosion, focal active cryptitis and mild crypt or distortion.  No granulomas.  She had a hyperplastic polyp removed.  [facility] IBD testing was "positive for Crohn's disease".  She was evaluated by Dr. Annye English at [facility] surgery he recommended second opinion GI and she transferred her care to [facility].  I did feel that she had Crohn's ileocolitis and started her on Humira June 2019  Humira start June 2019  TB quant gold negative June 2019  Hepatitis B surface antibody and antigen were -June 2019  Pneumococcal immunizations started June 2019 with Prevnar 13.    HPI: This is a very pleasant 62 year old woman whom I last saw about 2 months ago shortly after she had started Humira for Crohn's ileocolitis.  She weaned off her prednisone and was taking maintenance Humira dosing.  Unfortunately she has had several side effects from it.  Significant injection site pain.  Also extreme myalgias.  Also double vision.  She was also bothered by coughing.  These symptoms seem to get worse as she stayed on Humira.  She called and we discussed that and I recommended she stop the Humira.  She has skipped 1 dose and was due for her next dose tomorrow.  All of her side effects have either completely resolved or significantly improved.  Unfortunately her bowels which were under very good control while on the Humira have started becoming more frequent and urgent again.  She is bothered by a little bit of lower abdominal cramping as  well.  Chief complaint is Crohn's ileocolitis  Labs yesterday were all normal, this included a CBC, complete metabolic profile and sedimentation rate  Five liquid BMS daily, with urgency.  Was 1-2 solid stools before she stopped the Humira.  Diarrhea was gone, abd pain was gone, no fevers.   ROS: complete GI ROS as described in HPI, all other review negative.  Constitutional:  No unintentional weight loss   Past Medical History:  Diagnosis Date  . Crohn's colitis ([place])   . Family history of breast cancer   . Hyperlipidemia   . Hypertension     Past Surgical History:  Procedure Laterality Date  . CESAREAN SECTION    . CHOLECYSTECTOMY  2011  . KNEE SURGERY Right   . SPINE SURGERY     Herniated Disc  . tummy tuck  2001    Current Outpatient Medications  Medication Sig Dispense Refill  . atorvastatin (LIPITOR) 40 MG tablet Take 40 mg by mouth daily.    Marland Kitchen lisinopril (PRINIVIL,ZESTRIL) 40 MG tablet Take 40 mg by mouth daily.     No current facility-administered medications for this visit.     Allergies as of 08/15/2018 - Review Complete 08/15/2018  Allergen Reaction Noted  . Penicillins Hives 05/29/2014    Family History  Problem Relation Age of Onset  . Breast cancer Mother   . Esophageal cancer Father   . Leukemia Sister 5  . Breast cancer Maternal Aunt 60  . Breast cancer Maternal Grandmother        dx in her late 13s  . Heart attack  Paternal Grandfather   . Cervical cancer Sister   . Colon cancer Neg Hx     Social History   Socioeconomic History  . Marital status: Married    Spouse name: Not on file  . Number of children: 3  . Years of education: 15  . Highest education level: Not on file  Occupational History  . Occupation: Cabin crew  Social Needs  . Financial resource strain: Not on file  . Food insecurity:    Worry: Not on file    Inability: Not on file  . Transportation needs:    Medical: Not on file    Non-medical: Not on file  Tobacco  Use  . Smoking status: Former Smoker    Years: 20.00  . Smokeless tobacco: Never Used  Substance and Sexual Activity  . Alcohol use: Yes    Comment: occasionally  . Drug use: No  . Sexual activity: Yes  Lifestyle  . Physical activity:    Days per week: Not on file    Minutes per session: Not on file  . Stress: Not on file  Relationships  . Social connections:    Talks on phone: Not on file    Gets together: Not on file    Attends religious service: Not on file    Active member of club or organization: Not on file    Attends meetings of clubs or organizations: Not on file    Relationship status: Not on file  . Intimate partner violence:    Fear of current or ex partner: Not on file    Emotionally abused: Not on file    Physically abused: Not on file    Forced sexual activity: Not on file  Other Topics Concern  . Not on file  Social History Narrative  . Not on file     Physical Exam: BP 118/74   Pulse 68   Ht 5' 5"  (1.651 m)   Wt 171 lb 6 oz (77.7 kg)   BMI 28.52 kg/m  Constitutional: generally well-appearing Psychiatric: alert and oriented x3 Abdomen: soft, nontender, nondistended, no obvious ascites, no peritoneal signs, normal bowel sounds No peripheral edema noted in lower extremities  Assessment and plan: 62 y.o. female with Crohn's ileocolitis  Humira seem to be working quite well for her bowel symptoms unfortunately she was having significant side effects including extreme myalgias, coughing, double vision.  Was also having some typical injection site pain.  After holding Humira her side effects have nearly completely gone away but her bowel symptoms have returned.  She is going to have a GI pathogen panel sent to make sure she does not have occult infection causing her now looser stools with urgency.  I am also going to get her ready to switch from Humira to newer biologic Stelara at weight-based induction and then usual maintenance subcu therapy.  She will  return to see me in 4 to 6 weeks and sooner if any problems.  Please see the "Patient Instructions" section for addition details about the plan.  Owens Loffler, MD [facility] 08/15/2018, 3:29 PM

## 2018-08-15 NOTE — Patient Instructions (Addendum)
You will have labs checked today in the basement lab.  Please head down after you check out with the front desk  (gi pathogen panel).  Stop humira due to side effects.  Stelara new start: Induction 371m IV times one, then 974mSC every 8 weeks with first dose 8 weeks after the induction infusion.  Please return to see Dr. JaArdis Hughsn 6 weeks.  We have scheduled you for a follow up appointment with Dr JaArdis Hughsn 10/05/18 at 845am

## 2018-08-16 ENCOUNTER — Telehealth: Payer: Self-pay | Admitting: Gastroenterology

## 2018-08-16 ENCOUNTER — Other Ambulatory Visit: Payer: 59

## 2018-08-16 DIAGNOSIS — K50819 Crohn's disease of both small and large intestine with unspecified complications: Secondary | ICD-10-CM

## 2018-08-16 NOTE — Telephone Encounter (Signed)
Patient states [facility] called and states they need the referral resent to [phone] and attention to [facility].

## 2018-08-17 NOTE — Telephone Encounter (Signed)
I spoke with [facility] and she states that the fax machine was down at that office and the info was not received.  I have resent and advised the pt.

## 2018-08-20 LAB — GASTROINTESTINAL PATHOGEN PANEL PCR
C. DIFFICILE TOX A/B, PCR: NOT DETECTED
CAMPYLOBACTER, PCR: NOT DETECTED
Cryptosporidium, PCR: NOT DETECTED
E COLI (STEC) STX1/STX2, PCR: NOT DETECTED
E coli (ETEC) LT/ST PCR: NOT DETECTED
E coli 0157, PCR: NOT DETECTED
Giardia lamblia, PCR: NOT DETECTED
NOROVIRUS, PCR: NOT DETECTED
ROTAVIRUS, PCR: NOT DETECTED
Salmonella, PCR: NOT DETECTED
Shigella, PCR: NOT DETECTED

## 2018-08-22 DIAGNOSIS — Z79899 Other long term (current) drug therapy: Secondary | ICD-10-CM | POA: Diagnosis not present

## 2018-08-22 DIAGNOSIS — K5090 Crohn's disease, unspecified, without complications: Secondary | ICD-10-CM | POA: Diagnosis not present

## 2018-08-22 DIAGNOSIS — M1990 Unspecified osteoarthritis, unspecified site: Secondary | ICD-10-CM | POA: Diagnosis not present

## 2018-08-27 DIAGNOSIS — M25511 Pain in right shoulder: Secondary | ICD-10-CM | POA: Diagnosis not present

## 2018-08-27 DIAGNOSIS — M25561 Pain in right knee: Secondary | ICD-10-CM | POA: Diagnosis not present

## 2018-09-10 ENCOUNTER — Telehealth: Payer: Self-pay | Admitting: Gastroenterology

## 2018-09-10 NOTE — Telephone Encounter (Signed)
Patient states she can not get in contact with [facility] to schedule another infusion and has not had one since September. Patient calling for advice.

## 2018-09-10 NOTE — Telephone Encounter (Signed)
I called and spoke with [facility] and she advised that I would need to speak to Halma at [phone].  I left a message for her to return call.  Referral was faxed on 9/23 and 26.  She is a new start stelara.

## 2018-09-11 NOTE — Telephone Encounter (Signed)
The pt was called and she has an appt for next week.

## 2018-09-19 DIAGNOSIS — K5090 Crohn's disease, unspecified, without complications: Secondary | ICD-10-CM | POA: Diagnosis not present

## 2018-10-03 DIAGNOSIS — M1711 Unilateral primary osteoarthritis, right knee: Secondary | ICD-10-CM | POA: Diagnosis not present

## 2018-10-05 ENCOUNTER — Encounter: Payer: Self-pay | Admitting: Gastroenterology

## 2018-10-05 ENCOUNTER — Ambulatory Visit (INDEPENDENT_AMBULATORY_CARE_PROVIDER_SITE_OTHER): Payer: 59 | Admitting: Gastroenterology

## 2018-10-05 VITALS — BP 140/80 | HR 72 | Ht 60.0 in | Wt 179.0 lb

## 2018-10-05 DIAGNOSIS — K50819 Crohn's disease of both small and large intestine with unspecified complications: Secondary | ICD-10-CM

## 2018-10-05 NOTE — Progress Notes (Signed)
1.Crohns ileocolitis:Diagnosed 2019 with a inflammatory process in the right lower quadrant involving the cecum and terminal ileum. Eventual colonoscopy with Dr. Lizbeth Bark April 2019 showed "congested, red, pseudopolypoid and scar area in the ascending colon, the proximal ascending colon and the IC valve, congested mucosa in the distal sigmoid" endoscopically this was suspicious for Crohn's disease. Pathology showed erosion, focal active cryptitis and mild crypt or distortion. No granulomas. She had a hyperplastic polyp removed. [facility] IBD testing was "positive for Crohn's disease". She was evaluated by Dr. Annye English at [facility] surgery he recommended second opinion GI and she transferred her care to [facility]. I did feel that she had Crohn's ileocolitis and started her on Humira June 2019  Humira start June 2019: Significant side effects including extreme myalgias, double vision, significant injection site pain, flulike symptoms.  Changed to stelara 08/2018  TB quant gold negative June 2019  Hepatitis B surface antibody and antigen were -June 2019  Pneumococcal immunizations started June 2019 with Prevnar13.   HPI: This is a very pleasant 62 year old woman whom I last saw a month or 2 ago.  Bad experience at the infusion center for her Stelara induction infusion.  That was about 2 weeks ago.  Doing well from GI symptoms:  Has usually 1-2 BMs daily; becoming more solid, non bloody.,  No abd pains.  Chief complaint is Crohn's ileocolitis  Weight up 8 pounds since last visit  ROS: complete GI ROS as described in HPI, all other review negative.  Constitutional:  No unintentional weight loss   Past Medical History:  Diagnosis Date  . Crohn's colitis ([place])   . Family history of breast cancer   . Hyperlipidemia   . Hypertension     Past Surgical History:  Procedure Laterality Date  . CESAREAN SECTION    . CHOLECYSTECTOMY  2011  . KNEE SURGERY Right   .  SPINE SURGERY     Herniated Disc  . tummy tuck  2001    Current Outpatient Medications  Medication Sig Dispense Refill  . atorvastatin (LIPITOR) 40 MG tablet Take 40 mg by mouth daily.    Marland Kitchen lisinopril (PRINIVIL,ZESTRIL) 40 MG tablet Take 40 mg by mouth daily.    Marland Kitchen Ustekinumab (STELARA IV) Inject into the vein. Infusion     No current facility-administered medications for this visit.     Allergies as of 10/05/2018 - Review Complete 10/05/2018  Allergen Reaction Noted  . Humira [adalimumab] Other (See Comments) 10/05/2018  . Penicillins Hives 05/29/2014    Family History  Problem Relation Age of Onset  . Breast cancer Mother   . Esophageal cancer Father   . Leukemia Sister 5  . Breast cancer Maternal Aunt 32  . Breast cancer Maternal Grandmother        dx in her late 68s  . Heart attack Paternal Grandfather   . Cervical cancer Sister   . Colon cancer Neg Hx     Social History   Socioeconomic History  . Marital status: Married    Spouse name: Not on file  . Number of children: 3  . Years of education: 85  . Highest education level: Not on file  Occupational History  . Occupation: Cabin crew  Social Needs  . Financial resource strain: Not on file  . Food insecurity:    Worry: Not on file    Inability: Not on file  . Transportation needs:    Medical: Not on file    Non-medical: Not on file  Tobacco Use  . Smoking status: Former Smoker    Years: 20.00  . Smokeless tobacco: Never Used  Substance and Sexual Activity  . Alcohol use: Yes    Comment: occasionally  . Drug use: No  . Sexual activity: Yes  Lifestyle  . Physical activity:    Days per week: Not on file    Minutes per session: Not on file  . Stress: Not on file  Relationships  . Social connections:    Talks on phone: Not on file    Gets together: Not on file    Attends religious service: Not on file    Active member of club or organization: Not on file    Attends meetings of clubs or organizations:  Not on file    Relationship status: Not on file  . Intimate partner violence:    Fear of current or ex partner: Not on file    Emotionally abused: Not on file    Physically abused: Not on file    Forced sexual activity: Not on file  Other Topics Concern  . Not on file  Social History Narrative  . Not on file     Physical Exam: BP 140/80 (BP Location: Left Arm, Patient Position: Sitting, Cuff Size: Normal)   Pulse 72   Ht 5' (1.524 m)   Wt 179 lb (81.2 kg)   BMI 34.96 kg/m  Constitutional: generally well-appearing Psychiatric: alert and oriented x3 Abdomen: soft, nontender, nondistended, no obvious ascites, no peritoneal signs, normal bowel sounds No peripheral edema noted in lower extremities  Assessment and plan: 62 y.o. female with Crohn's ileocolitis  It took a bit longer than we had hoped but she was able to transition from Humira to [facility], her induction infusion was 2 weeks ago.  She has had really no side effects from the Stelara and her abdominal pain and bowels are improving.  She was starting to have some abdominal tenderness pains, some loosening of her stools since it took a bit longer than we had hoped for her to start on the Stelara.  Those symptoms are definitely improving.  She is having 1-2 soft, getting more solid bowel movements daily, no bleeding.  Her abdominal pain is nearly gone.  She feels really good overall.  She will continue with Stelara 90 mg subcu every 8 weeks and she will return to see me in 3 to 4 months and sooner if needed.  Please see the "Patient Instructions" section for addition details about the plan.  Owens Loffler, MD [facility] 10/05/2018, 9:05 AM

## 2018-10-05 NOTE — Patient Instructions (Addendum)
Stay on stelara (66m [place] every 8 weeks). Please return to see Dr. JArdis Hughsin 3-4 months.  Our office will contact you to schedule a follow up appointment with Dr JArdis Hughs Thank you for entrusting me with your care and [facility]  Dr JArdis Hughs

## 2018-10-09 DIAGNOSIS — M1711 Unilateral primary osteoarthritis, right knee: Secondary | ICD-10-CM | POA: Diagnosis not present

## 2018-10-17 DIAGNOSIS — M1711 Unilateral primary osteoarthritis, right knee: Secondary | ICD-10-CM | POA: Diagnosis not present

## 2018-10-19 IMAGING — CT CT ABD-PELV W/ CM
2 of 5 series · 16 of 46 positions shown, 18 images · IV contrast (iopamidol)
Comparison: 03/07/2018

CLINICAL DATA: Worsening lower abdominal and pelvic pain for 1
week. Nausea with vomiting. Followup right lower quadrant
inflammatory process.

EXAM:
CT ABDOMEN AND PELVIS WITH CONTRAST
TECHNIQUE: Multidetector CT imaging of the abdomen and pelvis was performed
using the standard protocol following bolus administration of
intravenous contrast.
CONTRAST:  100mL GTYHN1-OPP IOPAMIDOL (GTYHN1-OPP) INJECTION 61%

[Series 2: axial st · axial · 0.69mm/px · z∈[-464,-74]mm · 13 of 92 slices shown, 15 images]
[im 7/92  soft-tissue]
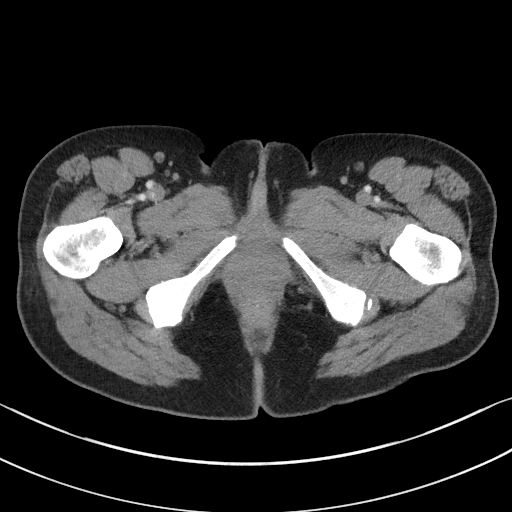
[im 7/92  bone]
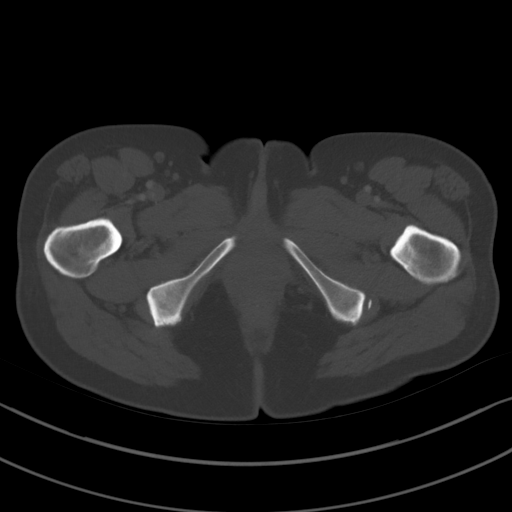
[im 13/92  soft-tissue]
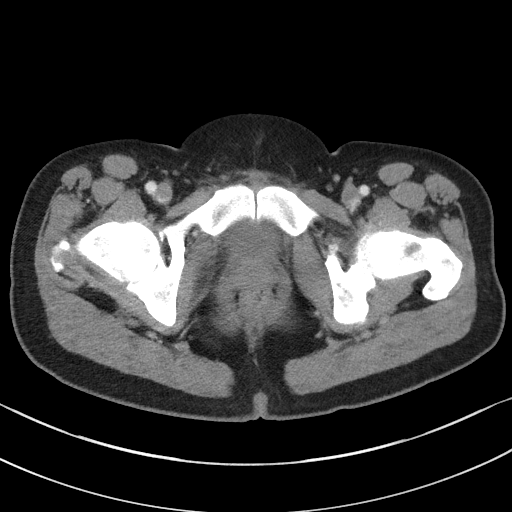
[im 19/92  soft-tissue]
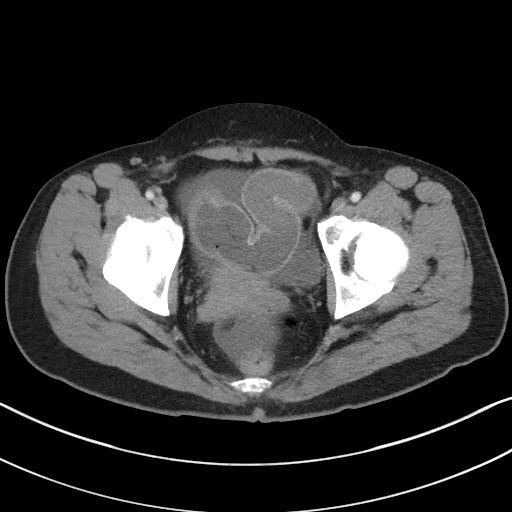
[im 25/92  soft-tissue]
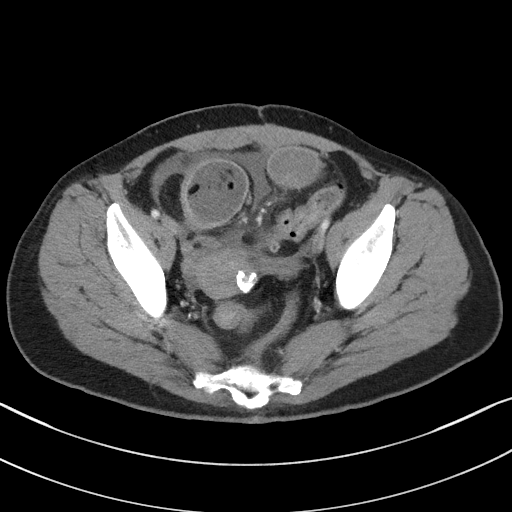
[im 31/92  soft-tissue]
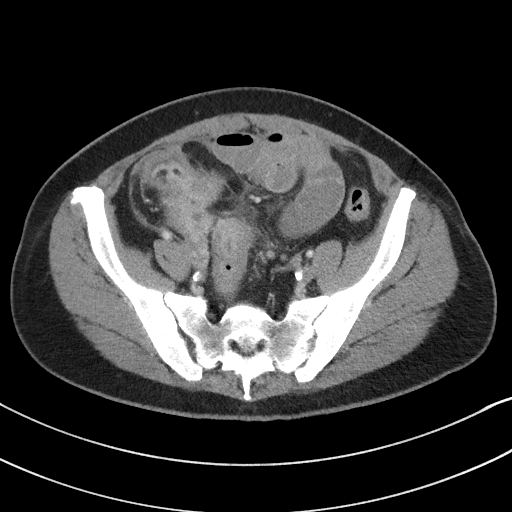
[im 37/92  soft-tissue]
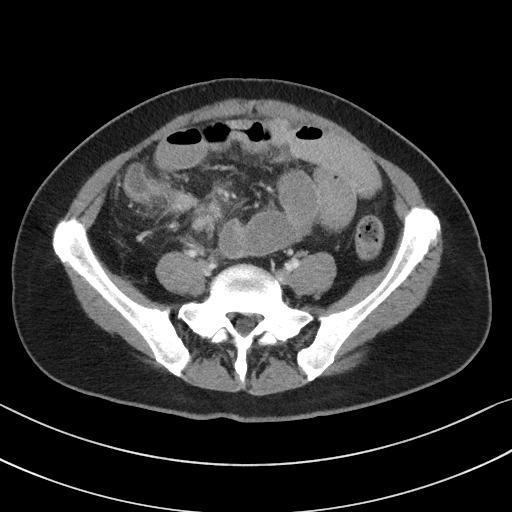
[im 49/92  soft-tissue]
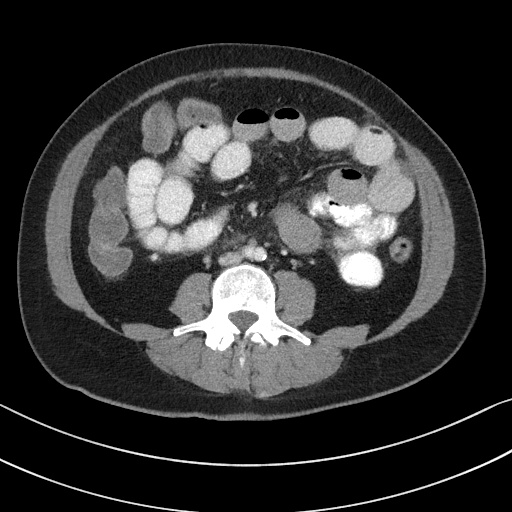
[im 55/92  soft-tissue]
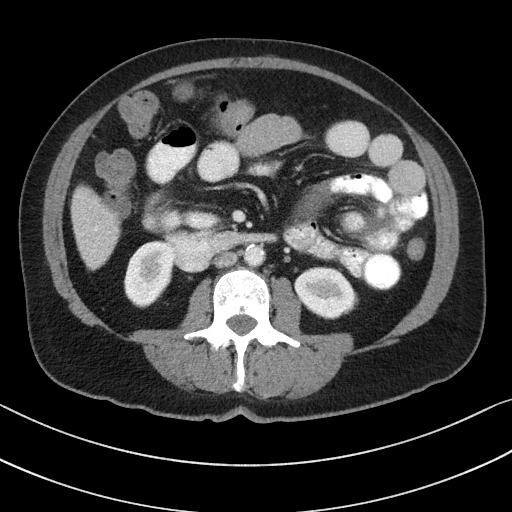
[im 61/92  soft-tissue]
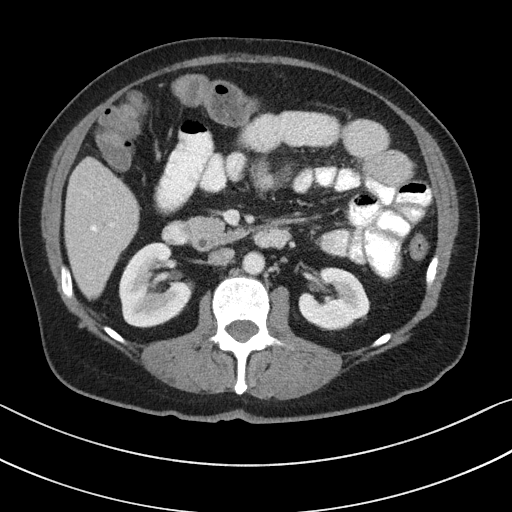
[im 61/92  bone]
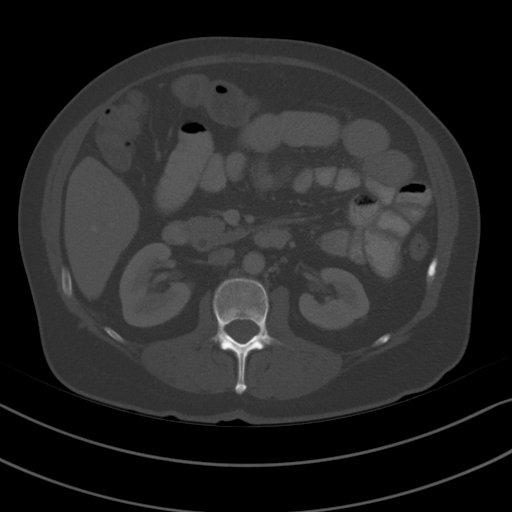
[im 67/92  soft-tissue]
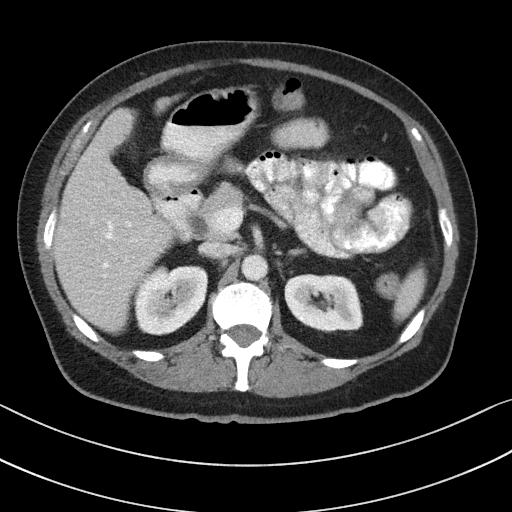
[im 73/92  soft-tissue]
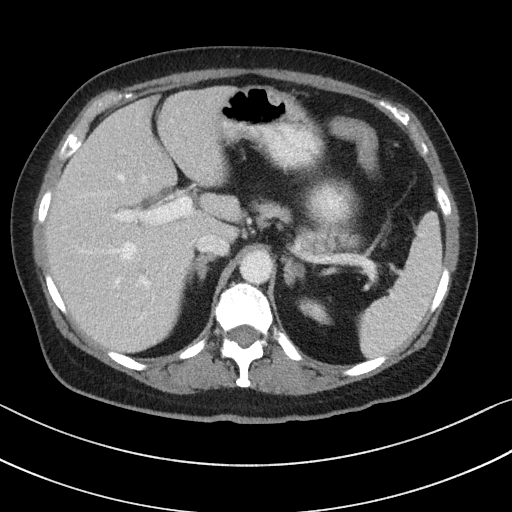
[im 79/92  soft-tissue]
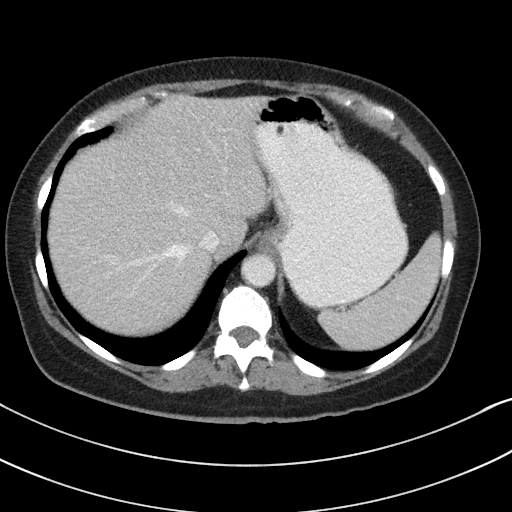
[im 85/92  soft-tissue]
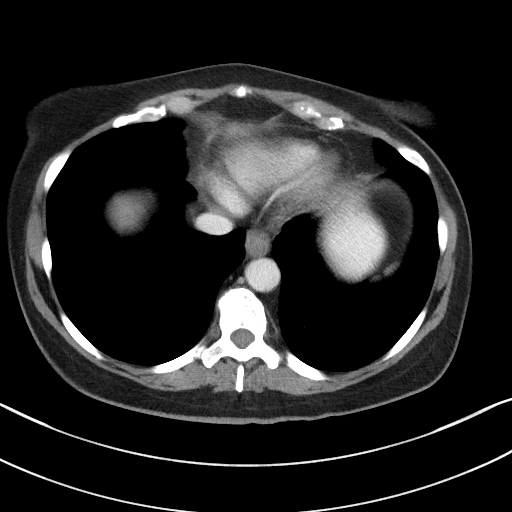

[Series 5: coronal st · coronal · 0.68mm/px · 3 of 94 slices shown]
[im 32/94  soft-tissue]
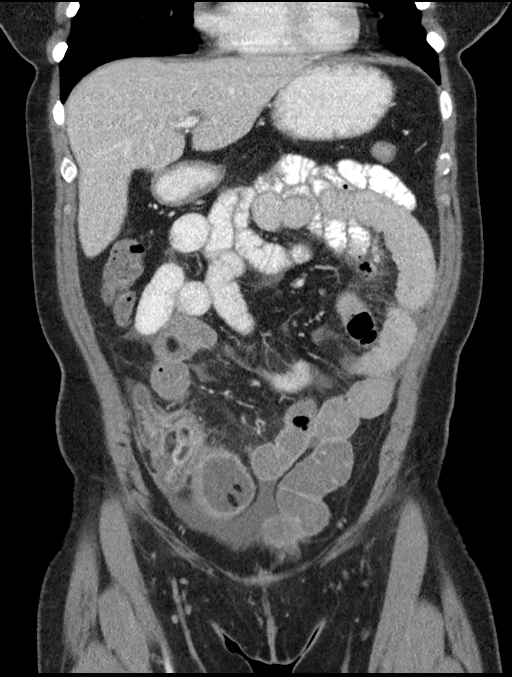
[im 42/94  soft-tissue]
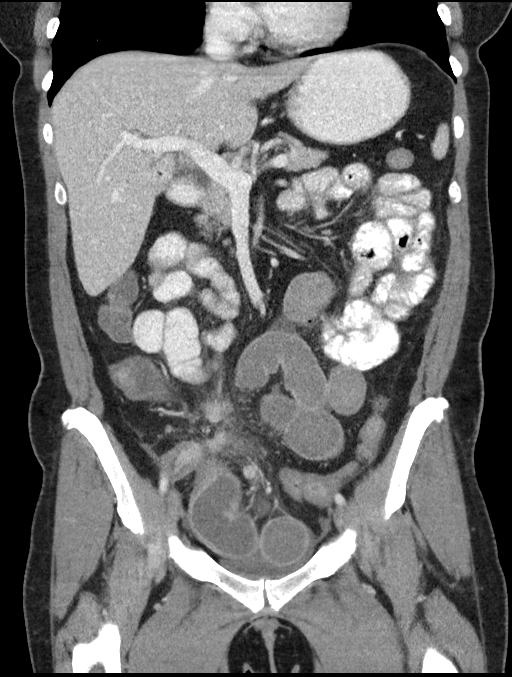
[im 52/94  soft-tissue]
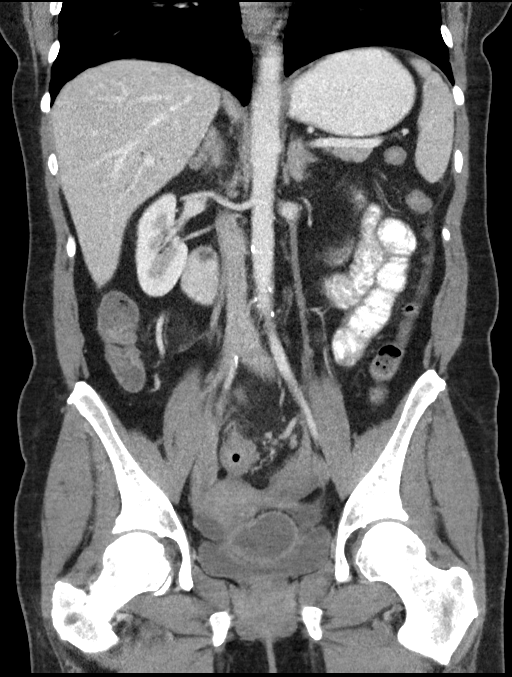

[16 of 46 positions shown; findings below may reference images not displayed]

FINDINGS: Lower Chest: No acute findings.

Hepatobiliary: No hepatic masses identified. Prior cholecystectomy.
No evidence of biliary obstruction.

Pancreas:  No mass or inflammatory changes.

Spleen: Within normal limits in size and appearance.

Adrenals/Urinary Tract: No masses identified. No evidence of
hydronephrosis.

Stomach/Bowel: Multiple mildly dilated small bowel loops are seen
with transition point in the right lower quadrant in the distal
ileum, consistent with partial or low-grade small bowel obstruction.
Inflammatory process in the right lower quadrant shows no
significant change. The appendix is not well visualized. No abscess
identified. Tiny amount of pelvic free fluid is new since prior
exam.

Vascular/Lymphatic: No pathologically enlarged lymph nodes. No
abdominal aortic aneurysm. Aortic atherosclerosis.

Reproductive: Stable 1.5 cm calcified fibroid in the left uterine
corpus. No adnexal mass identified.

Other:  None.

Musculoskeletal:  No suspicious bone lesions identified.
IMPRESSION: Right lower quadrant inflammatory process shows no significant
change. Differential diagnosis includes ruptured appendicitis and
Crohn's disease.

Increased small amount of pelvic free fluid. No evidence of abscess.

Partial or low-grade small bowel obstruction, with transition point
in the distal ileum.

## 2018-11-16 DIAGNOSIS — K5090 Crohn's disease, unspecified, without complications: Secondary | ICD-10-CM | POA: Diagnosis not present

## 2018-12-03 DIAGNOSIS — M1711 Unilateral primary osteoarthritis, right knee: Secondary | ICD-10-CM | POA: Diagnosis not present

## 2019-01-11 DIAGNOSIS — K5090 Crohn's disease, unspecified, without complications: Secondary | ICD-10-CM | POA: Diagnosis not present

## 2019-03-20 ENCOUNTER — Other Ambulatory Visit: Payer: Self-pay

## 2019-03-20 ENCOUNTER — Ambulatory Visit (INDEPENDENT_AMBULATORY_CARE_PROVIDER_SITE_OTHER): Payer: 59 | Admitting: Gastroenterology

## 2019-03-20 VITALS — HR 72 | Ht 65.0 in | Wt 180.0 lb

## 2019-03-20 DIAGNOSIS — K50819 Crohn's disease of both small and large intestine with unspecified complications: Secondary | ICD-10-CM

## 2019-03-20 NOTE — Progress Notes (Signed)
1.Crohns ileocolitis:Diagnosed 2019 with a inflammatory process in the right lower quadrant involving the cecum and terminal ileum. Eventual colonoscopy with Dr. Dulce Sellar April 2019 showed "congested, red, pseudopolypoid and scar area in the ascending colon, the proximal ascending colon and the IC valve, congested mucosa in the distal sigmoid" endoscopically this was suspicious for Crohn's disease. Pathology showed erosion, focal active cryptitis and mild crypt or distortion. No granulomas. She had a hyperplastic polyp removed. [facility] IBD testing was "positive for Crohn's disease". She was evaluated by Dr. Annye English at [facility] surgery he recommended second opinion GI and she transferred her care to [facility]. I did feel that she had Crohn's ileocolitis and started her on Humira June 2019  Humira start June 2019: Significant side effects including extreme myalgias, double vision, significant injection site pain, flulike symptoms.  Changed to stelara 08/2018  TB quant gold negative June 2019  Hepatitis B surface antibody and antigen were -June 2019  Pneumococcal immunizations started June 2019 with Prevnar13.   This service was provided via virtual visit.  I tried contacting her by A/V however she did not answer quickly enough and so only audio was used.  The patient was located at home.  I was located in my office.  The patient did consent to this virtual visit and is aware of possible charges through their insurance for this visit.  The patient is an established patient.  My certified medical assistant, Grace Bushy, contributed to this visit by contacting the patient by phone 1 or 2 business days prior to the appointment and also followed up on the recommendations I made after the visit.  Time spent on virtual visit: 24 min   HPI: This is a very pleasant 63 year old woman with Crohn's ileocolitis.   I last saw her about 5 months ago here in the office, in person.  She  was doing well since the Stelara induction infusion which had been 2 weeks prior since then she has been continuing Stelara subcutaneous 90 mg injection every 8 weeks.  The stelara seems to work well for 5 weeks (bowels are better, less frequent but still soft, pains definitely better weeks 2-5), then starting week 6 she starts feeling poorly.  Still has soft unformed stool with some urgency.  Goes 3-4 times per day. One is a quite large. She has intermittent abd pains.  Definitely not as good as the humira.  Her most recent shot was 1-2 weeks ago.  She is not happy with the place where she is getting the shots.  Chief complaint is Crohn's ileocolitis  ROS: complete GI ROS as described in HPI, all other review negative.  Constitutional:  No unintentional weight loss   Past Medical History:  Diagnosis Date  . Crohn's colitis ([place])   . Family history of breast cancer   . Hyperlipidemia   . Hypertension     Past Surgical History:  Procedure Laterality Date  . CESAREAN SECTION    . CHOLECYSTECTOMY  2011  . KNEE SURGERY Right   . SPINE SURGERY     Herniated Disc  . tummy tuck  2001    Current Outpatient Medications  Medication Sig Dispense Refill  . atorvastatin (LIPITOR) 40 MG tablet Take 40 mg by mouth daily.    Marland Kitchen lisinopril (PRINIVIL,ZESTRIL) 40 MG tablet Take 40 mg by mouth daily.    Marland Kitchen Ustekinumab (STELARA IV) Inject into the vein. Infusion     No current facility-administered medications for this visit.  Allergies as of 03/20/2019 - Review Complete 03/19/2019  Allergen Reaction Noted  . Humira [adalimumab] Other (See Comments) 10/05/2018  . Penicillins Hives 05/29/2014    Family History  Problem Relation Age of Onset  . Breast cancer Mother   . Esophageal cancer Father   . Leukemia Sister 5  . Breast cancer Maternal Aunt 6  . Breast cancer Maternal Grandmother        dx in her late 31s  . Heart attack Paternal Grandfather   . Cervical cancer Sister   .  Colon cancer Neg Hx     Social History   Socioeconomic History  . Marital status: Married    Spouse name: Not on file  . Number of children: 3  . Years of education: 18  . Highest education level: Not on file  Occupational History  . Occupation: Cabin crew  Social Needs  . Financial resource strain: Not on file  . Food insecurity:    Worry: Not on file    Inability: Not on file  . Transportation needs:    Medical: Not on file    Non-medical: Not on file  Tobacco Use  . Smoking status: Former Smoker    Years: 20.00  . Smokeless tobacco: Never Used  Substance and Sexual Activity  . Alcohol use: Yes    Comment: occasionally  . Drug use: No  . Sexual activity: Yes  Lifestyle  . Physical activity:    Days per week: Not on file    Minutes per session: Not on file  . Stress: Not on file  Relationships  . Social connections:    Talks on phone: Not on file    Gets together: Not on file    Attends religious service: Not on file    Active member of club or organization: Not on file    Attends meetings of clubs or organizations: Not on file    Relationship status: Not on file  . Intimate partner violence:    Fear of current or ex partner: Not on file    Emotionally abused: Not on file    Physically abused: Not on file    Forced sexual activity: Not on file  Other Topics Concern  . Not on file  Social History Narrative  . Not on file     Physical Exam: Unable to perform because this was a "telemed visit" due to current Covid-19 pandemic  Assessment and plan: 63 y.o. female with Crohn's ileocolitis  Seems that she may not be in good clinical remission currently.  She is also not very happy where she is getting her Stelara injections.  We will look into possibility of her self administering these in the future.  She will get a basic set of labs today including a CBC, complete metabolic profile, sedimentation rate and CRP.  If these suggest underlying continued inflammation  then I will consider changing her dosing regimen of the Stelara, perhaps getting it every 6 weeks instead of every 8.  If there is still confusion she understands she might need a colonoscopy to restage her Crohn's disease.  Please see the "Patient Instructions" section for addition details about the plan.  Owens Loffler, MD [facility] 03/20/2019, 10:36 AM

## 2019-03-20 NOTE — Patient Instructions (Signed)
She will come in for labs, probably tomorrow morning.  CBC, complete metabolic profile, sedimentation rate and CRP   She will start taking a single daily morning Imodium.   We will look into her self administering her Stelara injections at home, she is not very satisfied with the place where she is having them injected currently.

## 2019-03-26 ENCOUNTER — Telehealth: Payer: Self-pay | Admitting: Gastroenterology

## 2019-03-26 DIAGNOSIS — R7989 Other specified abnormal findings of blood chemistry: Secondary | ICD-10-CM

## 2019-03-26 DIAGNOSIS — R945 Abnormal results of liver function studies: Principal | ICD-10-CM

## 2019-03-26 NOTE — Telephone Encounter (Signed)
Please call her.  I reviewed her Angela [name] labs dated Mar 22, 2019.  CBC was normal.  Complete metabolic profile was normal except for AST 43 ALT 75.  Sed rate was normal CRP was normal.   Let her know the mild LFT elevation is probably nothing at all serious.  I think following the numbers with repeat LFTs in 6 weeks is very reasonable.  No sign on these labs that she has continued inflammation.  Ask how she is doing since starting the Imodium once daily.

## 2019-03-26 NOTE — Telephone Encounter (Signed)
Pt notified via My Chart and lab order in Epic

## 2019-06-24 ENCOUNTER — Other Ambulatory Visit: Payer: Self-pay

## 2019-06-24 MED ORDER — USTEKINUMAB 90 MG/ML ~~LOC~~ SOSY: 90.0000 mg | PREFILLED_SYRINGE | SUBCUTANEOUS | 0 refills | Status: DC

## 2019-06-25 ENCOUNTER — Telehealth: Payer: Self-pay | Admitting: Gastroenterology

## 2019-06-25 NOTE — Telephone Encounter (Signed)
Pt is calling about her PA .  There phone # is [phone]

## 2019-06-25 NOTE — Telephone Encounter (Signed)
[employer] called regarding PA.  Call back [id number] reference # [id number]

## 2019-06-26 ENCOUNTER — Telehealth: Payer: Self-pay | Admitting: Gastroenterology

## 2019-06-26 NOTE — Telephone Encounter (Signed)
Spoke to a representative at [facility] who states they have not received back the PA on Stelara. They re faxed over a form that has been filled out a faxed over to [place]. A message has been left on patients voicemail to inform her of this. I asked her to call back if she has any questions.

## 2019-06-26 NOTE — Telephone Encounter (Signed)
Notified patient that her Stelara 90 mg injections 1 every 8 weeks  has been approved until 06/25/2020 via [employer]

## 2019-06-28 ENCOUNTER — Telehealth: Payer: Self-pay | Admitting: Gastroenterology

## 2019-06-28 NOTE — Telephone Encounter (Signed)
The pt has rescheduled her appt to Tuesday and will come in on Monday to pick up kit for stelara antibody

## 2019-07-01 ENCOUNTER — Other Ambulatory Visit: Payer: Self-pay

## 2019-07-01 ENCOUNTER — Encounter: Payer: Self-pay | Admitting: Gastroenterology

## 2019-07-01 ENCOUNTER — Other Ambulatory Visit (INDEPENDENT_AMBULATORY_CARE_PROVIDER_SITE_OTHER): Payer: 59

## 2019-07-01 DIAGNOSIS — K50819 Crohn's disease of both small and large intestine with unspecified complications: Secondary | ICD-10-CM | POA: Diagnosis not present

## 2019-07-01 DIAGNOSIS — R7989 Other specified abnormal findings of blood chemistry: Secondary | ICD-10-CM

## 2019-07-01 DIAGNOSIS — R945 Abnormal results of liver function studies: Secondary | ICD-10-CM

## 2019-07-01 LAB — COMPREHENSIVE METABOLIC PANEL
ALT: 47 U/L — ABNORMAL HIGH (ref 0–35)
AST: 25 U/L (ref 0–37)
Albumin: 4.5 g/dL (ref 3.5–5.2)
Alkaline Phosphatase: 68 U/L (ref 39–117)
BUN: 21 mg/dL (ref 6–23)
CO2: 27 mEq/L (ref 19–32)
Calcium: 10.4 mg/dL (ref 8.4–10.5)
Chloride: 103 mEq/L (ref 96–112)
Creatinine, Ser: 1.08 mg/dL (ref 0.40–1.20)
GFR: 51.6 mL/min — ABNORMAL LOW (ref >60.00)
Glucose, Bld: 109 mg/dL — ABNORMAL HIGH (ref 70–99)
Potassium: 3.9 mEq/L (ref 3.5–5.1)
Sodium: 139 mEq/L (ref 135–145)
Total Bilirubin: 0.4 mg/dL (ref 0.2–1.2)
Total Protein: 7 g/dL (ref 6.0–8.3)

## 2019-07-01 LAB — CBC WITH DIFFERENTIAL/PLATELET
Basophils Absolute: 0.1 10*3/uL (ref 0.0–0.1)
Basophils Relative: 1.3 % (ref 0.0–3.0)
Eosinophils Absolute: 0.2 10*3/uL (ref 0.0–0.7)
Eosinophils Relative: 2.9 % (ref 0.0–5.0)
HCT: 40.9 % (ref 36.0–46.0)
Hemoglobin: 13.7 g/dL (ref 12.0–15.0)
Lymphocytes Relative: 21.2 % (ref 12.0–46.0)
Lymphs Abs: 1.4 10*3/uL (ref 0.7–4.0)
MCHC: 33.6 g/dL (ref 30.0–36.0)
MCV: 89.3 fl (ref 78.0–100.0)
Monocytes Absolute: 0.5 10*3/uL (ref 0.1–1.0)
Monocytes Relative: 7 % (ref 3.0–12.0)
Neutro Abs: 4.6 10*3/uL (ref 1.4–7.7)
Neutrophils Relative %: 67.6 % (ref 43.0–77.0)
Platelets: 259 10*3/uL (ref 150.0–400.0)
RBC: 4.58 Mil/uL (ref 3.87–5.11)
RDW: 13.3 % (ref 11.5–15.5)
WBC: 6.8 10*3/uL (ref 4.0–10.5)

## 2019-07-01 LAB — SEDIMENTATION RATE: Sed Rate: 4 mm/hr (ref 0–30)

## 2019-07-01 LAB — HEPATIC FUNCTION PANEL
ALT: 47 U/L — ABNORMAL HIGH (ref 0–35)
AST: 25 U/L (ref 0–37)
Albumin: 4.5 g/dL (ref 3.5–5.2)
Alkaline Phosphatase: 68 U/L (ref 39–117)
Bilirubin, Direct: 0.1 mg/dL (ref 0.0–0.3)
Total Bilirubin: 0.4 mg/dL (ref 0.2–1.2)
Total Protein: 7 g/dL (ref 6.0–8.3)

## 2019-07-01 LAB — HIGH SENSITIVITY CRP: CRP, High Sensitivity: 3.71 mg/L (ref 0.000–5.000)

## 2019-07-01 NOTE — Progress Notes (Signed)
The pt picked up stelara drug antibody kit from our office today and was advised to take completed paperwork, kit and ice pack to our lab now.  The pt has been advised of the information and verbalized understanding.

## 2019-07-12 ENCOUNTER — Other Ambulatory Visit: Payer: Self-pay

## 2019-07-12 MED ORDER — USTEKINUMAB 90 MG/ML ~~LOC~~ SOSY: 90.0000 mg | PREFILLED_SYRINGE | SUBCUTANEOUS | 2 refills | Status: DC

## 2019-07-23 ENCOUNTER — Ambulatory Visit (INDEPENDENT_AMBULATORY_CARE_PROVIDER_SITE_OTHER): Payer: 59 | Admitting: Gastroenterology

## 2019-07-23 ENCOUNTER — Other Ambulatory Visit: Payer: Self-pay

## 2019-07-23 ENCOUNTER — Encounter: Payer: Self-pay | Admitting: Gastroenterology

## 2019-07-23 VITALS — BP 106/68 | HR 76 | Temp 97.8°F | Ht 65.0 in | Wt 178.0 lb

## 2019-07-23 DIAGNOSIS — K50819 Crohn's disease of both small and large intestine with unspecified complications: Secondary | ICD-10-CM

## 2019-07-23 NOTE — Progress Notes (Signed)
1.Crohns ileocolitis:Diagnosed 2019 with a inflammatory process in the right lower quadrant involving the cecum and terminal ileum. Eventual colonoscopy with Dr. Dulce [name] April 2019 showed "congested, red, pseudopolypoid and scar area in the ascending colon, the proximal ascending colon and the IC valve, congested mucosa in the distal sigmoid" endoscopically this was suspicious for Angela [name] disease. Pathology showed erosion, focal active cryptitis and mild crypt or distortion. No granulomas. She had a hyperplastic polyp removed. [facility] testing was "positive for Angela [name] disease". She was evaluated by Dr. Annye [name] at [facility] surgery he recommended second opinion GI and she transferred her care to [facility]. I did feel that she had Angela [name] ileocolitis and started her on Angela [name] June 2019  Angela [name] start June 2019:Significant side effects including extreme myalgias, double vision, significant injection site pain, flulike symptoms.Changed to Angela [name] 08/2018  Angela [name] negative June 2019  Angela [name] surface antibody and antigen were -June 2019  Pneumococcal immunizations started June 2019 with Angela [name].  Angela [name] trough drug level 2.1ug/mL, no circulating antibodies (blood work August 2020); began process of every 6 week frequency dosing change.   HPI: This is a very pleasant 63 year old woman whom I last saw by telemedicine visit 2 or 3 months ago.  She has been very clear that she starts to feel poorly again week 7 and 8 of her 8-week Angela [name] regimen.  She will have crampy lower abdominal pains and a clear increase in loose stools with urgency.  No bleeding.  We were able to get her authorized for every 6-week Angela [name] injections.  Her first 6-week dose will be September 22.  Chief complaint is Angela [name] ileocolitis  ROS: complete GI ROS as described in HPI, all other review negative.  Constitutional:  No unintentional weight loss   Past Medical History:   Diagnosis Date  . Angela [name] colitis ([place])   . Family history of breast cancer   . Hyperlipidemia   . Hypertension     Past Surgical History:  Procedure Laterality Date  . CESAREAN SECTION    . CHOLECYSTECTOMY  2011  . KNEE SURGERY Right   . SPINE SURGERY     Herniated Disc  . tummy tuck  2001    Current Outpatient Medications  Medication Sig Dispense Refill  . atorvastatin (LIPITOR) 40 MG tablet Take 40 mg by mouth daily.    Marland Kitchen lisinopril (PRINIVIL,ZESTRIL) 40 MG tablet Take 40 mg by mouth daily.    . ustekinumab (Angela [name]) 90 MG/ML SOSY injection Inject 1 mL (90 mg total) into the skin every 6 (six) weeks. 3 mL 2  . Ustekinumab (Angela [name] IV) Inject into the vein. Infusion     No current facility-administered medications for this visit.     Allergies as of 07/23/2019 - Review Complete 07/23/2019  Allergen Reaction Noted  . Angela [name] [adalimumab] Other (See Comments) 10/05/2018  . Penicillins Hives 05/29/2014    Family History  Problem Relation Age of Onset  . Breast cancer Mother   . Esophageal cancer Father   . Leukemia Sister 5  . Breast cancer Maternal Aunt 53  . Breast cancer Maternal Grandmother        dx in her late 50s  . Heart attack Paternal Grandfather   . Cervical cancer Sister   . Colon cancer Neg Hx     Social History   Socioeconomic History  . Marital status: Married    Spouse name: Not on file  . Number of children: 3  . Years of education: 23  .  Highest education level: Not on file  Occupational History  . Occupation: Cabin crew  Social Needs  . Financial resource strain: Not on file  . Food insecurity    Worry: Not on file    Inability: Not on file  . Transportation needs    Medical: Not on file    Non-medical: Not on file  Tobacco Use  . Smoking status: Former Smoker    Years: 20.00  . Smokeless tobacco: Never Used  Substance and Sexual Activity  . Alcohol use: Yes    Comment: occasionally  . Drug use: No  . Sexual activity: Yes   Lifestyle  . Physical activity    Days per week: Not on file    Minutes per session: Not on file  . Stress: Not on file  Relationships  . Social Herbalist on phone: Not on file    Gets together: Not on file    Attends religious service: Not on file    Active member of club or organization: Not on file    Attends meetings of clubs or organizations: Not on file    Relationship status: Not on file  . Intimate partner violence    Fear of current or ex partner: Not on file    Emotionally abused: Not on file    Physically abused: Not on file    Forced sexual activity: Not on file  Other Topics Concern  . Not on file  Social History Narrative  . Not on file     Physical Exam: BP 106/68   Pulse 76   Temp 97.8 F (36.6 C)   Ht 5' 5"  (1.651 m)   Wt 178 lb (80.7 kg)   BMI 29.62 kg/m  Constitutional: generally well-appearing Psychiatric: alert and oriented x3 Abdomen: soft, nontender, nondistended, no obvious ascites, no peritoneal signs, normal bowel sounds No peripheral edema noted in lower extremities  Assessment and plan: 63 y.o. female with Angela [name] ileocolitis  I am very interested to see how she does with every 6-week Angela [name] dosing regimen.  She will call after her first 6-week dose which is scheduled for September 22.  I see no reason for any further blood tests or imaging studies at this point, follow-up in person in 3 months.  Please see the "Patient Instructions" section for addition details about the plan.  Angela Loffler, MD [facility] 07/23/2019, 9:57 AM

## 2019-07-23 NOTE — Patient Instructions (Signed)
Please call our office in early October to let us know how you are feeling. You can ask for [place] [place] or [place]  Return office visit with Dr Ardis Hughs in 3 months, please call us in December  Thank you for entrusting me with your care and choosing [facility].  Dr Ardis Hughs

## 2019-12-12 ENCOUNTER — Other Ambulatory Visit (INDEPENDENT_AMBULATORY_CARE_PROVIDER_SITE_OTHER): Payer: 59

## 2019-12-12 ENCOUNTER — Ambulatory Visit (INDEPENDENT_AMBULATORY_CARE_PROVIDER_SITE_OTHER): Payer: 59 | Admitting: Physician Assistant

## 2019-12-12 ENCOUNTER — Encounter: Payer: Self-pay | Admitting: Physician Assistant

## 2019-12-12 VITALS — BP 100/60 | HR 76 | Temp 97.9°F | Ht 65.0 in | Wt 179.0 lb

## 2019-12-12 DIAGNOSIS — R112 Nausea with vomiting, unspecified: Secondary | ICD-10-CM

## 2019-12-12 DIAGNOSIS — R509 Fever, unspecified: Secondary | ICD-10-CM | POA: Diagnosis not present

## 2019-12-12 DIAGNOSIS — R1011 Right upper quadrant pain: Secondary | ICD-10-CM

## 2019-12-12 DIAGNOSIS — K50819 Crohn's disease of both small and large intestine with unspecified complications: Secondary | ICD-10-CM

## 2019-12-12 LAB — CBC WITH DIFFERENTIAL/PLATELET
Basophils Absolute: 0 10*3/uL (ref 0.0–0.1)
Basophils Relative: 0.6 % (ref 0.0–3.0)
Eosinophils Absolute: 0.3 10*3/uL (ref 0.0–0.7)
Eosinophils Relative: 4.9 % (ref 0.0–5.0)
HCT: 41.2 % (ref 36.0–46.0)
Hemoglobin: 13.7 g/dL (ref 12.0–15.0)
Lymphocytes Relative: 24.2 % (ref 12.0–46.0)
Lymphs Abs: 1.3 10*3/uL (ref 0.7–4.0)
MCHC: 33.3 g/dL (ref 30.0–36.0)
MCV: 89.3 fl (ref 78.0–100.0)
Monocytes Absolute: 0.5 10*3/uL (ref 0.1–1.0)
Monocytes Relative: 9.4 % (ref 3.0–12.0)
Neutro Abs: 3.3 10*3/uL (ref 1.4–7.7)
Neutrophils Relative %: 60.9 % (ref 43.0–77.0)
Platelets: 261 10*3/uL (ref 150.0–400.0)
RBC: 4.61 Mil/uL (ref 3.87–5.11)
RDW: 12.7 % (ref 11.5–15.5)
WBC: 5.4 10*3/uL (ref 4.0–10.5)

## 2019-12-12 LAB — COMPREHENSIVE METABOLIC PANEL
ALT: 59 U/L — ABNORMAL HIGH (ref 0–35)
AST: 35 U/L (ref 0–37)
Albumin: 4.4 g/dL (ref 3.5–5.2)
Alkaline Phosphatase: 72 U/L (ref 39–117)
BUN: 20 mg/dL (ref 6–23)
CO2: 29 mEq/L (ref 19–32)
Calcium: 9.4 mg/dL (ref 8.4–10.5)
Chloride: 101 mEq/L (ref 96–112)
Creatinine, Ser: 1.06 mg/dL (ref 0.40–1.20)
GFR: 52.64 mL/min — ABNORMAL LOW (ref >60.00)
Glucose, Bld: 148 mg/dL — ABNORMAL HIGH (ref 70–99)
Potassium: 4.2 mEq/L (ref 3.5–5.1)
Sodium: 137 mEq/L (ref 135–145)
Total Bilirubin: 0.5 mg/dL (ref 0.2–1.2)
Total Protein: 7.2 g/dL (ref 6.0–8.3)

## 2019-12-12 LAB — SEDIMENTATION RATE: Sed Rate: 24 mm/hr (ref 0–30)

## 2019-12-12 LAB — HIGH SENSITIVITY CRP: CRP, High Sensitivity: 30.49 mg/L — ABNORMAL HIGH (ref 0.000–5.000)

## 2019-12-12 NOTE — Patient Instructions (Signed)
You have been scheduled for a CT scan of the abdomen and pelvis at [facility] - Radiology 1st floor     You are scheduled on 12/16/2019 at 7:30am. You should arrive 15 minutes prior to your appointment time for registration. Please follow the written instructions below on the day of your exam:  WARNING: IF YOU ARE ALLERGIC TO IODINE/X-RAY DYE, PLEASE NOTIFY RADIOLOGY IMMEDIATELY AT [phone]! YOU WILL BE GIVEN A 13 HOUR PREMEDICATION PREP.  1) Do not eat or drink anything after 3:30am (4 hours prior to your test) 2) You have been given 2 bottles of oral contrast to drink. The solution may taste better if refrigerated, but do NOT add ice or any other liquid to this solution. Shake well before drinking.    Drink 1 bottle of contrast @ 5:30am (2 hours prior to your exam)  Drink 1 bottle of contrast @ 6:30am (1 hour prior to your exam)  You may take any medications as prescribed with a small amount of water, if necessary. If you take any of the following medications: METFORMIN, GLUCOPHAGE, GLUCOVANCE, AVANDAMET, RIOMET, FORTAMET, [place] MET, JANUMET, GLUMETZA or METAGLIP, you MAY be asked to HOLD this medication 48 hours AFTER the exam.  The purpose of you drinking the oral contrast is to aid in the visualization of your intestinal tract. The contrast solution may cause some diarrhea. Depending on your individual set of symptoms, you may also receive an intravenous injection of x-ray contrast/dye. Plan on being at [facility] for 30 minutes or longer, depending on the type of exam you are having performed.  This test typically takes 30-45 minutes to complete.  If you have any questions regarding your exam or if you need to reschedule, you may call the CT department at [phone] between the hours of 8:00 am and 5:00 pm, Monday-Friday.  _______________________________________________________  Your provider has requested that you go to the basement level for lab work before leaving today. Press "B" on  the elevator. The lab is located at the first door on the left as you exit the elevator.  Thank you for choosing me and [facility].  Amy Esterwood-PA

## 2019-12-12 NOTE — Progress Notes (Signed)
Subjective:    Patient ID: Angela [name], female    DOB: 10-12-1956, 64 y.o.   MRN: [id number]  HPI Angela [name] is a pleasant 64 year old white female known to Dr. Ardis Hughs with history of Crohn's ileocolitis diagnosed in 2019.  She was initially treated with Humira but did not tolerate due to side effects.  She has been on Stelara over the past 14 months.  She had some breakthrough symptoms with q. 8-week dosing and has been on every 6 week dosing over the past several months. She said she had been doing well without any active symptoms until December 23 when she had what she describes as a flareup.  She says she developed abdominal cramping and spasm and discomfort throughout her abdomen and had a temp of around 100 for about 3 days.  She did not have any diarrhea, appetite was poor but no nausea or vomiting.  Symptoms subsided and she felt okay until this past weekend when she had another episode.  She says she felt so bad that she spent the day in bed on Monday of this week with abdominal cramping spasm and discomfort.  She had nausea and vomiting associated with this episode and a temp max of 101.  She did have some mild diarrhea.  Over the past couple days she has been feeling a little bit better, has still had some low-grade temp, not over 100.  Says she feels generally fatigued, no diarrhea , stool has been mucousy, no bowel movement today.  She has been able to eat over the past couple of days but has been eating very light. She is due for her next Stelara injection on Tuesday, 12/17/2019.  Review of Systems.Pertinent positive and negative review of systems were noted in the above HPI section.  All other review of systems was otherwise negative.  Outpatient Encounter Medications as of 12/12/2019  Medication Sig  . atorvastatin (LIPITOR) 40 MG tablet Take 40 mg by mouth daily.  Marland Kitchen lisinopril (PRINIVIL,ZESTRIL) 40 MG tablet Take 40 mg by mouth daily.  Marland Kitchen Ustekinumab (STELARA IV) Inject into the vein.  Infusion  . ustekinumab (STELARA) 90 MG/ML SOSY injection Inject 1 mL (90 mg total) into the skin every 6 (six) weeks.   No facility-administered encounter medications on file as of 12/12/2019.   Allergies  Allergen Reactions  . Humira [Adalimumab] Other (See Comments)  . Penicillins Hives    Has patient had a PCN reaction causing immediate rash, facial/tongue/throat swelling, SOB or lightheadedness with hypotension: Yes Has patient had a PCN reaction causing severe rash involving mucus membranes or skin necrosis: No Has patient had a PCN reaction that required hospitalization: No Has patient had a PCN reaction occurring within the last 10 years: No If all of the above answers are "NO", then may proceed with Cephalosporin use.    Patient Active Problem List   Diagnosis Date Noted  . Crohn's disease of small and large intestines with complication ([place]) 41/32/4401  . Intra-abdominal phelgmon (RLQ) 03/08/2018  . Hypertension 03/08/2018  . Hyperlipidemia 03/08/2018  . Acute hypokalemia 03/08/2018  . Mild renal insufficiency 03/08/2018  . Anemia 03/08/2018  . Fever 02/06/2018  . Genetic testing 11/23/2017  . Family history of breast cancer    Social History   Socioeconomic History  . Marital status: Married    Spouse name: Not on file  . Number of children: 3  . Years of education: 20  . Highest education level: Not on file  Occupational History  .  Occupation: realtor  Tobacco Use  . Smoking status: Former Smoker    Years: 20.00  . Smokeless tobacco: Never Used  Substance and Sexual Activity  . Alcohol use: Yes    Comment: occasionally  . Drug use: No  . Sexual activity: Yes  Other Topics Concern  . Not on file  Social History Narrative  . Not on file   Social Determinants of Health   Financial Resource Strain:   . Difficulty of Paying Living Expenses: Not on file  Food Insecurity:   . Worried About Charity fundraiser in the Last Year: Not on file  . Ran Out of  Food in the Last Year: Not on file  Transportation Needs:   . Lack of Transportation (Medical): Not on file  . Lack of Transportation (Non-Medical): Not on file  Physical Activity:   . Days of Exercise per Week: Not on file  . Minutes of Exercise per Session: Not on file  Stress:   . Feeling of Stress : Not on file  Social Connections:   . Frequency of Communication with Friends and Family: Not on file  . Frequency of Social Gatherings with Friends and Family: Not on file  . Attends Religious Services: Not on file  . Active Member of Clubs or Organizations: Not on file  . Attends Archivist Meetings: Not on file  . Marital Status: Not on file  Intimate Partner Violence:   . Fear of Current or Ex-Partner: Not on file  . Emotionally Abused: Not on file  . Physically Abused: Not on file  . Sexually Abused: Not on file    Ms. Housewright's family history includes Breast cancer in her maternal grandmother and mother; Breast cancer (age of onset: 64) in her maternal aunt; Cervical cancer in her sister; Esophageal cancer in her father; Heart attack in her paternal grandfather; Leukemia (age of onset: 84) in her sister.      Objective:    Vitals:   12/12/19 1427  BP: 100/60  Pulse: 76  Temp: 97.9 F (36.6 C)    Physical Exam Well-developed well-nourished older white female in no acute distress.   Weight, 179 BMI 29.7  HEENT; nontraumatic normocephalic, EOMI, PER R LA, sclera anicteric. Oropharynx; not examined/mask/Covid Neck; supple, no JVD Cardiovascular; regular rate and rhythm with S1-S2, no murmur rub or gallop Pulmonary; Clear bilaterally Abdomen; soft, bowel sounds hyperactive, she is mildly distended, she is tender in the right lower quadrant and right mid abdomen no guarding or rebound, no palpable mass or hepatosplenomegaly,  Rectal; not done today Skin; benign exam, no jaundice rash or appreciable lesions Extremities; no clubbing cyanosis or edema skin warm and  dry Neuro/Psych; alert and oriented x4, grossly nonfocal mood and affect appropriate       Assessment & Plan:  #76 64 year old white female with Crohn's ileocolitis, diagnosed 2019.  Patient has been stable over the past several months on Stelara every 6 weeks. Now presenting with 2 recent episodes of abdominal pain cramping spasm, low-grade fever which lasted for about 3 days around Christmas.  Patient had a recurrence this past weekend with 3-day history of abdominal pain cramping temp to 101 and a few episodes of nausea and vomiting.  No significant diarrhea, stools mucoid. She has tenderness in the right lower and right mid abdomen on exam.  I am concerned that she is having an exacerbation of ileitis, rule out episodic low-grade partial obstruction.  Rule out abscess.  #2 hypertension #3.  Mild renal insufficiency  Plan; full liquid to very low residue diet CBC, c-Met, sed rate, CRP.  Will check Ustekinumab trough level and antibody level today as next dose of Stelara due in a few days. Patient has been scheduled for CT scan of the abdomen and pelvis with contrast.  We were unable to get this scheduled for tomorrow so scheduled for Monday, 12/16/2019. Further recommendations pending CT and labs.  Acsa Estey S Ruthie Berch PA-C 12/12/2019   Cc: Rikki Spearing P, DO

## 2019-12-13 DIAGNOSIS — R1011 Right upper quadrant pain: Secondary | ICD-10-CM

## 2019-12-13 DIAGNOSIS — R112 Nausea with vomiting, unspecified: Secondary | ICD-10-CM

## 2019-12-13 MED ORDER — HYOSCYAMINE SULFATE 0.125 MG SL SUBL: SUBLINGUAL_TABLET | SUBLINGUAL | 3 refills | Status: DC

## 2019-12-13 NOTE — Telephone Encounter (Signed)
Called and spoke with patient-patient informed of MD recommendations; patient is agreeable with plan of care and verified pharmacy listed in chart; RX sent to [facility] per patient request; Patient verbalized understanding of information/instructions;  Patient was advised to call the office at [phone] if questions/concerns arise;

## 2019-12-13 NOTE — Progress Notes (Signed)
I agree with the above note, plan 

## 2019-12-16 ENCOUNTER — Ambulatory Visit (HOSPITAL_COMMUNITY)
Admission: RE | Admit: 2019-12-16 | Discharge: 2019-12-16 | Disposition: A | Discharge status: Home / Self Care | Payer: 59 | Source: Ambulatory Visit | Attending: Gastroenterology | Admitting: Gastroenterology

## 2019-12-16 ENCOUNTER — Encounter (HOSPITAL_COMMUNITY): Payer: Self-pay

## 2019-12-16 ENCOUNTER — Other Ambulatory Visit: Payer: Self-pay

## 2019-12-16 DIAGNOSIS — R1011 Right upper quadrant pain: Secondary | ICD-10-CM | POA: Diagnosis present

## 2019-12-16 DIAGNOSIS — K50819 Crohn's disease of both small and large intestine with unspecified complications: Secondary | ICD-10-CM | POA: Diagnosis present

## 2019-12-16 DIAGNOSIS — R112 Nausea with vomiting, unspecified: Secondary | ICD-10-CM | POA: Diagnosis present

## 2019-12-16 DIAGNOSIS — R509 Fever, unspecified: Secondary | ICD-10-CM | POA: Diagnosis present

## 2019-12-16 MED ORDER — IOHEXOL 300 MG/ML  SOLN
100.0000 mL | Freq: Once | INTRAMUSCULAR | Status: AC | PRN
  Administered 2019-12-16: 100 mL via INTRAVENOUS

## 2019-12-16 MED ORDER — SODIUM CHLORIDE (PF) 0.9 % IJ SOLN
INTRAMUSCULAR | Status: AC
  Filled 2019-12-16: qty 50

## 2019-12-17 NOTE — Telephone Encounter (Signed)
Informed patient to go ahead with Angela [name] per Dr. Ardis Hughs. Also he has not be able to review her CT yet as he is in clinic today. Patient voiced understanding.

## 2019-12-19 ENCOUNTER — Ambulatory Visit (INDEPENDENT_AMBULATORY_CARE_PROVIDER_SITE_OTHER): Payer: 59 | Admitting: Gastroenterology

## 2019-12-19 ENCOUNTER — Encounter: Payer: Self-pay | Admitting: Gastroenterology

## 2019-12-19 VITALS — BP 110/74 | HR 60 | Temp 98.5°F | Ht 65.0 in | Wt 180.0 lb

## 2019-12-19 DIAGNOSIS — Z01818 Encounter for other preprocedural examination: Secondary | ICD-10-CM | POA: Diagnosis not present

## 2019-12-19 DIAGNOSIS — K50819 Crohn's disease of both small and large intestine with unspecified complications: Secondary | ICD-10-CM | POA: Diagnosis not present

## 2019-12-19 MED ORDER — ONDANSETRON HCL 4 MG PO TABS: ORAL_TABLET | ORAL | 0 refills | Status: DC

## 2019-12-19 MED ORDER — SUPREP BOWEL PREP KIT 17.5-3.13-1.6 GM/177ML PO SOLN: 1.0000 | ORAL | 0 refills | Status: DC

## 2019-12-19 NOTE — Patient Instructions (Addendum)
You have been scheduled for an appointment with Dr.Chris White at [facility]. Your appointment is on 12/25/2019 at 1:45pm. Please arrive at (1:30pm)  for registration. Make certain to bring a list of current medications, including any over the counter medications or vitamins. Also bring your co-pay if you have one as well as your insurance cards. [facility] is located at [address], [address]. Should you need to reschedule your appointment, please contact them at [phone].  You have been scheduled for a colonoscopy. Please follow written instructions given to you at your visit today.  Please pick up your prep supplies at the pharmacy within the next 1-3 days. If you use inhalers (even only as needed), please bring them with you on the day of your procedure.   If you are age 62 or younger, your body mass index should be between 19-25. Your Body mass index is 29.95 kg/m. If this is out of the aformentioned range listed, please consider follow up with your Primary Care Provider.   You will need a 2 month follow-up with Dr. Ardis Hughs. We will contact you at a later time to schedule appointment.   We have sent the following medications to your pharmacy for you to pick up at your convenience: Suprep, Zofran   Thank you for choosing me and [facility].  Dr. Ardis Hughs

## 2019-12-19 NOTE — Progress Notes (Signed)
1.Crohns ileocolitis:Diagnosed 2019 with a inflammatory process in the right lower quadrant involving the cecum and terminal ileum. Eventual colonoscopy with Dr. Dulce Sellar April 2019 showed "congested, red, pseudopolypoid and scar area in the ascending colon, the proximal ascending colon and the IC valve, congested mucosa in the distal sigmoid" endoscopically this was suspicious for Crohn's disease. Pathology showed erosion, focal active cryptitis and mild crypt or distortion. No granulomas. She had a hyperplastic polyp removed. [facility] IBD testing was "positive for Crohn's disease". She was evaluated by Dr. Annye English at [facility] surgery he recommended second opinion GI and she transferred her care to [facility]. I did feel that she had Crohn's ileocolitis and started her on Humira June 2019  Humira start June 2019:Significant side effects including extreme myalgias, double vision, significant injection site pain, flulike symptoms.Changed to stelara 08/2018  TB quant gold negative June 2019  Hepatitis B surface antibody and antigen were -June 2019  Pneumococcal immunizations started June 2019 with Prevnar13.  Stelara trough drug level 2.1ug/mL, no circulating antibodies (blood work August 2020); began process of every 6 week frequency dosing change.  Started every 6 weeks Stelara October 2020   HPI: This is a very pleasant 64 year old woman whom I last saw here in the office was 5 months ago.  At that time we are transitioning her to more frequent Stelara, every 6 weeks based on low trough and clinical symptoms of breakthrough Crohn's pain at week 6 and 7 of her 8-week cycle.  She tells me that since she made that change to every 6-week Stelara she has been doing very very well.  Her bowels have been normal, she has had really no pains until about 1 month ago now.  Since late December she has had intermittent severe spasms in her abdomen, growling, gassiness, distended  abdomen.  She has had vomiting on couple of these occasions even.  She did have a temperature to 101.0 on a single occasion.  We repeated a CT scan and her blood work last week.  Her inflammatory markers were mixed.  Her CT scan showed persistent Crohn's related finding in the right lower quadrant.  I reviewed the images personally and also discussed them with the reading radiologist just today.  Overall the inflammation in her right lower quadrant is much improved from 1 to 2 years ago.  There is still significantly thickened terminal ileum and proximal colon.   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  There is some slightly dilated small bowel above the thickened distal ileum.   The stricture does not appear very long to my review, maybe 1 to 2 cm or so.   ROS: complete GI ROS as described in HPI, all other review negative.  Constitutional:  No unintentional weight loss   Past Medical History:  Diagnosis Date  . Crohn's colitis ([place])   . Family history of breast cancer   . Hyperlipidemia   . Hypertension     Past Surgical History:  Procedure Laterality Date  . CESAREAN SECTION    . CHOLECYSTECTOMY  2011  . KNEE SURGERY Right   . SPINE SURGERY     Herniated Disc  . tummy tuck  2001    Current Outpatient Medications  Medication Sig Dispense Refill  . atorvastatin (LIPITOR) 40 MG tablet Take 40 mg by mouth daily.    Marland Kitchen lisinopril (PRINIVIL,ZESTRIL) 40 MG tablet Take 40 mg by mouth daily.    . ustekinumab (STELARA) 90 MG/ML SOSY  injection Inject 1 mL (90 mg total) into the skin every 6 (six) weeks. 3 mL 2   No current facility-administered medications for this visit.    Allergies as of 12/19/2019 - Review Complete 12/19/2019  Allergen Reaction Noted  . Humira [adalimumab] Other (See Comments) 10/05/2018  . Penicillins Hives 05/29/2014    Family History  Problem Relation Age of Onset  . Breast cancer Mother   . Esophageal cancer Father    . Leukemia Sister 5  . Breast cancer Maternal Aunt 20  . Breast cancer Maternal Grandmother        dx in her late 36s  . Heart attack Paternal Grandfather   . Cervical cancer Sister   . Colon cancer Neg Hx     Social History   Socioeconomic History  . Marital status: Married    Spouse name: Not on file  . Number of children: 3  . Years of education: 7  . Highest education level: Not on file  Occupational History  . Occupation: realtor  Tobacco Use  . Smoking status: Former Smoker    Years: 20.00  . Smokeless tobacco: Never Used  Substance and Sexual Activity  . Alcohol use: Yes    Comment: occasionally  . Drug use: No  . Sexual activity: Yes  Other Topics Concern  . Not on file  Social History Narrative  . Not on file   Social Determinants of Health   Financial Resource Strain:   . Difficulty of Paying Living Expenses: Not on file  Food Insecurity:   . Worried About Charity fundraiser in the Last Year: Not on file  . Ran Out of Food in the Last Year: Not on file  Transportation Needs:   . Lack of Transportation (Medical): Not on file  . Lack of Transportation (Non-Medical): Not on file  Physical Activity:   . Days of Exercise per Week: Not on file  . Minutes of Exercise per Session: Not on file  Stress:   . Feeling of Stress : Not on file  Social Connections:   . Frequency of Communication with Friends and Family: Not on file  . Frequency of Social Gatherings with Friends and Family: Not on file  . Attends Religious Services: Not on file  . Active Member of Clubs or Organizations: Not on file  . Attends Archivist Meetings: Not on file  . Marital Status: Not on file  Intimate Partner Violence:   . Fear of Current or Ex-Partner: Not on file  . Emotionally Abused: Not on file  . Physically Abused: Not on file  . Sexually Abused: Not on file     Physical Exam: BP 110/74   Pulse 60   Temp 98.5 F (36.9 C)   Ht 5' 5"  (1.651 m)   Wt 180 lb  (81.6 kg)   BMI 29.95 kg/m  Constitutional: generally well-appearing Psychiatric: alert and oriented x3 Abdomen: soft, nontender, nondistended, no obvious ascites, no peritoneal signs, normal bowel sounds No peripheral edema noted in lower extremities  Assessment and plan: 64 y.o. female with Crohn's ileocolitis  I think Stelara at every 6-week dosing has been very helpful.  Overall the inflammation in her right lower quadrant is quite improved from 2019.  I am concerned that she is developing stenosis, stricturing involving her terminal, distal ileum.  She has been having obstructive-like symptoms for the past month.  I am planning colonoscopy for her sometime in the next week or so to evaluate her  colon and if possible to dilate terminal ileal stricture if I think it is safe at the time of the procedure.  She understands there is risk of perforation and that I will be as safe as always.  I am also helping to get her back into see her surgeon Dr. Annye English who referred to me little over a year ago to discuss her situation.  She knows that if stricture dilation is not possible or leads to complications that she would possibly require surgical resection.  She will continue Stelara every 6 weeks for now and I am interested to see what her trough drug level is and antibody level is that was drawn earlier this week.   Please see the "Patient Instructions" section for addition details about the plan.  Owens Loffler, MD [facility] 12/19/2019, 9:41 AM   Total time on date of encounter was 45 minutes (this included time spent preparing to see the patient reviewing records; obtaining and/or reviewing separately obtained history; performing a medically appropriate exam and/or evaluation; counseling and educating the patient and family if present; ordering medications, tests or procedures if applicable; and documenting clinical information in the health record).

## 2019-12-23 HISTORY — PX: COLONOSCOPY: SHX174

## 2019-12-24 ENCOUNTER — Telehealth: Payer: Self-pay | Admitting: Gastroenterology

## 2019-12-24 NOTE — Telephone Encounter (Signed)
Left message on machine to call back  

## 2019-12-24 NOTE — Telephone Encounter (Signed)
Please let her know that her Stelara level was very good now, greater than 10 mcg/mL.  She does not have any detectable anti-Stelara antibodies.  That is also good news.  She should continue with plans outlined at recent office visit.  Thanks

## 2019-12-24 NOTE — Telephone Encounter (Signed)
The patient has been notified of this information and all questions answered.

## 2019-12-25 ENCOUNTER — Encounter: Payer: Self-pay | Admitting: Gastroenterology

## 2019-12-26 ENCOUNTER — Ambulatory Visit (INDEPENDENT_AMBULATORY_CARE_PROVIDER_SITE_OTHER): Payer: 59

## 2019-12-26 ENCOUNTER — Other Ambulatory Visit: Payer: Self-pay | Admitting: Gastroenterology

## 2019-12-26 DIAGNOSIS — Z1159 Encounter for screening for other viral diseases: Secondary | ICD-10-CM

## 2019-12-27 LAB — SARS CORONAVIRUS 2 (TAT 6-24 HRS): SARS Coronavirus 2: NEGATIVE

## 2019-12-30 ENCOUNTER — Other Ambulatory Visit: Payer: Self-pay

## 2019-12-30 ENCOUNTER — Encounter: Payer: Self-pay | Admitting: Gastroenterology

## 2019-12-30 ENCOUNTER — Ambulatory Visit (AMBULATORY_SURGERY_CENTER): Payer: 59 | Admitting: Gastroenterology

## 2019-12-30 VITALS — BP 102/60 | HR 76 | Temp 96.8°F | Resp 16 | Ht 65.0 in | Wt 180.0 lb

## 2019-12-30 DIAGNOSIS — K635 Polyp of colon: Secondary | ICD-10-CM | POA: Diagnosis not present

## 2019-12-30 DIAGNOSIS — K6389 Other specified diseases of intestine: Secondary | ICD-10-CM

## 2019-12-30 DIAGNOSIS — K5989 Other specified functional intestinal disorders: Secondary | ICD-10-CM | POA: Diagnosis not present

## 2019-12-30 DIAGNOSIS — K50819 Crohn's disease of both small and large intestine with unspecified complications: Secondary | ICD-10-CM | POA: Diagnosis present

## 2019-12-30 MED ORDER — SODIUM CHLORIDE 0.9 % IV SOLN: 500.0000 mL | Freq: Once | INTRAVENOUS | Status: DC

## 2019-12-30 NOTE — Progress Notes (Signed)
Vitals-SB Temp-JB  History reviewed.

## 2019-12-30 NOTE — Patient Instructions (Signed)
Multiple biopsies Await pathology results     YOU HAD AN ENDOSCOPIC PROCEDURE TODAY AT [facility]:   Refer to the procedure report that was given to you for any specific questions about what was found during the examination.  If the procedure report does not answer your questions, please call your gastroenterologist to clarify.  If you requested that your care partner not be given the details of your procedure findings, then the procedure report has been included in a sealed envelope for you to review at your convenience later.  YOU SHOULD EXPECT: Some feelings of bloating in the abdomen. Passage of more gas than usual.  Walking can help get rid of the air that was put into your GI tract during the procedure and reduce the bloating. If you had a lower endoscopy (such as a colonoscopy or flexible sigmoidoscopy) you may notice spotting of blood in your stool or on the toilet paper. If you underwent a bowel prep for your procedure, you may not have a normal bowel movement for a few days.  Please Note:  You might notice some irritation and congestion in your nose or some drainage.  This is from the oxygen used during your procedure.  There is no need for concern and it should clear up in a day or so.  SYMPTOMS TO REPORT IMMEDIATELY:   Following lower endoscopy (colonoscopy or flexible sigmoidoscopy):  Excessive amounts of blood in the stool  Significant tenderness or worsening of abdominal pains  Swelling of the abdomen that is new, acute  Fever of 100F or higher   For urgent or emergent issues, a gastroenterologist can be reached at any hour by calling [phone].   DIET:  We do recommend a small meal at first, but then you may proceed to your regular diet.  Drink plenty of fluids but you should avoid alcoholic beverages for 24 hours.  ACTIVITY:  You should plan to take it easy for the rest of today and you should NOT DRIVE or use heavy machinery until tomorrow (because  of the sedation medicines used during the test).    FOLLOW UP: Our staff will call the number listed on your records 48-72 hours following your procedure to check on you and address any questions or concerns that you may have regarding the information given to you following your procedure. If we do not reach you, we will leave a message.  We will attempt to reach you two times.  During this call, we will ask if you have developed any symptoms of COVID 19. If you develop any symptoms (ie: fever, flu-like symptoms, shortness of breath, cough etc.) before then, please call [phone].  If you test positive for Covid 19 in the 2 weeks post procedure, please call and report this information to [place].    If any biopsies were taken you will be contacted by phone or by letter within the next 1-3 weeks.  Please call us at [phone] if you have not heard about the biopsies in 3 weeks.    SIGNATURES/CONFIDENTIALITY: You and/or your care partner have signed paperwork which will be entered into your electronic medical record.  These signatures attest to the fact that that the information above on your After Visit Summary has been reviewed and is understood.  Full responsibility of the confidentiality of this discharge information lies with you and/or your care-partner.

## 2019-12-30 NOTE — Op Note (Signed)
[name] Patient Name: Angela [name] Procedure Date: 12/30/2019 10:46 AM MRN: [id number] Endoscopist: Milus Banister , MD Age: 64 Referring MD:  Date of Birth: 1956-09-08 Gender: Female Account #: [id number] Procedure:                Colonoscopy Indications:              Crohn's ileocolitis, restaging, consideration for                            TI dilation Medicines:                Monitored Anesthesia Care Procedure:                Pre-Anesthesia Assessment:                           - Prior to the procedure, a History and Physical                            was performed, and patient medications and                            allergies were reviewed. The patient's tolerance of                            previous anesthesia was also reviewed. The risks                            and benefits of the procedure and the sedation                            options and risks were discussed with the patient.                            All questions were answered, and informed consent                            was obtained. Prior Anticoagulants: The patient has                            taken no previous anticoagulant or antiplatelet                            agents. ASA Grade Assessment: II - A patient with                            mild systemic disease. After reviewing the risks                            and benefits, the patient was deemed in                            satisfactory condition to undergo the procedure.  After obtaining informed consent, the colonoscope                            was passed under direct vision. Throughout the                            procedure, the patient's blood pressure, pulse, and                            oxygen saturations were monitored continuously. The                            Colonoscope was introduced through the anus and                            advanced to the the terminal ileum. The colonoscopy                             was performed without difficulty. The patient                            tolerated the procedure well. The quality of the                            bowel preparation was good. The terminal ileum,                            ileocecal valve, appendiceal orifice, and rectum                            were photographed. Scope In: 10:59:09 AM Scope Out: 11:18:36 AM Scope Withdrawal Time: 0 hours 15 minutes 36 seconds  Total Procedure Duration: 0 hours 19 minutes 27 seconds  Findings:                 The cecum was edematous with mild inflammation.                           The terminal ileum was very edematous and stenotic                            with pinpoint lumen which I could not intubate. It                            felt somewhat woody and I did not feel that I could                            effectively dilate it. Extensive biopsies taken                            from the terminal ileum                           There was a site of pinpoint papillary frond like  mucosa just distal to the IC valve (see images).                            This may represent one end of the colo-colonic                            fistula described by CT.                           The right colon mucosa was otherwise normal,                            biopsies taken randomly.                           The left colon mucosa was normal except for a 10cm                            segment of very congested, edematous mucosa in the                            sigmoid region. Biopsies taken from normal left                            colon and also congested sigmoid segment.                           The exam was otherwise without abnormality on                            direct and retroflexion views. Complications:            No immediate complications. Estimated blood loss:                            None. Estimated Blood Loss:     Estimated blood  loss: none. Impression:               The cecum was edematous with mild inflammation.                           The terminal ileum was very edematous and stenotic                            with pinpoint lumen which I could not intubate. It                            felt somewhat woody and I did not feel that I could                            effectively dilate it. Extensive biopsies taken                            from the terminal ileum  There was a site of pinpoint papillary frond like                            mucosa just distal to the IC valve (see images).                            This may represent one end of the colo-colonic                            fistula described by CT.                           The right colon mucosa was otherwise normal,                            biopsies taken randomly.                           The left colon mucosa was normal except for a 10cm                            segment of very congested, edematous mucosa in the                            sigmoid region. Biopsies taken from normal left                            colon and also congested sigmoid segment. Recommendation:           - Patient has a contact number available for                            emergencies. The signs and symptoms of potential                            delayed complications were discussed with the                            patient. Return to normal activities tomorrow.                            Written discharge instructions were provided to the                            patient.                           - Resume previous diet.                           - Continue present medications.                           - Await pathology results. Will discuss these  findings with Dr. Dema Severin. Milus Banister, MD 12/30/2019 11:32:47 AM This report has been signed electronically.

## 2019-12-30 NOTE — Progress Notes (Signed)
Report to PACU, RN, vss, BBS= Clear.  

## 2019-12-30 NOTE — Progress Notes (Signed)
Called to room to assist during endoscopic procedure.  Patient ID and intended procedure confirmed with present staff. Received instructions for my participation in the procedure from the performing physician.  

## 2020-01-01 ENCOUNTER — Telehealth: Payer: Self-pay

## 2020-01-01 NOTE — Telephone Encounter (Signed)
Attempted to reach patient for post-procedure f/u call. No answer. Left message for her to please not hesitate to call us if she has any questions/concerns regarding her care.

## 2020-01-01 NOTE — Telephone Encounter (Signed)
Attempted to reach patient for post-procedure f/u call. No answer. Left message that we will make another attempt to reach her later today and for her to please not hesitate to call us if she has any questions/concerns regarding her care.

## 2020-01-10 ENCOUNTER — Telehealth: Payer: Self-pay

## 2020-01-10 NOTE — Telephone Encounter (Signed)
I spoke with the pt and she advised that she wants to move her appt up with [facility]. I asked her if she had called [facility] to see if there were any sooner appts and she tells me that she has not.  She is going to call them at this time and see if the appt can be made sooner and will call back with any further questions or concerns.

## 2020-01-24 ENCOUNTER — Other Ambulatory Visit (HOSPITAL_COMMUNITY): Payer: Self-pay | Admitting: Surgery

## 2020-01-24 ENCOUNTER — Other Ambulatory Visit: Payer: Self-pay | Admitting: Surgery

## 2020-01-24 DIAGNOSIS — K50918 Crohn's disease, unspecified, with other complication: Secondary | ICD-10-CM

## 2020-01-28 ENCOUNTER — Other Ambulatory Visit: Payer: Self-pay

## 2020-01-28 ENCOUNTER — Ambulatory Visit
Admission: RE | Admit: 2020-01-28 | Discharge: 2020-01-28 | Disposition: A | Discharge status: Home / Self Care | Payer: 59 | Source: Ambulatory Visit | Attending: Surgery | Admitting: Surgery

## 2020-01-28 DIAGNOSIS — K50918 Crohn's disease, unspecified, with other complication: Secondary | ICD-10-CM | POA: Diagnosis present

## 2020-01-28 MED ORDER — GADOBUTROL 1 MMOL/ML IV SOLN
8.0000 mL | Freq: Once | INTRAVENOUS | Status: AC | PRN
  Administered 2020-01-28: 8 mL via INTRAVENOUS

## 2020-02-04 ENCOUNTER — Ambulatory Visit: Payer: Self-pay | Admitting: Surgery

## 2020-02-04 NOTE — H&P (Signed)
CC: Crohn's disease - long-term follow-up  HPI: Ms. Griffith is a very pleasant 68yoF with hx of hypertension, hyperlipidemia presents to me for evaluation of Crohn's disease. her gastroenterologist is Dr. Watt Climes. She comes in today with her husband. She has a 3-4 month history of intermittent right lower quadrant abdominal pain. She was recently admitted to the hospital 03/08/18 with a 3 to 28-monthhistory of fever and intermittent right lower quadrant abdominal pain. She initially had a leukocytosis and underwent imaging. CT abdomen and pelvis demonstrated an inflammatory process in the right lower quadrant, centered within the mesentery just medial to the cecum, with wall thickening of the cecum and terminal ileum. No appendix was identified and findings were therefore concerning for chronic, ruptured appendicitis with phlegmonous change. Differential diagnosis including IBD. She improved clinically and was discharged with an empiric one-month course of antibiotics for possible perforated appendicitis. She subsequent saw GI in follow-upwere a colonoscopy was performed on 03/14/18 which demonstrated: -Diverticulosis in the sigmoid -Diminutive polyp in the proximal descending -Localized area of mildly congested, eroded, pseudopolypoid and scarred mucosa in the ascending colon, proximal ascending colon, midascending colon and at the ileocecal valve. The ileocecal valve was unable to be intubated. Biopsies were taken in this location  Pathology returned colonic mucosa with erosion, focal active cryptitis and mild crypt distortion. Negative for dysplasia. This is from the ascending colon and ileocecal valve. Sigmoid colon biopsy demonstrated erosion and focal active cryptitis. Negative for dysplasia. Overall findings are nonspecific but differential includes Crohn's disease. She subsequently had a [facility] test which demonstrated a pattern consistent with IBD, specifically Crohn's disease  She was  started on mesalamine and hyoscyamine. She reports a 20lb wt loss, unintentional, since January 2019. She hasn't noted dramatic improvement in her symptoms 1 or 2 times a week will have crampy abdominal discomfort and bloating. Today, she reports doing well. Her last "flare" was last Thursday when she had crampy abdominal pain bloating and nausea. Dr. MWatt Climessaw her at that time and ordered a repeat CT A/P 04/05/18 which showed stable right lower quadrant inflammatory process without significant change. Increased small amount of pelvic free fluid. No evidence of abscess. Partial or low-grade small bowel obstruction with transition point in the distal ileum. This subsequent resolved on its own within a day. Today, she denies any fever/chills/nausea/vomiting. She is having daily bowel movements which seem to be loose but she denies any blood in her stool  [place]. She reports intermittent crampy pains in her right lower quadrant. She also has some bloating particularly later in the day. She has some fatigue that she attributes as well to her Crohn's disease. That said, she has had an approximate 40 pound weight gain. She is now having intermittent flares of pain and fevers despite ongoing Stelara therapy. She is having regular bowel movements. She has occasional nausea associated with this; denies emesis. She had a standard CT abdomen and pelvis 12/16/2019 which showed circumferential wall thickening in the terminal ileum and cecum with mild dilation of the distal ileum proximal to this level. This all suggested presence of an inflammatory stricture. Bandlike focus of soft tissue along the right colon suggesting colocolonic fistula and the tip of the cecum to the medial wall the ascending colon. The right lower quadrant abscess. She has not been on steroids.  INTERVAL HX Dr. JArdis Hughstook her for colonoscopy 12/30/19 with plan attempted  endoscopic dilation. Cecum  was found to be edematous with mild inflammation. Terminal ileum was very edematous and stenotic with pinpoint lumen which he could not intubate. He noted the quality of the tissue to be woody and he did not feel he could effectively dilated. Extensive biopsies were taken from the terminal ileum. Possible fistula lumen also visualized just distal to the ileocecal valve. Right colon mucosa was otherwise normal, biopsies taken. Left colon mucosa normal, except for a tensor segment of congested edematous mucosa in the sigmoid. Biopsies were taken from the left colon and congested sigmoid segment. Biopsies returned of the terminal ileum with colonic type mucosa with hyperemia. No active inflammation. Random colon biopsies from the right colon showed no inflammation or change. Biopsies from the left colon showed no changes or inflammation. Sigmoid biopsies showed mild superficial hyperplastic changes without active inflammation. No dysplasia on biopsies either.  Dr. Ardis Hughs and I have discussed her case as well. He feels given her symptoms and endoscopic findings that she is now in need of surgery. Looking at her CT scan and discussing with Dr. Ardis Hughs, the sigmoidal changes are likely secondary to the process going on in her ileo-colic region. This increased the potential for needing some sort of surgery to address the sigmoid as well versus being able to separate it were divided with a stapler and not do a formal sigmoid resection. Continues to have intermittent fevers associated with RLQ discomfort/aches and bloating. Denies n/v. Has maintained weight. Due to Stelara this coming Monday.   PMH: HTN, HLD  PSH: Lap chole (2007, Dr. Bubba Camp)  FHx: Denies FHx of malignancy  Social: Denies use of tobacco/EtOH/drugs. She works as a Forensic psychologist  ROS: A comprehensive 10 system review of systems was completed with the patient and pertinent findings as noted  above.  The patient is a 64 year old female.   Allergies (Tanisha A. Owens Shark, [place]; 01/24/2020 11:18 AM) Penicillins  Allergies Reconciled   Medication History (Tanisha A. Owens Shark, [place]; 01/24/2020 11:18 AM) Ustekinumab (45MG/0.5ML Solution, Subcutaneous) Active. Atorvastatin Calcium (40MG Tablet, Oral) Active. Medications Reconciled    Review of Systems [place] Gave M. Jean Skow MD; 01/24/2020 11:48 AM) General Not Present- Chills and Fever. Skin Not Present- Dryness and Excessive Sweating. HEENT Not Present- Blurred Vision, Color Blindness and Headache. Neck Not Present- Neck Pain and Neck Swelling. Respiratory Not Present- Chronic Cough and Decreased Exercise Tolerance. Cardiovascular Not Present- Chest Pain and Shortness of Breath. Gastrointestinal Present- Abdominal Pain and Bloating. Not Present- Diarrhea, Nausea, Rectal Bleeding and Vomiting. Neurological Not Present- Decreased Memory and Difficulty Speaking. Psychiatric Not Present- Anxiety and Depression. Endocrine Present- Appetite Changes. Not Present- Cold Intolerance and Excessive Hunger. Hematology Not Present- Abnormal Bleeding and Blood Thinners.  Vitals (Tanisha A. Brown RMA; 01/24/2020 11:19 AM) 01/24/2020 11:18 AM Weight: 183.2 lb Height: 65in Body Surface Area: 1.91 m Body Mass Index: 30.49 kg/m  Temp.: 64F  Pulse: 65 (Regular)  BP: 126/78 (Sitting, Left Arm, Standard)       Physical Exam [place] Gave M. Vince Ainsley MD; 01/24/2020 11:48 AM) The physical exam findings are as follows: Note:Constitutional: No acute distress; conversant; no deformities; wearing mask Eyes: Moist conjunctiva; no lid lag; anicteric sclerae; pupils equal and round Neck: Trachea midline; no palpable thyromegaly Lungs: Normal respiratory effort; no tactile fremitus CV: rrr; no palpable thrill; no pitting edema GI: Abdomen soft, nontender, nondistended; no palpable hepatosplenomegaly; no rebound/guarding MSK: Normal gait; no  clubbing/cyanosis Psychiatric: Appropriate affect; alert and oriented 3 Lymphatic: No palpable cervical or axillary lymphadenopathy  Assessment & Plan [place] Gave M. Dontavian Marchi MD; 01/24/2020 11:53 AM) Ermalene Searing DISEASE (K50.90) Story: Ms. Blankenburg is a very pleasant 80yoF with HTN, HLD here today for evaluation of what appears most likely to be ileocolic Crohn's disease with stricture. Clinically not obstructed.  -We had another long discussion today about everything -We will plan MR-enterography for preop planning -The anatomy and physiology of the GI tract was discussed at length with the patient. The pathophysiology of Crohn's disease was discussed at length as well. Impression: -We discussed all options going forward. Dr. Ardis Hughs and I feel that at this point, her symptoms are significant enough despite multiple attempts at medical treatment that this would be the time to intervene. She certainly will continue to have issues and ongoing fevers are likely due to this phlegmonous process in her right lower quadrant. I spent time reviewing her imaging again today and endoscopic findings with Dr. Ardis Hughs. The sigmoidal changes are likely secondary but increased the potential for [place] need to address the sigmoid at the same time. -We discussed laparoscopic, possibly open, ileo-cecectomy, possible sigmoidectomy, possible flexible sigmoidoscopy. Timing week of 4/12 or 4/19 -The planned procedure, material risks (including, but not limited to, pain, bleeding, infection, scarring, need for blood transfusion, damage to surrounding structures- blood vessels/nerves/viscus/organs, damage to ureter, urine leak, leak from anastomosis, need for additional procedures, worsening of pre-existing medical conditions, need for stoma which may be permanent, hernia, recurrence, DVT/PE, pneumonia, heart attack, stroke, death) benefits and alternatives to surgery were discussed at length. I noted a good probability that the procedure  would help improve her symptoms. The patient's questions were answered to her satisfaction, she voiced understanding and elected to proceed with surgery. Additionally, we discussed typical postoperative expectations and the recovery process. -Additionally, we discussed this would not cure her Crohn's but address the active problem and risks of long-term disease, recurrence and need for further surgery as well and restarting Stelara after she recovers will be important. She expressed understanding.  This patient encounter took 46 minutes today to perform the following: take history, perform exam, review outside records, interpret imaging, counsel the patient on their diagnosis and document encounter, findings & plan in the EHR  Signed by Ileana Roup, MD (01/24/2020 11:53 AM)

## 2020-02-26 ENCOUNTER — Encounter (HOSPITAL_COMMUNITY): Payer: Self-pay

## 2020-02-26 NOTE — Patient Instructions (Addendum)
DUE TO COVID-19 ONLY TWO VISITORS ARE ALLOWED TO COME WITH YOU AND STAY IN THE WAITING ROOM ONLY DURING PRE OP AND PROCEDURE. THE TWO VISITORS MAY VISIT WITH YOU IN YOUR PRIVATE ROOM DURING VISITING HOURS ONLY!!   COVID SWAB TESTING MUST BE COMPLETED ON: Saturday, February 29, 2020 at 11:05 AM   [address], [place] [facility] (Must self quarantine after testing. Follow instructions on handout.)             Your procedure is scheduled on: Wednesday, March 04, 2020   Report to [facility] Main  Entrance    Report to admitting at 6:30 AM   Call this number if you have problems the morning of surgery [phone]   Drink plenty of fluids the day of prep before surgery to prevent dehydration   Dulcolax:  Take the day before surgery   Miralax: Mix with 64 oz Gatorade/Powerade. Drink gradually over the next few hours (8 oz glass every 15-30 minutes) until gone the day prior to surgery.    Neomycin and Metronidazole:  At 2 pm, 3 pm and 10 pm after Miralax bowel prep the day prior to surgery.   Drink 2 Ensure drinks the night before surgery.    Do not eat food:After Midnight.   May have liquids until 5:30 AM day of surgery   CLEAR LIQUID DIET  Foods Allowed                                                                     Foods Excluded  Water, Black Coffee and tea, regular and decaf                             liquids that you cannot  Plain Jell-O in any flavor  (No red)                                           see through such as: Fruit ices (not with fruit pulp)                                     milk, soups, orange juice  Iced Popsicles (No red)                                    All solid food Carbonated beverages, regular and diet                                    Apple juices Sports drinks like Gatorade (No red) Lightly seasoned clear broth or consume(fat free) Sugar, honey syrup  Sample  Menu Breakfast                                Lunch  Supper Cranberry juice                    Beef broth                            Chicken broth Jell-O                                     Grape juice                           Apple juice Coffee or tea                        Jell-O                                      Popsicle                                                Coffee or tea                        Coffee or tea   Complete one Ensure drink the morning of surgery at 5:30AM the day of surgery.   Oral Hygiene is also important to reduce your risk of infection.                                    Remember - BRUSH YOUR TEETH THE MORNING OF SURGERY WITH YOUR REGULAR TOOTHPASTE   Do NOT smoke after Midnight   Take these medicines the morning of surgery with A SIP OF WATER: Atorvastatin                               You may not have any metal on your body including hair pins, jewelry, and body piercings             Do not wear make-up, lotions, powders, perfumes/cologne, or deodorant             Do not wear nail polish.  Do not shave  48 hours prior to surgery.                Do not bring valuables to the hospital. [place].   Contacts, dentures or bridgework may not be worn into surgery.   Bring small overnight bag day of surgery.    Special Instructions: Bring a copy of your healthcare power of attorney and living will documents         the day of surgery if you haven't scanned them in before.              Please read over the following fact sheets you were given:  [facility] - Preparing for Surgery Before surgery, you can play an important role.  Because skin is not sterile, your  skin needs to be as free of germs as possible.  You can reduce the number of germs on your skin by washing with CHG (chlorahexidine gluconate) soap before surgery.  CHG is an antiseptic cleaner which kills germs and  bonds with the skin to continue killing germs even after washing. Please DO NOT use if you have an allergy to CHG or antibacterial soaps.  If your skin becomes reddened/irritated stop using the CHG and inform your nurse when you arrive at Short Stay. Do not shave (including legs and underarms) for at least 48 hours prior to the first CHG shower.  You may shave your face/neck.  Please follow these instructions carefully:  1.  Shower with CHG Soap the night before surgery and the  morning of surgery.  2.  If you choose to wash your hair, wash your hair first as usual with your normal  shampoo.  3.  After you shampoo, rinse your hair and body thoroughly to remove the shampoo.                             4.  Use CHG as you would any other liquid soap.  You can apply chg directly to the skin and wash.  Gently with a scrungie or clean washcloth.  5.  Apply the CHG Soap to your body ONLY FROM THE NECK DOWN.   Do   not use on face/ open                           Wound or open sores. Avoid contact with eyes, ears mouth and   genitals (private parts).                       Wash face,  Genitals (private parts) with your normal soap.             6.  Wash thoroughly, paying special attention to the area where your    surgery  will be performed.  7.  Thoroughly rinse your body with warm water from the neck down.  8.  DO NOT shower/wash with your normal soap after using and rinsing off the CHG Soap.                9.  Pat yourself dry with a clean towel.            10.  Wear clean pajamas.            11.  Place clean sheets on your bed the night of your first shower and do not  sleep with pets. Day of Surgery : Do not apply any lotions/deodorants the morning of surgery.  Please wear clean clothes to the hospital/surgery center.  FAILURE TO FOLLOW THESE INSTRUCTIONS MAY RESULT IN THE CANCELLATION OF YOUR SURGERY  PATIENT SIGNATURE_________________________________  NURSE  SIGNATURE__________________________________  ________________________________________________________________________   Adam Phenix  An incentive spirometer is a tool that can help keep your lungs clear and active. This tool measures how well you are filling your lungs with each breath. Taking long deep breaths may help reverse or decrease the chance of developing breathing (pulmonary) problems (especially infection) following:  A long period of time when you are unable to move or be active. BEFORE THE PROCEDURE   If the spirometer includes an indicator to show your best effort, your nurse or respiratory therapist will set it to  a desired goal.  If possible, sit up straight or lean slightly forward. Try not to slouch.  Hold the incentive spirometer in an upright position. INSTRUCTIONS FOR USE  1. Sit on the edge of your bed if possible, or sit up as far as you can in bed or on a chair. 2. Hold the incentive spirometer in an upright position. 3. Breathe out normally. 4. Place the mouthpiece in your mouth and seal your lips tightly around it. 5. Breathe in slowly and as deeply as possible, raising the piston or the ball toward the top of the column. 6. Hold your breath for 3-5 seconds or for as long as possible. Allow the piston or ball to fall to the bottom of the column. 7. Remove the mouthpiece from your mouth and breathe out normally. 8. Rest for a few seconds and repeat Steps 1 through 7 at least 10 times every 1-2 hours when you are awake. Take your time and take a few normal breaths between deep breaths. 9. The spirometer may include an indicator to show your best effort. Use the indicator as a goal to work toward during each repetition. 10. After each set of 10 deep breaths, practice coughing to be sure your lungs are clear. If you have an incision (the cut made at the time of surgery), support your incision when coughing by placing a pillow or rolled up towels firmly  against it. Once you are able to get out of bed, walk around indoors and cough well. You may stop using the incentive spirometer when instructed by your caregiver.  RISKS AND COMPLICATIONS  Take your time so you do not get dizzy or light-headed.  If you are in pain, you may need to take or ask for pain medication before doing incentive spirometry. It is harder to take a deep breath if you are having pain. AFTER USE  Rest and breathe slowly and easily.  It can be helpful to keep track of a log of your progress. Your caregiver can provide you with a simple table to help with this. If you are using the spirometer at home, follow these instructions: [place] IF:   You are having difficultly using the spirometer.  You have trouble using the spirometer as often as instructed.  Your pain medication is not giving enough relief while using the spirometer.  You develop fever of 100.5 F (38.1 C) or higher. SEEK IMMEDIATE MEDICAL CARE IF:   You cough up bloody sputum that had not been present before.  You develop fever of 102 F (38.9 C) or greater.  You develop worsening pain at or near the incision site. MAKE SURE YOU:   Understand these instructions.  Will watch your condition.  Will get help right away if you are not doing well or get worse. Document Released: 03/20/2007 Document Revised: 01/30/2012 Document Reviewed: 05/21/2007 Exit[employer] Patient Information 2014 [employer], [place].   ________________________________________________________________________  WHAT IS A BLOOD TRANSFUSION? Blood Transfusion Information  A transfusion is the replacement of blood or some of its parts. Blood is made up of multiple cells which provide different functions.  Red blood cells carry oxygen and are used for blood loss replacement.  White blood cells fight against infection.  Platelets control bleeding.  Plasma helps clot blood.  Other blood products are available for  specialized needs, such as hemophilia or other clotting disorders. BEFORE THE TRANSFUSION  Who gives blood for transfusions?   Healthy volunteers who are fully evaluated to make  sure their blood is safe. This is blood bank blood. Transfusion therapy is the safest it has ever been in the practice of medicine. Before blood is taken from a donor, a complete history is taken to make sure that person has no history of diseases nor engages in risky social behavior (examples are intravenous drug use or sexual activity with multiple partners). The donor's travel history is screened to minimize risk of transmitting infections, such as malaria. The donated blood is tested for signs of infectious diseases, such as HIV and hepatitis. The blood is then tested to be sure it is compatible with you in order to minimize the chance of a transfusion reaction. If you or a relative donates blood, this is often done in anticipation of surgery and is not appropriate for emergency situations. It takes many days to process the donated blood. RISKS AND COMPLICATIONS Although transfusion therapy is very safe and saves many lives, the main dangers of transfusion include:   Getting an infectious disease.  Developing a transfusion reaction. This is an allergic reaction to something in the blood you were given. Every precaution is taken to prevent this. The decision to have a blood transfusion has been considered carefully by your caregiver before blood is given. Blood is not given unless the benefits outweigh the risks. AFTER THE TRANSFUSION  Right after receiving a blood transfusion, you will usually feel much better and more energetic. This is especially true if your red blood cells have gotten low (anemic). The transfusion raises the level of the red blood cells which carry oxygen, and this usually causes an energy increase.  The nurse administering the transfusion will monitor you carefully for complications. HOME CARE  INSTRUCTIONS  No special instructions are needed after a transfusion. You may find your energy is better. Speak with your caregiver about any limitations on activity for underlying diseases you may have. SEEK MEDICAL CARE IF:   Your condition is not improving after your transfusion.  You develop redness or irritation at the intravenous (IV) site. SEEK IMMEDIATE MEDICAL CARE IF:  Any of the following symptoms occur over the next 12 hours:  Shaking chills.  You have a temperature by mouth above 102 F (38.9 C), not controlled by medicine.  Chest, back, or muscle pain.  People around you feel you are not acting correctly or are confused.  Shortness of breath or difficulty breathing.  Dizziness and fainting.  You get a rash or develop hives.  You have a decrease in urine output.  Your urine turns a dark color or changes to pink, red, or brown. Any of the following symptoms occur over the next 10 days:  You have a temperature by mouth above 102 F (38.9 C), not controlled by medicine.  Shortness of breath.  Weakness after normal activity.  The white part of the eye turns yellow (jaundice).  You have a decrease in the amount of urine or are urinating less often.  Your urine turns a dark color or changes to pink, red, or brown. Document Released: 11/04/2000 Document Revised: 01/30/2012 Document Reviewed: 06/23/2008 [facility] Patient Information 2014 [place], [place].  _______________________________________________________________________

## 2020-02-27 ENCOUNTER — Other Ambulatory Visit: Payer: Self-pay

## 2020-02-27 ENCOUNTER — Encounter (HOSPITAL_COMMUNITY): Payer: Self-pay

## 2020-02-27 ENCOUNTER — Encounter (HOSPITAL_COMMUNITY)
Admission: RE | Admit: 2020-02-27 | Discharge: 2020-02-27 | Disposition: A | Discharge status: Home / Self Care | Payer: 59 | Source: Ambulatory Visit | Attending: Surgery | Admitting: Surgery

## 2020-02-27 DIAGNOSIS — Z01818 Encounter for other preprocedural examination: Secondary | ICD-10-CM | POA: Insufficient documentation

## 2020-02-27 NOTE — Progress Notes (Signed)
Patient has completed COVID 19 vaccine series PCP - [facility] Cardiologist - N/A  Chest x-ray - N/A EKG - 02/28/20 in epic Stress Test - N/A ECHO - N/A Cardiac Cath - N/A  Sleep Study - N/A CPAP - N/A  Fasting Blood Sugar - N/A Checks Blood Sugar __N/A___ times a day  Blood Thinner Instructions: N/A Aspirin Instructions: N/A Last Dose: N/A  Anesthesia review: N/A  Patient denies shortness of breath, fever, cough and chest pain at PAT appointment   Patient verbalized understanding of instructions that were given to them at the PAT appointment. Patient was also instructed that they will need to review over the PAT instructions again at home before surgery.

## 2020-02-28 ENCOUNTER — Encounter (HOSPITAL_COMMUNITY)
Admission: RE | Admit: 2020-02-28 | Discharge: 2020-02-28 | Disposition: A | Discharge status: Home / Self Care | Payer: 59 | Source: Ambulatory Visit | Attending: Surgery | Admitting: Surgery

## 2020-02-28 DIAGNOSIS — Z01818 Encounter for other preprocedural examination: Secondary | ICD-10-CM | POA: Diagnosis present

## 2020-02-28 LAB — CBC WITH DIFFERENTIAL/PLATELET
Abs Immature Granulocytes: 0.03 10*3/uL (ref 0.00–0.07)
Basophils Absolute: 0.1 10*3/uL (ref 0.0–0.1)
Basophils Relative: 1 %
Eosinophils Absolute: 0.3 10*3/uL (ref 0.0–0.5)
Eosinophils Relative: 4 %
HCT: 41.4 % (ref 36.0–46.0)
Hemoglobin: 13.5 g/dL (ref 12.0–15.0)
Immature Granulocytes: 1 %
Lymphocytes Relative: 23 %
Lymphs Abs: 1.5 10*3/uL (ref 0.7–4.0)
MCH: 29.7 pg (ref 26.0–34.0)
MCHC: 32.6 g/dL (ref 30.0–36.0)
MCV: 91.2 fL (ref 80.0–100.0)
Monocytes Absolute: 0.5 10*3/uL (ref 0.1–1.0)
Monocytes Relative: 8 %
Neutro Abs: 4.1 10*3/uL (ref 1.7–7.7)
Neutrophils Relative %: 63 %
Platelets: 252 10*3/uL (ref 150–400)
RBC: 4.54 MIL/uL (ref 3.87–5.11)
RDW: 13 % (ref 11.5–15.5)
WBC: 6.6 10*3/uL (ref 4.0–10.5)
nRBC: 0 % (ref 0.0–0.2)

## 2020-02-28 LAB — ABO/RH: ABO/RH(D): O POS

## 2020-02-28 LAB — PROTIME-INR
INR: 0.9 (ref 0.8–1.2)
Prothrombin Time: 12.5 seconds (ref 11.4–15.2)

## 2020-02-28 LAB — APTT: aPTT: 31 seconds (ref 24–36)

## 2020-02-28 LAB — COMPREHENSIVE METABOLIC PANEL
ALT: 67 U/L — ABNORMAL HIGH (ref 0–44)
AST: 39 U/L (ref 15–41)
Albumin: 4.4 g/dL (ref 3.5–5.0)
Alkaline Phosphatase: 66 U/L (ref 38–126)
Anion gap: 9 (ref 5–15)
BUN: 19 mg/dL (ref 8–23)
CO2: 24 mmol/L (ref 22–32)
Calcium: 9.4 mg/dL (ref 8.9–10.3)
Chloride: 106 mmol/L (ref 98–111)
Creatinine, Ser: 1.04 mg/dL — ABNORMAL HIGH (ref 0.44–1.00)
GFR calc Af Amer: >60 mL/min (ref >60)
GFR calc non Af Amer: 58 mL/min — ABNORMAL LOW (ref >60)
Glucose, Bld: 98 mg/dL (ref 70–99)
Potassium: 4.8 mmol/L (ref 3.5–5.1)
Sodium: 139 mmol/L (ref 135–145)
Total Bilirubin: 0.8 mg/dL (ref 0.3–1.2)
Total Protein: 7.2 g/dL (ref 6.5–8.1)

## 2020-02-28 NOTE — Consult Note (Signed)
[place] Nurse ostomy consult note  [place] Nurse requested for preoperative stoma site marking by Dr. Lovena Neighbours.  Discussed surgical procedure and stoma creation with patient.  Explained role of the [place] nurse team.  Patient is hopeful that no stoma is created.  Examined patient sitting and standing in order to place the marking in the patient's visual field, away from any creases or abdominal contour issues and within the rectus muscle.     Marked for colostomy in the LUQ  6.5cm to the left of the umbilicus and 3cm above the umbilicus.  Patient's abdomen cleansed with CHG wipes at site marking, allowed to air dry prior to marking.Covered mark with thin film transparent dressing to preserve mark until date of surgery, Wednesday, April 14.   [facility] Nurse team will follow up with patient after surgery for continue ostomy care and teaching should a stoma be created intraoperatively.  Thanks, Maudie Flakes, MSN, RN, [place], Arther Abbott  Pager# [phone]

## 2020-02-29 ENCOUNTER — Other Ambulatory Visit (HOSPITAL_COMMUNITY)
Admission: RE | Admit: 2020-02-29 | Discharge: 2020-02-29 | Disposition: A | Discharge status: Home / Self Care | Payer: 59 | Source: Ambulatory Visit | Attending: Surgery | Admitting: Surgery

## 2020-02-29 DIAGNOSIS — Z01812 Encounter for preprocedural laboratory examination: Secondary | ICD-10-CM | POA: Diagnosis present

## 2020-02-29 DIAGNOSIS — Z20822 Contact with and (suspected) exposure to covid-19: Secondary | ICD-10-CM | POA: Diagnosis not present

## 2020-02-29 LAB — SARS CORONAVIRUS 2 (TAT 6-24 HRS): SARS Coronavirus 2: NEGATIVE

## 2020-02-29 LAB — HEMOGLOBIN A1C
Hgb A1c MFr Bld: 6.7 % — ABNORMAL HIGH (ref 4.8–5.6)
Mean Plasma Glucose: 146 mg/dL

## 2020-03-03 ENCOUNTER — Encounter (HOSPITAL_COMMUNITY): Payer: Self-pay | Admitting: Surgery

## 2020-03-03 MED ORDER — BUPIVACAINE LIPOSOME 1.3 % IJ SUSP
20.0000 mL | Freq: Once | INTRAMUSCULAR | Status: DC
  Filled 2020-03-03: qty 20

## 2020-03-03 NOTE — Anesthesia Preprocedure Evaluation (Addendum)
Anesthesia Evaluation  Patient identified by MRN, date of birth, ID band Patient awake    Reviewed: Allergy & Precautions, NPO status , Patient's Chart, lab work & pertinent test results  Airway Mallampati: II  TM Distance: >3 FB Neck ROM: Full    Dental no notable dental hx. (+) Teeth Intact   Pulmonary former smoker,    Pulmonary exam normal breath sounds clear to auscultation       Cardiovascular hypertension, Pt. on medications Normal cardiovascular exam Rhythm:Regular Rate:Normal     Neuro/Psych negative neurological ROS  negative psych ROS   GI/Hepatic Neg liver ROS, Crohn's disease   Endo/Other  Hyperlipidemia  Renal/GU Renal InsufficiencyRenal disease  negative genitourinary   Musculoskeletal negative musculoskeletal ROS (+)   Abdominal   Peds  Hematology  (+) anemia ,   Anesthesia Other Findings   Reproductive/Obstetrics                            Anesthesia Physical Anesthesia Plan  ASA: II  Anesthesia Plan: General   Post-op Pain Management:    Induction: Intravenous  PONV Risk Score and Plan: 4 or greater and Scopolamine patch - Pre-op, Ondansetron, Midazolam and Treatment may vary due to age or medical condition  Airway Management Planned: Oral ETT  Additional Equipment:   Intra-op Plan:   Post-operative Plan: Extubation in OR  Informed Consent: I have reviewed the patients History and Physical, chart, labs and discussed the procedure including the risks, benefits and alternatives for the proposed anesthesia with the patient or authorized representative who has indicated his/her understanding and acceptance.     Dental advisory given  Plan Discussed with: CRNA and Surgeon  Anesthesia Plan Comments:        Anesthesia Quick Evaluation

## 2020-03-04 ENCOUNTER — Other Ambulatory Visit: Payer: Self-pay

## 2020-03-04 ENCOUNTER — Inpatient Hospital Stay (HOSPITAL_COMMUNITY)
Admission: RE | Admit: 2020-03-04 | Discharge: 2020-03-06 | DRG: 330 | Disposition: A | Discharge status: Home / Self Care | Payer: 59 | Attending: Surgery | Admitting: Surgery

## 2020-03-04 ENCOUNTER — Encounter (HOSPITAL_COMMUNITY): Payer: Self-pay | Admitting: Surgery

## 2020-03-04 ENCOUNTER — Encounter (HOSPITAL_COMMUNITY)
Admission: RE | Disposition: A | Discharge status: Home / Self Care | Payer: Self-pay | Source: Home / Self Care | Attending: Surgery

## 2020-03-04 ENCOUNTER — Inpatient Hospital Stay (HOSPITAL_COMMUNITY): Payer: 59 | Admitting: Anesthesiology

## 2020-03-04 DIAGNOSIS — K50112 Crohn's disease of large intestine with intestinal obstruction: Secondary | ICD-10-CM | POA: Diagnosis present

## 2020-03-04 DIAGNOSIS — E785 Hyperlipidemia, unspecified: Secondary | ICD-10-CM | POA: Diagnosis present

## 2020-03-04 DIAGNOSIS — I10 Essential (primary) hypertension: Secondary | ICD-10-CM | POA: Diagnosis present

## 2020-03-04 DIAGNOSIS — Z9049 Acquired absence of other specified parts of digestive tract: Secondary | ICD-10-CM | POA: Diagnosis not present

## 2020-03-04 DIAGNOSIS — Z86010 Personal history of colonic polyps: Secondary | ICD-10-CM | POA: Diagnosis not present

## 2020-03-04 DIAGNOSIS — K660 Peritoneal adhesions (postprocedural) (postinfection): Secondary | ICD-10-CM | POA: Diagnosis present

## 2020-03-04 DIAGNOSIS — Z888 Allergy status to other drugs, medicaments and biological substances status: Secondary | ICD-10-CM

## 2020-03-04 DIAGNOSIS — K5090 Crohn's disease, unspecified, without complications: Secondary | ICD-10-CM | POA: Diagnosis present

## 2020-03-04 DIAGNOSIS — Z880 Allergy status to penicillin: Secondary | ICD-10-CM

## 2020-03-04 DIAGNOSIS — Z87891 Personal history of nicotine dependence: Secondary | ICD-10-CM

## 2020-03-04 DIAGNOSIS — K632 Fistula of intestine: Secondary | ICD-10-CM | POA: Diagnosis present

## 2020-03-04 DIAGNOSIS — Z8249 Family history of ischemic heart disease and other diseases of the circulatory system: Secondary | ICD-10-CM

## 2020-03-04 HISTORY — PX: LAPAROSCOPIC SUBTOTAL COLECTOMY: SHX5930

## 2020-03-04 HISTORY — PX: FLEXIBLE SIGMOIDOSCOPY: SHX5431

## 2020-03-04 LAB — TYPE AND SCREEN
ABO/RH(D): O POS
Antibody Screen: NEGATIVE

## 2020-03-04 SURGERY — LAPAROSCOPIC SUBTOTAL COLECTOMY
Anesthesia: General | Site: Rectum

## 2020-03-04 MED ORDER — ROCURONIUM BROMIDE 10 MG/ML (PF) SYRINGE
PREFILLED_SYRINGE | INTRAVENOUS | Status: DC | PRN
  Administered 2020-03-04: 20 mg via INTRAVENOUS
  Administered 2020-03-04: 5 mg via INTRAVENOUS
  Administered 2020-03-04: 10 mg via INTRAVENOUS
  Administered 2020-03-04: 60 mg via INTRAVENOUS

## 2020-03-04 MED ORDER — LACTATED RINGERS IV SOLN: INTRAVENOUS | Status: DC

## 2020-03-04 MED ORDER — CHLORHEXIDINE GLUCONATE CLOTH 2 % EX PADS: 6.0000 | MEDICATED_PAD | Freq: Once | CUTANEOUS | Status: DC

## 2020-03-04 MED ORDER — ROPIVACAINE HCL 7.5 MG/ML IJ SOLN: INTRAMUSCULAR | Status: DC | PRN

## 2020-03-04 MED ORDER — PROPOFOL 10 MG/ML IV BOLUS
INTRAVENOUS | Status: AC
  Filled 2020-03-04: qty 20

## 2020-03-04 MED ORDER — DEXAMETHASONE SODIUM PHOSPHATE 10 MG/ML IJ SOLN
INTRAMUSCULAR | Status: AC
  Filled 2020-03-04: qty 1

## 2020-03-04 MED ORDER — LIDOCAINE 2% (20 MG/ML) 5 ML SYRINGE
INTRAMUSCULAR | Status: AC
  Filled 2020-03-04: qty 5

## 2020-03-04 MED ORDER — DIPHENHYDRAMINE HCL 50 MG/ML IJ SOLN: 12.5000 mg | Freq: Four times a day (QID) | INTRAMUSCULAR | Status: DC | PRN

## 2020-03-04 MED ORDER — SODIUM CHLORIDE 0.9 % IV SOLN
2.0000 g | INTRAVENOUS | Status: AC
  Administered 2020-03-04: 2 g via INTRAVENOUS
  Filled 2020-03-04: qty 2

## 2020-03-04 MED ORDER — ONDANSETRON HCL 4 MG/2ML IJ SOLN: 4.0000 mg | Freq: Once | INTRAMUSCULAR | Status: DC | PRN

## 2020-03-04 MED ORDER — IBUPROFEN 400 MG PO TABS
600.0000 mg | ORAL_TABLET | Freq: Four times a day (QID) | ORAL | Status: DC | PRN
  Administered 2020-03-05 – 2020-03-06 (×3): 600 mg via ORAL
  Filled 2020-03-04 (×3): qty 1

## 2020-03-04 MED ORDER — LACTATED RINGERS IR SOLN
Status: DC | PRN
  Administered 2020-03-04: 1000 mL

## 2020-03-04 MED ORDER — KETAMINE HCL 10 MG/ML IJ SOLN
INTRAMUSCULAR | Status: DC | PRN
  Administered 2020-03-04 (×2): 20 mg via INTRAVENOUS

## 2020-03-04 MED ORDER — 0.9 % SODIUM CHLORIDE (POUR BTL) OPTIME
TOPICAL | Status: DC | PRN
  Administered 2020-03-04: 2000 mL

## 2020-03-04 MED ORDER — FENTANYL CITRATE (PF) 250 MCG/5ML IJ SOLN
INTRAMUSCULAR | Status: DC | PRN
  Administered 2020-03-04 (×3): 50 ug via INTRAVENOUS
  Administered 2020-03-04: 25 ug via INTRAVENOUS
  Administered 2020-03-04: 100 ug via INTRAVENOUS
  Administered 2020-03-04 (×3): 50 ug via INTRAVENOUS

## 2020-03-04 MED ORDER — LIDOCAINE HCL 2 % IJ SOLN
INTRAMUSCULAR | Status: AC
  Filled 2020-03-04: qty 20

## 2020-03-04 MED ORDER — HEPARIN SODIUM (PORCINE) 5000 UNIT/ML IJ SOLN
5000.0000 [IU] | Freq: Once | INTRAMUSCULAR | Status: AC
  Administered 2020-03-04: 08:00:00 5000 [IU] via SUBCUTANEOUS
  Filled 2020-03-04: qty 1

## 2020-03-04 MED ORDER — SUCCINYLCHOLINE CHLORIDE 200 MG/10ML IV SOSY
PREFILLED_SYRINGE | INTRAVENOUS | Status: AC
  Filled 2020-03-04: qty 10

## 2020-03-04 MED ORDER — TRAMADOL HCL 50 MG PO TABS
50.0000 mg | ORAL_TABLET | Freq: Four times a day (QID) | ORAL | Status: DC | PRN
  Administered 2020-03-04: 50 mg via ORAL
  Filled 2020-03-04: qty 1

## 2020-03-04 MED ORDER — SUGAMMADEX SODIUM 500 MG/5ML IV SOLN
INTRAVENOUS | Status: DC | PRN
  Administered 2020-03-04: 300 mg via INTRAVENOUS

## 2020-03-04 MED ORDER — BUPIVACAINE HCL 0.25 % IJ SOLN
INTRAMUSCULAR | Status: AC
  Filled 2020-03-04: qty 1

## 2020-03-04 MED ORDER — ACETAMINOPHEN 500 MG PO TABS
1000.0000 mg | ORAL_TABLET | ORAL | Status: AC
  Administered 2020-03-04: 1000 mg via ORAL
  Filled 2020-03-04: qty 2

## 2020-03-04 MED ORDER — ROCURONIUM BROMIDE 10 MG/ML (PF) SYRINGE
PREFILLED_SYRINGE | INTRAVENOUS | Status: AC
  Filled 2020-03-04: qty 10

## 2020-03-04 MED ORDER — MEPERIDINE HCL 50 MG/ML IJ SOLN: 6.2500 mg | INTRAMUSCULAR | Status: DC | PRN

## 2020-03-04 MED ORDER — BUPIVACAINE LIPOSOME 1.3 % IJ SUSP
INTRAMUSCULAR | Status: DC | PRN
  Administered 2020-03-04: 20 mL

## 2020-03-04 MED ORDER — BUPIVACAINE HCL (PF) 0.25 % IJ SOLN
INTRAMUSCULAR | Status: DC | PRN
  Administered 2020-03-04: 30 mL

## 2020-03-04 MED ORDER — PHENYLEPHRINE 40 MCG/ML (10ML) SYRINGE FOR IV PUSH (FOR BLOOD PRESSURE SUPPORT)
PREFILLED_SYRINGE | INTRAVENOUS | Status: AC
  Filled 2020-03-04: qty 10

## 2020-03-04 MED ORDER — ALBUMIN HUMAN 5 % IV SOLN: INTRAVENOUS | Status: DC | PRN

## 2020-03-04 MED ORDER — ENSURE SURGERY PO LIQD
237.0000 mL | Freq: Two times a day (BID) | ORAL | Status: DC
  Administered 2020-03-05 – 2020-03-06 (×2): 237 mL via ORAL
  Filled 2020-03-04 (×5): qty 237

## 2020-03-04 MED ORDER — ALUM & MAG HYDROXIDE-SIMETH 200-200-20 MG/5ML PO SUSP: 30.0000 mL | Freq: Four times a day (QID) | ORAL | Status: DC | PRN

## 2020-03-04 MED ORDER — FENTANYL CITRATE (PF) 250 MCG/5ML IJ SOLN
INTRAMUSCULAR | Status: AC
  Filled 2020-03-04: qty 5

## 2020-03-04 MED ORDER — METRONIDAZOLE 500 MG PO TABS: 1000.0000 mg | ORAL_TABLET | ORAL | Status: DC

## 2020-03-04 MED ORDER — MIDAZOLAM HCL 2 MG/2ML IJ SOLN
INTRAMUSCULAR | Status: AC
  Filled 2020-03-04: qty 2

## 2020-03-04 MED ORDER — DEXAMETHASONE SODIUM PHOSPHATE 10 MG/ML IJ SOLN
INTRAMUSCULAR | Status: DC | PRN
  Administered 2020-03-04: 5 mg via INTRAVENOUS

## 2020-03-04 MED ORDER — LIDOCAINE 2% (20 MG/ML) 5 ML SYRINGE
INTRAMUSCULAR | Status: DC | PRN
  Administered 2020-03-04: 1.5 mg/kg/h via INTRAVENOUS

## 2020-03-04 MED ORDER — PHENYLEPHRINE HCL (PRESSORS) 10 MG/ML IV SOLN
INTRAVENOUS | Status: AC
  Filled 2020-03-04: qty 1

## 2020-03-04 MED ORDER — ONDANSETRON HCL 4 MG/2ML IJ SOLN
INTRAMUSCULAR | Status: AC
  Filled 2020-03-04: qty 2

## 2020-03-04 MED ORDER — LISINOPRIL 10 MG PO TABS
10.0000 mg | ORAL_TABLET | Freq: Every day | ORAL | Status: DC
  Administered 2020-03-05 – 2020-03-06 (×2): 10 mg via ORAL
  Filled 2020-03-04 (×2): qty 1

## 2020-03-04 MED ORDER — HYDROMORPHONE HCL 1 MG/ML IJ SOLN
0.2500 mg | INTRAMUSCULAR | Status: DC | PRN
  Administered 2020-03-04: 0.5 mg via INTRAVENOUS

## 2020-03-04 MED ORDER — HYDROMORPHONE HCL 1 MG/ML IJ SOLN
0.5000 mg | INTRAMUSCULAR | Status: DC | PRN
  Administered 2020-03-04 – 2020-03-05 (×2): 0.5 mg via INTRAVENOUS
  Filled 2020-03-04 (×2): qty 0.5

## 2020-03-04 MED ORDER — ALVIMOPAN 12 MG PO CAPS
12.0000 mg | ORAL_CAPSULE | ORAL | Status: AC
  Administered 2020-03-04: 08:00:00 12 mg via ORAL
  Filled 2020-03-04: qty 1

## 2020-03-04 MED ORDER — PROPOFOL 10 MG/ML IV BOLUS
INTRAVENOUS | Status: DC | PRN
  Administered 2020-03-04: 140 mg via INTRAVENOUS

## 2020-03-04 MED ORDER — NEOMYCIN SULFATE 500 MG PO TABS: 1000.0000 mg | ORAL_TABLET | ORAL | Status: DC

## 2020-03-04 MED ORDER — DIPHENHYDRAMINE HCL 12.5 MG/5ML PO ELIX: 12.5000 mg | ORAL_SOLUTION | Freq: Four times a day (QID) | ORAL | Status: DC | PRN

## 2020-03-04 MED ORDER — PROPOFOL 500 MG/50ML IV EMUL
INTRAVENOUS | Status: AC
  Filled 2020-03-04: qty 100

## 2020-03-04 MED ORDER — ACETAMINOPHEN 500 MG PO TABS
1000.0000 mg | ORAL_TABLET | Freq: Four times a day (QID) | ORAL | Status: DC
  Administered 2020-03-04 – 2020-03-06 (×6): 1000 mg via ORAL
  Filled 2020-03-04 (×6): qty 2

## 2020-03-04 MED ORDER — LIDOCAINE 2% (20 MG/ML) 5 ML SYRINGE
INTRAMUSCULAR | Status: DC | PRN
  Administered 2020-03-04: 60 mg via INTRAVENOUS

## 2020-03-04 MED ORDER — ONDANSETRON HCL 4 MG/2ML IJ SOLN
INTRAMUSCULAR | Status: DC | PRN
  Administered 2020-03-04 (×2): 4 mg via INTRAVENOUS

## 2020-03-04 MED ORDER — LACTATED RINGERS IV SOLN: INTRAVENOUS | Status: DC | PRN

## 2020-03-04 MED ORDER — SCOPOLAMINE 1 MG/3DAYS TD PT72
1.0000 | MEDICATED_PATCH | TRANSDERMAL | Status: DC
  Administered 2020-03-04: 1.5 mg via TRANSDERMAL
  Filled 2020-03-04: qty 1

## 2020-03-04 MED ORDER — PHENYLEPHRINE HCL-NACL 10-0.9 MG/250ML-% IV SOLN
INTRAVENOUS | Status: DC | PRN
  Administered 2020-03-04: 25 ug/min via INTRAVENOUS

## 2020-03-04 MED ORDER — HYDROMORPHONE HCL 1 MG/ML IJ SOLN
INTRAMUSCULAR | Status: AC
  Filled 2020-03-04: qty 1

## 2020-03-04 MED ORDER — EPHEDRINE SULFATE-NACL 50-0.9 MG/10ML-% IV SOSY
PREFILLED_SYRINGE | INTRAVENOUS | Status: DC | PRN
  Administered 2020-03-04: 15 mg via INTRAVENOUS
  Administered 2020-03-04: 5 mg via INTRAVENOUS
  Administered 2020-03-04: 10 mg via INTRAVENOUS

## 2020-03-04 MED ORDER — BISACODYL 5 MG PO TBEC: 20.0000 mg | DELAYED_RELEASE_TABLET | Freq: Once | ORAL | Status: DC

## 2020-03-04 MED ORDER — MIDAZOLAM HCL 2 MG/2ML IJ SOLN
INTRAMUSCULAR | Status: DC | PRN
  Administered 2020-03-04: 2 mg via INTRAVENOUS

## 2020-03-04 MED ORDER — ALVIMOPAN 12 MG PO CAPS
12.0000 mg | ORAL_CAPSULE | Freq: Two times a day (BID) | ORAL | Status: DC
  Administered 2020-03-05 – 2020-03-06 (×3): 12 mg via ORAL
  Filled 2020-03-04 (×4): qty 1

## 2020-03-04 MED ORDER — KETAMINE HCL 10 MG/ML IJ SOLN
INTRAMUSCULAR | Status: AC
  Filled 2020-03-04: qty 1

## 2020-03-04 MED ORDER — ONDANSETRON HCL 4 MG PO TABS: 4.0000 mg | ORAL_TABLET | Freq: Four times a day (QID) | ORAL | Status: DC | PRN

## 2020-03-04 MED ORDER — ONDANSETRON HCL 4 MG/2ML IJ SOLN
4.0000 mg | Freq: Four times a day (QID) | INTRAMUSCULAR | Status: DC | PRN
  Administered 2020-03-04: 4 mg via INTRAVENOUS
  Filled 2020-03-04: qty 2

## 2020-03-04 MED ORDER — EPHEDRINE 5 MG/ML INJ
INTRAVENOUS | Status: AC
  Filled 2020-03-04: qty 10

## 2020-03-04 MED ORDER — ATORVASTATIN CALCIUM 40 MG PO TABS
40.0000 mg | ORAL_TABLET | Freq: Every evening | ORAL | Status: DC
  Administered 2020-03-04 – 2020-03-05 (×2): 40 mg via ORAL
  Filled 2020-03-04 (×2): qty 1

## 2020-03-04 MED ORDER — POLYETHYLENE GLYCOL 3350 17 GM/SCOOP PO POWD: 1.0000 | Freq: Once | ORAL | Status: DC

## 2020-03-04 MED ORDER — HEPARIN SODIUM (PORCINE) 5000 UNIT/ML IJ SOLN
5000.0000 [IU] | Freq: Three times a day (TID) | INTRAMUSCULAR | Status: DC
  Administered 2020-03-04 – 2020-03-06 (×5): 5000 [IU] via SUBCUTANEOUS
  Filled 2020-03-04 (×5): qty 1

## 2020-03-04 SURGICAL SUPPLY — 90 items
ADH SKN CLS APL DERMABOND .7 (GAUZE/BANDAGES/DRESSINGS)
APPLIER CLIP 5 13 M/L LIGAMAX5 (MISCELLANEOUS)
APPLIER CLIP ROT 10 11.4 M/L (STAPLE)
APR CLP MED LRG 11.4X10 (STAPLE)
APR CLP MED LRG 5 ANG JAW (MISCELLANEOUS)
BLADE EXTENDED COATED 6.5IN (ELECTRODE) IMPLANT
CABLE HIGH FREQUENCY MONO STRZ (ELECTRODE) IMPLANT
CATH MUSHROOM 28FR (CATHETERS) IMPLANT
CATH MUSHROOM 30FR (CATHETERS) IMPLANT
CELLS DAT CNTRL 66122 CELL SVR (MISCELLANEOUS) IMPLANT
CLIP APPLIE 5 13 M/L LIGAMAX5 (MISCELLANEOUS) IMPLANT
CLIP APPLIE ROT 10 11.4 M/L (STAPLE) IMPLANT
COVER WAND RF STERILE (DRAPES) IMPLANT
DECANTER SPIKE VIAL GLASS SM (MISCELLANEOUS) ×3 IMPLANT
DERMABOND ADVANCED (GAUZE/BANDAGES/DRESSINGS)
DERMABOND ADVANCED .7 DNX12 (GAUZE/BANDAGES/DRESSINGS) IMPLANT
DISSECTOR BLUNT TIP ENDO 5MM (MISCELLANEOUS) IMPLANT
DRAIN CHANNEL 19F RND (DRAIN) IMPLANT
DRAPE SURG IRRIG POUCH 19X23 (DRAPES) ×3 IMPLANT
DRSG OPSITE POSTOP 4X10 (GAUZE/BANDAGES/DRESSINGS) IMPLANT
DRSG OPSITE POSTOP 4X6 (GAUZE/BANDAGES/DRESSINGS) ×1 IMPLANT
DRSG OPSITE POSTOP 4X8 (GAUZE/BANDAGES/DRESSINGS) IMPLANT
DRSG TEGADERM 2-3/8X2-3/4 SM (GAUZE/BANDAGES/DRESSINGS) ×1 IMPLANT
ELECT REM PT RETURN 15FT ADLT (MISCELLANEOUS) ×3 IMPLANT
EVACUATOR SILICONE 100CC (DRAIN) IMPLANT
GAUZE SPONGE 2X2 8PLY STRL LF (GAUZE/BANDAGES/DRESSINGS) IMPLANT
GAUZE SPONGE 4X4 12PLY STRL (GAUZE/BANDAGES/DRESSINGS) IMPLANT
GLOVE BIO SURGEON STRL SZ7.5 (GLOVE) ×6 IMPLANT
GLOVE INDICATOR 8.0 STRL GRN (GLOVE) ×6 IMPLANT
GOWN STRL REUS W/TWL XL LVL3 (GOWN DISPOSABLE) ×12 IMPLANT
HOLDER FOLEY CATH W/STRAP (MISCELLANEOUS) ×3 IMPLANT
KIT TURNOVER KIT A (KITS) ×1 IMPLANT
LIGASURE IMPACT 36 18CM CVD LR (INSTRUMENTS) IMPLANT
NDL INSUFFLATION 14GA 120MM (NEEDLE) IMPLANT
NEEDLE INSUFFLATION 14GA 120MM (NEEDLE) IMPLANT
PACK COLON (CUSTOM PROCEDURE TRAY) ×3 IMPLANT
PAD POSITIONING PINK XL (MISCELLANEOUS) ×3 IMPLANT
PENCIL SMOKE EVACUATOR (MISCELLANEOUS) IMPLANT
PORT LAP GEL ALEXIS MED 5-9CM (MISCELLANEOUS) IMPLANT
RELOAD AUTO 90-4.8 TA90 GRN (ENDOMECHANICALS) ×3 IMPLANT
RELOAD PROXIMATE 75MM BLUE (ENDOMECHANICALS) ×6 IMPLANT
RELOAD STAPLE 75 3.8 BLU REG (ENDOMECHANICALS) IMPLANT
RELOAD STAPLE 90 GRN THCK DST (ENDOMECHANICALS) IMPLANT
RETRACTOR WND ALEXIS 18 MED (MISCELLANEOUS) IMPLANT
RETRACTOR WND ALEXIS 25 LRG (MISCELLANEOUS) IMPLANT
RTRCTR WOUND ALEXIS 18CM MED (MISCELLANEOUS)
RTRCTR WOUND ALEXIS 25CM LRG (MISCELLANEOUS) ×3
SCISSORS LAP 5X35 DISP (ENDOMECHANICALS) ×3 IMPLANT
SEALER TISSUE G2 STRG ARTC 35C (ENDOMECHANICALS) ×3 IMPLANT
SET IRRIG TUBING LAPAROSCOPIC (IRRIGATION / IRRIGATOR) ×3 IMPLANT
SET TUBE SMOKE EVAC HIGH FLOW (TUBING) ×3 IMPLANT
SLEEVE ADV FIXATION 5X100MM (TROCAR) ×6 IMPLANT
SPONGE DRAIN TRACH 4X4 STRL 2S (GAUZE/BANDAGES/DRESSINGS) IMPLANT
SPONGE GAUZE 2X2 STER 10/PKG (GAUZE/BANDAGES/DRESSINGS) ×1
STAPLER 90 3.5 STAND SLIM (STAPLE) ×3
STAPLER 90 3.5 STD SLIM (STAPLE) IMPLANT
STAPLER CUT CVD 40MM GREEN (STAPLE) ×1 IMPLANT
STAPLER GUN LINEAR PROX 60 (STAPLE) IMPLANT
STAPLER PROXIMATE 75MM BLUE (STAPLE) ×1 IMPLANT
STAPLER VISISTAT 35W (STAPLE) IMPLANT
SUT ETHILON 3 0 PS 1 (SUTURE) IMPLANT
SUT MNCRL AB 4-0 PS2 18 (SUTURE) ×2 IMPLANT
SUT PDS AB 1 CT1 27 (SUTURE) ×2 IMPLANT
SUT PDS AB 1 CTX 36 (SUTURE) IMPLANT
SUT PDS AB 1 TP1 54 (SUTURE) IMPLANT
SUT PDS AB 1 TP1 96 (SUTURE) IMPLANT
SUT PROLENE 2 0 KS (SUTURE) ×3 IMPLANT
SUT PROLENE 2 0 SH DA (SUTURE) ×3 IMPLANT
SUT SILK 2 0 (SUTURE) ×3
SUT SILK 2 0 SH CR/8 (SUTURE) ×3 IMPLANT
SUT SILK 2-0 18XBRD TIE 12 (SUTURE) ×2 IMPLANT
SUT SILK 3 0 (SUTURE) ×3
SUT SILK 3 0 SH CR/8 (SUTURE) ×3 IMPLANT
SUT SILK 3-0 18XBRD TIE 12 (SUTURE) ×2 IMPLANT
SUT VIC AB 2-0 SH 27 (SUTURE)
SUT VIC AB 2-0 SH 27X BRD (SUTURE) IMPLANT
SUT VIC AB 3-0 SH 18 (SUTURE) IMPLANT
SUT VIC AB 3-0 SH 27 (SUTURE)
SUT VIC AB 3-0 SH 27X BRD (SUTURE) IMPLANT
SUT VICRYL 2 0 18  UND BR (SUTURE) ×3
SUT VICRYL 2 0 18 UND BR (SUTURE) ×2 IMPLANT
SYS LAPSCP GELPORT 120MM (MISCELLANEOUS)
SYSTEM LAPSCP GELPORT 120MM (MISCELLANEOUS) IMPLANT
TOWEL OR 17X26 10 PK STRL BLUE (TOWEL DISPOSABLE) IMPLANT
TOWEL OR NON WOVEN STRL DISP B (DISPOSABLE) ×3 IMPLANT
TRAY FOLEY MTR SLVR 14FR STAT (SET/KITS/TRAYS/PACK) IMPLANT
TRAY FOLEY MTR SLVR 16FR STAT (SET/KITS/TRAYS/PACK) ×1 IMPLANT
TRAY IRRIG W/60CC SYR STRL (SET/KITS/TRAYS/PACK) ×3 IMPLANT
TROCAR ADV FIXATION 5X100MM (TROCAR) ×3 IMPLANT
TROCAR XCEL BLUNT TIP 100MML (ENDOMECHANICALS) IMPLANT

## 2020-03-04 NOTE — H&P (Signed)
CC: Crohn's disease - here today for surgery  HPI: Ms. Rynders is a very pleasant 18yoF with hx of HTN & HLD, whom presented to me for evaluation of Crohn's disease. She has long-standing history of intermittent right lower quadrant abdominal pain. She was admitted to the hospital 03/08/18 with a 3 to 51-monthhistory of fever and intermittent right lower quadrant abdominal pain. She initially had a leukocytosis and underwent imaging. CT abdomen and pelvis demonstrated an inflammatory process in the right lower quadrant, centered within the mesentery just medial to the cecum, with wall thickening of the cecum and terminal ileum. No appendix was identified and findings were therefore concerning for chronic, ruptured appendicitis with phlegmonous change. Differential diagnosis included IBD. She improved clinically and was discharged with an empiric one-month course of antibiotics for possible perforated appendicitis. She subsequent saw GI in follow-upwere a colonoscopy was performed on 03/14/18 which demonstrated: -Diverticulosis in the sigmoid -Diminutive polyp in the proximal descending -Localized area of mildly congested, eroded, pseudopolypoid and scarred mucosa in the ascending colon, proximal ascending colon, midascending colon and at the ileocecal valve. The ileocecal valve was unable to be intubated. Biopsies were taken in this location  Pathology returned colonic mucosa with erosion, focal active cryptitis and mild crypt distortion. Negative for dysplasia. This is from the ascending colon and ileocecal valve. Sigmoid colon biopsy demonstrated erosion and focal active cryptitis. Negative for dysplasia. Overall findings are nonspecific but differential includes Crohn's disease. She subsequently had a [facility] test which demonstrated a pattern consistent with IBD, specifically Crohn's disease  She was started on mesalamine and hyoscyamine. She reported 20lb wt loss, unintentional, since  January 2019. She did not note dramatic improvement in her symptoms 1 or 2 times a week will have crampy abdominal discomfort and bloating. Dr. MWatt Climessaw her and ordered a repeat CT A/P 04/05/18 which showed stable right lower quadrant inflammatory process without significant change. Increased small amount of pelvic free fluid. No evidence of abscess. Partial or low-grade small bowel obstruction with transition point in the distal ileum. This subsequent resolved on its own within a day.   She ended up switching GI providers and [facility] Stelara. She reported intermittent crampy pains in RLQ. She also has some bloating particularly later in the day. She has some fatigue that she attributes as well to her Crohn's disease. That said, she has had an approximate 40 pound weight gain. She however began having intermittent flares of pain and associated fevers >101.5 despite ongoing Stelara therapy. She was having regular bowel movements. She has occasional nausea associated with this; denies emesis. She had a standard CT abdomen and pelvis 12/16/2019 which showed circumferential wall thickening in the terminal ileum and cecum with mild dilation of the distal ileum proximal to this level. This all suggested presence of an inflammatory stricture. Bandlike focus of soft tissue along the right colon suggesting colocolonic fistula and the tip of the cecum to the medial wall the ascending colon. The right lower quadrant abscess. She has not been on steroids.  Dr. JArdis Hughstook her for colonoscopy 12/30/19 with plan attempted endoscopic dilation. Cecum was found to be edematous with mild inflammation. Terminal ileum was very edematous and stenotic with pinpoint lumen which he could not intubate. He noted the quality of the tissue to be woody and he did not feel he could effectively dilated. Extensive biopsies were taken from the terminal ileum. Possible fistula lumen  also visualized just  distal to the ileocecal valve. Right colon mucosa was otherwise normal, biopsies taken. Left colon mucosa normal, except for a single segment of congested edematous mucosa in the sigmoid. Biopsies were taken from the left colon and congested sigmoid segment. Biopsies returned of the terminal ileum with colonic type mucosa with hyperemia. No active inflammation. Random colon biopsies from the right colon showed no inflammation or change. Biopsies from the left colon showed no changes or inflammation. Sigmoid biopsies showed mild superficial hyperplastic changes without active inflammation. No dysplasia on biopsies either.  Dr. Ardis Hughs and I have discussed her case as well with our multidisciplinary IBD conference and over the phone. He feels given her symptoms and endoscopic findings that she is now in need of surgery. Looking at her CT scan and discussing with Dr. Ardis Hughs, the sigmoidal changes are likely secondary to the process going on in her ileo-colic region. This increased the potential for needing some sort of surgery to address the sigmoid as well versus being able to separate it were divided with a stapler and not do a formal sigmoid resection. Continues to have intermittent fevers associated with RLQ discomfort/aches and bloating. Denies n/v. Has maintained weight. Due to Stelara this coming Monday.  MRE 01/28/20 1. Active inflammatory Crohn's disease involving the distal/terminal ileum and the base of the cecum, similar to the prior, although mildly improved. 2. No associated stricture/bowel obstruction or convincing fibrostenosing component. 3. 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 No associated abscess.   PMH: HTN, HLD  PSH: Lap chole (2007, Dr. Bubba Camp)  FHx: Denies FHx of malignancy  Social: Denies use of tobacco/EtOH/drugs. She works as a Forensic psychologist  ROS: A  comprehensive 10 system review of systems was completed with the patient and pertinent findings as noted above.  Past Medical History:  Diagnosis Date  . Crohn's colitis ([place])   . Family history of breast cancer   . Hyperlipidemia   . Hypertension     Past Surgical History:  Procedure Laterality Date  . CESAREAN SECTION     x2  . CHOLECYSTECTOMY  2011  . COLONOSCOPY  12/2019  . KNEE SURGERY Right   . SPINE SURGERY     Herniated Disc  . tummy tuck  2001    Family History  Problem Relation Age of Onset  . Breast cancer Mother   . Esophageal cancer Father   . Leukemia Sister 5  . Breast cancer Maternal Aunt 46  . Breast cancer Maternal Grandmother        dx in her late 14s  . Heart attack Paternal Grandfather   . Cervical cancer Sister   . Colon cancer Neg Hx   . Liver cancer Neg Hx   . Stomach cancer Neg Hx     Social:  reports that she has quit smoking. She quit after 20.00 years of use. She has never used smokeless tobacco. She reports current alcohol use. She reports that she does not use drugs.  Allergies:  Allergies  Allergen Reactions  . Humira [Adalimumab] Other (See Comments)    Joints aches/double vision/flu-like symptoms.  . Penicillins Hives    Has patient had a PCN reaction causing immediate rash, facial/tongue/throat swelling, SOB or lightheadedness with hypotension: Yes Has patient had a PCN reaction causing severe rash involving mucus membranes or skin necrosis: No Has patient had a PCN reaction that required hospitalization: No Has patient had a PCN reaction occurring  within the last 10 years: No If all of the above answers are "NO", then may proceed with Cephalosporin use.     Medications: I have reviewed the patient's current medications.  No results found for this or any previous visit (from the past 48 hour(s)).  No results found.  ROS - all of the below systems have been reviewed with the patient and positives are indicated with bold  text General: chills, fever or night sweats Eyes: blurry vision or double vision ENT: epistaxis or sore throat Allergy/Immunology: itchy/watery eyes or nasal congestion Hematologic/Lymphatic: bleeding problems, blood clots or swollen lymph nodes Endocrine: temperature intolerance or unexpected weight changes Breast: new or changing breast lumps or nipple discharge Resp: cough, shortness of breath, or wheezing CV: chest pain or dyspnea on exertion GI: as per HPI GU: dysuria, trouble voiding, or hematuria MSK: joint pain or joint stiffness Neuro: TIA or stroke symptoms Derm: pruritus and skin lesion changes Psych: anxiety and depression  PE Blood pressure 130/82, pulse 75, temperature 98.9 F (37.2 C), temperature source Oral, resp. rate 16, height 5' 5"  (1.651 m), weight 81.6 kg, SpO2 97 %. Constitutional: NAD; conversant; no deformities Eyes: Moist conjunctiva; no lid lag; anicteric Lungs: Normal respiratory effort CV: RRR GI: Abd soft, NT/ND; stoma marker in place; no palpable hepatosplenomegaly Psychiatric: Appropriate affect; alert and oriented x3  No results found for this or any previous visit (from the past 48 hour(s)).  No results found.   A/P: Ms. Ospina is a very pleasant 32yoF with HTN, HLD here today for surgery  -The anatomy and physiology of the GI tract was discussed at length with the patient. The pathophysiology of Crohn's disease was discussed at length as well. -We discussed all options going forward. Dr. Ardis Hughs and I feel that at this point, her symptoms are significant enough despite multiple attempts at medical treatment that this would be the time to intervene. She certainly will continue to have issues and ongoing fevers are likely due to this phlegmonous process in her right lower quadrant. -We again discussed laparoscopic, possibly open, ileo-cecectomy, possible sigmoidectomy, possible flexible sigmoidoscopy. -The planned procedure, material risks  (including, but not limited to, pain, bleeding, infection, scarring, need for blood transfusion, damage to surrounding structures- blood vessels/nerves/viscus/organs, damage to ureter, urine leak, leak from anastomosis, need for additional procedures, worsening of pre-existing medical conditions, need for stoma which may be permanent, hernia, recurrence, DVT/PE, pneumonia, heart attack, stroke, death) benefits and alternatives to surgery were discussed at length. I noted a good probability that the procedure would help improve her symptoms but would not cure her of Crohn's disease - that this will be a life-long issue. The patient's questions were answered to her satisfaction, she voiced understanding and elected to proceed with surgery. Additionally, we discussed typical postoperative expectations and the recovery process.  Sharon Mt. Dema Severin, M.D. [facility] Use [email or web address] to contact on call provider

## 2020-03-04 NOTE — Op Note (Signed)
PATIENT: Angela [name]  64 y.o. female  Patient Care Team: Angela [name], [name] as PCP - General (Family Medicine)  PREOP DIAGNOSIS: Ileocolic Crohn's disease  POSTOP DIAGNOSIS: Same  PROCEDURE: 1. Laparoscopic assisted ileocolic resection 2. Lysis of adhesions x 60 minutes 3. Diagnostic flexible sigmoidoscopy (to assess site of disease)  SURGEON: Angela Mt. Edgerrin Correia, MD  ASSISTANT: Angela Ruff, MD  ANESTHESIA: General endotracheal  EBL: 150 mL Total I/O In: 2350 [I.V.:2000; IV Piggyback:350] Out: 89 [Urine:130; Blood:150]  DRAINS: None  SPECIMEN: Terminal ileum, cecum, appendix, proximal ascending colon (en bloc)  COUNTS: Sponge, needle and instrument counts were reported correct x2  FINDINGS: Adhesions between omentum, abdominal wall, cecum, ascending colon. These were carefully lysed sharply. Ultimately this phlegmon was able to be freed.  STATEMENT OF MEDICAL NECESSITY: Angela [name] is a 63 y.o. female with history of HTN, HLD and long-standing RLQ pain. She presented to the ED 02/2018 having intermittent RLQ pain and fevers for 4 months.  CT scan demonstrated an inflammatory process in the right lower quadrant medial to the cecum and associated thickening of the cecum and TI.  Appendix could not be seen and there was initial concerns about ruptured appendicitis with phlegmonous change however on careful examination of her history, is highly suggestive of IBD.  She was seen in follow-up with gastroenterology and had a colonoscopy, ultimately diagnosed with Crohn's disease and started on hyoscyamine and mesalamine.  She had persistent issues.  A Prometheus test demonstrated a pattern consistent with IBD specifically Crohn's.  She experienced 20 pound weight loss associate with all this.  She was ultimately transitioned onto Stelara where she began to have some improvement in all of her symptoms.  She did have a thick fibrotic stricture in her terminal ileum  and despite months of biologic therapy, continued to have issues with intermittent right lower quadrant pain, fever.  She did begin to experience some degree of weight gain.  Repeat colonoscopy 12/2019 demonstrated some congestion and erythema in the sigmoid but no clear disease in this location.  This was thought to be secondary to bystander effect as targeted biopsies at this location returned as normal colonic tissue without evidence of active inflammation.  She subsequently underwent MRE 01/2020 which showed wall thickening of the distal ileum/TI and associated mucosal enhancement suggesting active disease here.  Adjacent mild wall thickening involving the base of the cecum with colocolonic fistula into the ascending colon.  There is also tethering communication involving adjacent loop of sigmoid.  No abscess.  Her case was presented at our multidisciplinary IBD conference and the consensus was made to proceed with surgery given her persistent symptoms and failure to dramatically improve on biologic therapy.  Please refer to notes elsewhere for details regarding his discussion.   NARRATIVE:  The patient was identified & brought into the operating room, placed supine on the operating table and SCDs were applied to the lower extremities. General endotracheal anesthesia was induced. The patient was positioned in lithotomy with Allen stirrups with arms tucked. Pressure points were then padded and verified. Antibiotics were administered. A foley catheter was placed under sterile conditions. The abdomen was prepped and draped in a sterile fashion. A timeout was performed confirming our patient and plan.   Beginning with the extraction port, a supraumbilical incision was made and carried down to the midline fascia. This was then incised with electrocautery. The peritoneum was identified and elevated between clamps and carefully opened sharply. A small Alexis wound protector with  a cap and associated port was then  placed. The abdomen was insufflated to 15 mmHg with CO2. A laparoscope was placed and camera inspection revealed no evidence of injury. Bilateral TAP blocks were then performed under laparoscopic visualization using a mixture of 0.25% marcaine with epinepherine + Exparel. 2 additional ports were then placed under direct laparoscopic visualization - two in the left hemiabdomen and ultimately one in the right abdomen. The abdomen was surveyed. The liver and peritoneum appeared normal. She was positioned in trendelenburg with left side down. There were many adhesions in the right lower quadrant between omentum and cecum and abdominal wall. These were then carefully taken down sharply. The cecum was separated from the abdominal wall.  Adhesions between the terminal ileum and abdominal wall were taken down as well as the ascending colon and the abdominal wall.  There was a large phlegmon medial to the cecum.  Therefore a lateral to medial approach was selected to begin with.  The cecum and ascending colon were mobilized by incising the Angela [name] line of Toldt and reflecting the colon and mesocolon medially.  The hepatic flexure was approached.  Once no further mobilization was possible from this approach, she was repositioned in reverse Trendelenburg.  A window between the proximal transverse colon and omentum was identified.  This was opened sharply and a plane was developed in the lesser sac.  The hepatic flexure was then mobilized from the medial approach.  At this point, the hepatic flexure reached down to the midline.  The duodenum was identified and preserved free of injury during mobilization of the hepatic flexure mesentery.  Attachments were carefully taken down sharply such that the mesentery was completely freed from the underlying duodenum.  Attachments between the cecum and the retroperitoneum were carefully developed sharply and the retroperitoneal structures preserved.  The terminal ileum was also mobilized.   There was a large phlegmon medially that approached a portion of the sigmoid colon.  This was carefully dissected sharply, stopping 1 sigmoid serosa was identified.  The tissue here was quite thickened and woody along the TI mesentery and so as to reduce the chance of injury to the sigmoid with any further laparoscopic adhesiolysis, the decision was made to proceed with extraction and further dissection.  Pneumoperitoneum was released.  The incision for the extraction site was enlarged cephalad and caudad around the umbilicus.  The fascia was also incised.  A large Alexis wound protector was then placed.  Blue towels were placed along the field.  The phlegmon was able to be brought up into the wound protector.  The sigmoid colon was normal in appearance but there was bystander effect.  This appeared to involve more of the mesentery of the terminal ileum than it did the actual sigmoid colon.  A window was able to be developed behind all of this and the amount of tissue between it was actually only a centimeter or 2.  Given these findings, decision was made to divide this with a contoured green load stapler.  This was closed, held and then fired.  The sigmoid did not appear to be at all narrowed by doing this.  The staple line was inspected along the sigmoid and mesocolon and was intact without any separation.  At this point, the distal ileum and cecum/appendix/descending colon were eviscerated.  The proximal point of transection was selected at an area where the ileum was healthy, working to preserve as much ileum as possible.  A window was created the mesentery and  the ileum divided using a GIA blue load stapler.  The distal point of transection was identified on the mid ascending colon.  Omental adhesions were carefully taken off of the descending colon.  The ascending colon this level was normal in appearance without any apparent disease.  Everything in between he did have creeping fat and had changes  consistent with Crohn's disease.  A GIA blue load stapler was then used to divide the mid ascending colon.  The intervening mesentery was then divided using the Enseal device.  A couple oozing areas in the mesentery were controlled with 2-0 silk suture.  The colon was hugged and dividing the mesentery so as not to devascularize the ascending colon.  There was a large apparent right colic vessel that clearly went up to and had a palpable pulse within the associated mesocolon and went up to the place where the colon had been divided.  The colon was healthy in appearance and pink.  The ileum was pink.  The specimen was passed off.  Attention was then turned to creating the ileocolic anastomosis.  The ileum was carefully inspected and orientation had been maintained throughout by placing a Babcock clamp on the proximal ileal staple line.  A stay suture was placed and an enterotomy and colotomy were created.  A GIA blue load 75 mm stapler was then passed, closed, held, fired.  The staple line was inspected noted to be intact and hemostatic.  The common enterotomy was then closed with a TA blue load stapler.  The corners of the TA staple line were then "dunked" using 2-0 silk suture.  A "crotch" suture was placed using 2-0 silk suture at the apex of the anastomosis.  The anastomosis was palpated and noted to be widely patent, 3 fingerbreadths.  Omentum was then brought down over our staple lines.  Attention was turned back to the sigmoid colon.  The staple lines were inspected and noted to be complete and healthy in appearance.  The associated sigmoid colon was normal in appearance.  Proximal to this, a bowel clamp was carefully placed.  I then went below and passed the flexible sigmoidoscope to further evaluate the area.  Under direct visualization, the scope was introduced to the anus and the rectum was normal in appearance.  The sigmoid colon was normal in appearance.  The area of congestion was seen but did not  appear diseased and there was no evident granulation tissue.  This was widely patent and easily traversable with the scope.  There is no apparent narrowing.  The pelvis had been filled with sterile saline at this point and with the staple line under water, there was no extravasation of air bubbles.  CO2 was and evacuated from the rectum.  I then scrubbed back in.  Irrigation was evacuated from the pelvis.  The abdomen is reinspected and noted to be hemostatic.  Given all these findings, attention was turned to closure.  All equipment was exchanged for clean equipment, gowns, gloves.  All dirty equipment then passed off including the laparoscopic ports.  Additional sterile drapes were placed around the field.  The midline fascia was then closed using 2 running #1 PDS sutures.  All sponge, needle and instrument counts were reported correct x2. The fascia was palpated and noted to be completely closed.  The skin of all incision sites was closed with staples.  Dressings consisting of honeycomb over the midline and 2 x 2/Tegaderms over the port sites were placed.  She was then  taken out of lithotomy, awakened from anesthesia, extubated, transferred to a stretcher for transport to PACU in satisfactory condition.  DISPOSITION: PACU in satisfactory condition

## 2020-03-04 NOTE — Anesthesia Postprocedure Evaluation (Signed)
Anesthesia Post Note  Patient: Angela [name]  Procedure(s) Performed: LAPAROSCOPIC ILEOCOLECTOMY, LYSIS OF ADHESIONS, BILATERAL TAP BLOCK (N/A Abdomen) DIAGNOSTIC FLEXIBLE SIGMOIDOSCOPY (Rectum)     Patient location during evaluation: PACU Anesthesia Type: General Level of consciousness: awake and alert and oriented Pain management: pain level controlled Vital Signs Assessment: post-procedure vital signs reviewed and stable Respiratory status: spontaneous breathing, nonlabored ventilation and respiratory function stable Cardiovascular status: blood pressure returned to baseline and stable Postop Assessment: no apparent nausea or vomiting Anesthetic complications: no                 Elyjah Hazan A.

## 2020-03-04 NOTE — Anesthesia Procedure Notes (Signed)
Procedure Name: Intubation Date/Time: 03/04/2020 8:32 AM Performed by: Cynda Familia, CRNA Pre-anesthesia Checklist: Patient identified, Emergency Drugs available, Suction available and Patient being monitored Patient Re-evaluated:Patient Re-evaluated prior to induction Oxygen Delivery Method: Circle System Utilized Preoxygenation: Pre-oxygenation with 100% oxygen Induction Type: IV induction Ventilation: Mask ventilation without difficulty Laryngoscope Size: Miller and 2 Grade View: Grade I Tube type: Oral Number of attempts: 1 Airway Equipment and Method: Stylet Placement Confirmation: ETT inserted through vocal cords under direct vision,  positive ETCO2 and breath sounds checked- equal and bilateral Secured at: 22 cm Tube secured with: Tape Dental Injury: Teeth and Oropharynx as per pre-operative assessment  Comments: Smooth IV induction AM CRNA-- intubation AM CRNA  Atraumatic-- front teeth with irregular surfaces-- unchanged with laryngoscopy-- bilat BS Royce Macadamia

## 2020-03-04 NOTE — Consult Note (Signed)
WOC follow-up:  Pt did not receive an ostomy during surgery today.  Please re-consult if further assistance is needed.  Thank-you,  Julien Girt MSN, [place], [place], [place], [place]

## 2020-03-04 NOTE — Anesthesia Procedure Notes (Signed)
Date/Time: 03/04/2020 11:36 AM Performed by: Cynda Familia, CRNA Oxygen Delivery Method: Simple face mask Placement Confirmation: positive ETCO2 and breath sounds checked- equal and bilateral Dental Injury: Teeth and Oropharynx as per pre-operative assessment

## 2020-03-04 NOTE — Transfer of Care (Signed)
Immediate Anesthesia Transfer of Care Note  Patient: Angela [name]  Procedure(s) Performed: LAPAROSCOPIC ILEOCOLECTOMY, LYSIS OF ADHESIONS, BILATERAL TAP BLOCK (N/A Abdomen) DIAGNOSTIC FLEXIBLE SIGMOIDOSCOPY (Rectum)  Patient Location: PACU  Anesthesia Type:General  Level of Consciousness: awake and alert   Airway & Oxygen Therapy: Patient Spontanous Breathing and Patient connected to face mask oxygen  Post-op Assessment: Report given to RN and Post -op Vital signs reviewed and stable  Post vital signs: Reviewed and stable  Last Vitals:  Vitals Value Taken Time  BP 157/83 03/04/20 1145  Temp 36.5 C 03/04/20 1145  Pulse 69 03/04/20 1150  Resp 15 03/04/20 1150  SpO2 100 % 03/04/20 1150  Vitals shown include unvalidated device data.  Last Pain:  Vitals:   03/04/20 0748  TempSrc:   PainSc: 0-No pain         Complications: No apparent anesthesia complications

## 2020-03-05 LAB — CBC
HCT: 37.8 % (ref 36.0–46.0)
Hemoglobin: 12.4 g/dL (ref 12.0–15.0)
MCH: 30.2 pg (ref 26.0–34.0)
MCHC: 32.8 g/dL (ref 30.0–36.0)
MCV: 92 fL (ref 80.0–100.0)
Platelets: 238 10*3/uL (ref 150–400)
RBC: 4.11 MIL/uL (ref 3.87–5.11)
RDW: 13 % (ref 11.5–15.5)
WBC: 9.6 10*3/uL (ref 4.0–10.5)
nRBC: 0 % (ref 0.0–0.2)

## 2020-03-05 LAB — BASIC METABOLIC PANEL
Anion gap: 9 (ref 5–15)
BUN: 13 mg/dL (ref 8–23)
CO2: 25 mmol/L (ref 22–32)
Calcium: 8.5 mg/dL — ABNORMAL LOW (ref 8.9–10.3)
Chloride: 105 mmol/L (ref 98–111)
Creatinine, Ser: 0.97 mg/dL (ref 0.44–1.00)
GFR calc Af Amer: >60 mL/min (ref >60)
GFR calc non Af Amer: >60 mL/min (ref >60)
Glucose, Bld: 132 mg/dL — ABNORMAL HIGH (ref 70–99)
Potassium: 4.5 mmol/L (ref 3.5–5.1)
Sodium: 139 mmol/L (ref 135–145)

## 2020-03-05 LAB — GLUCOSE, CAPILLARY
Glucose-Capillary: 114 mg/dL — ABNORMAL HIGH (ref 70–99)
Glucose-Capillary: 145 mg/dL — ABNORMAL HIGH (ref 70–99)

## 2020-03-05 MED ORDER — INSULIN ASPART 100 UNIT/ML ~~LOC~~ SOLN: 0.0000 [IU] | Freq: Three times a day (TID) | SUBCUTANEOUS | Status: DC

## 2020-03-05 MED ORDER — INSULIN ASPART 100 UNIT/ML ~~LOC~~ SOLN: 0.0000 [IU] | Freq: Every day | SUBCUTANEOUS | Status: DC

## 2020-03-05 NOTE — Progress Notes (Signed)
PT Cancellation Note  Patient Details Name: Angela [name] MRN: [id number] DOB: 11-06-1956   Cancelled Treatment:     I observed the patient ambulating down the hall with OT Independently. Pt reported she had no difficulty and did not need acute PT services. Upon visual screen I agree with Pt's conclusion and pt is d/c from acute PT services with evaluation not completed. If pt has a change in status, please reorder PT services.   Lelon Mast 03/05/2020, 11:09 AM

## 2020-03-05 NOTE — Evaluation (Signed)
Occupational Therapy Evaluation Patient Details Name: Angela [name]: [id number] DOB: 04-30-1956 Today's Date: 03/05/2020    History of Present Illness 64yo F with hx of HTN, HLD, and Crohn's disease. Now s/p Laparoscopic assisted ileocolic resection Lysis of adhesions, and Diagnostic flexible sigmoidoscopy on 03/04/20.   Clinical Impression   Pt admitted with the above diagnoses and presents with below problem list. At baseline, pt is independent with ADLs and IADLs, active. Pt presents close to baseline with ADLs. A little extra time needed to complete basic ADLs but no physical assist or external support needed at this time. Pt completed 2 laps in the halls at independent level. Educated on technique for LB ADLs for comfort. Tolerated session well. No further OT needs indicated. OT signing off.     Follow Up Recommendations  No OT follow up    Equipment Recommendations  None recommended by OT    Recommendations for Other Services       Precautions / Restrictions Precautions Precautions: None Restrictions Weight Bearing Restrictions: No      Mobility Bed Mobility Overal bed mobility: Modified Independent             General bed mobility comments: extra time  Transfers Overall transfer level: Modified independent Equipment used: None             General transfer comment: extra time than baseline, no external support needed    Balance Overall balance assessment: No apparent balance deficits (not formally assessed)                                         ADL either performed or assessed with clinical judgement   ADL Overall ADL's : Independent;Modified independent                                       General ADL Comments: Discussed strategies for LB ADLs for comfort. Pt completed community distance functional mobility (2 laps around unit) at independent level.      Vision         Perception     Praxis       Pertinent Vitals/Pain Pain Assessment: Faces Faces Pain Scale: Hurts a little bit Pain Location: "a little sore" in abdomen Pain Descriptors / Indicators: Sore Pain Intervention(s): Monitored during session     Hand Dominance     Extremity/Trunk Assessment Upper Extremity Assessment Upper Extremity Assessment: Overall WFL for tasks assessed   Lower Extremity Assessment Lower Extremity Assessment: Overall WFL for tasks assessed   Cervical / Trunk Assessment Cervical / Trunk Assessment: Normal   Communication Communication Communication: No difficulties   Cognition Arousal/Alertness: Awake/alert Behavior During Therapy: WFL for tasks assessed/performed Overall Cognitive Status: Within Functional Limits for tasks assessed                                     General Comments       Exercises     Shoulder Instructions      Home Living Family/patient expects to be discharged to:: Private residence Living Arrangements: Spouse/significant other Available Help at Discharge: Family;Available 24 hours/day Type of Home: House Home Access: Level entry     Home Layout: Two level;Able to live on main level  with bedroom/bathroom     Bathroom Shower/Tub: Walk-in shower         Home Equipment: None          Prior Functioning/Environment Level of Independence: Independent        Comments: active and independent at baseline        OT Problem List:        OT Treatment/Interventions:      OT Goals(Current goals can be found in the care plan section) Acute Rehab OT Goals Patient Stated Goal: home asap  OT Frequency:     Barriers to D/C:            Co-evaluation              AM-PAC OT "6 Clicks" Daily Activity     Outcome Measure Help from another person eating meals?: None Help from another person taking care of personal grooming?: None Help from another person toileting, which includes using toliet, bedpan, or urinal?: None Help  from another person bathing (including washing, rinsing, drying)?: None Help from another person to put on and taking off regular upper body clothing?: None Help from another person to put on and taking off regular lower body clothing?: None 6 Click Score: 24   End of Session Nurse Communication: Mobility status;Other (comment)(independent to walk in hall from OT standpoint)  Activity Tolerance: Patient tolerated treatment well Patient left: in bed;with call bell/phone within reach  OT Visit Diagnosis: Pain                Time: 6203-5597 OT Time Calculation (min): 11 min Charges:  OT General Charges $OT Visit: 1 Visit OT Evaluation $OT Eval Low Complexity: [place], OT Acute Rehabilitation Services Pager: [phone] Office: [phone]   Hortencia Pilar 03/05/2020, 10:10 AM

## 2020-03-05 NOTE — Progress Notes (Signed)
Subjective No acute events. Feeling well. Has taken clear liquids without issue. Denies n/v. Ambulating. Pain controlled with PO analgesics. No flatus/BM yet - feels like she's about to  Objective: Vital signs in last 24 hours: Temp:  [97.6 F (36.4 C)-98.7 F (37.1 C)] 98.5 F (36.9 C) (04/15 0456) Pulse Rate:  [59-78] 59 (04/15 0456) Resp:  [10-18] 17 (04/15 0456) BP: (112-163)/(71-92) 132/71 (04/15 0456) SpO2:  [91 %-100 %] 98 % (04/15 0456) Weight:  [81.4 kg] 81.4 kg (04/15 0456) Last BM Date: 03/03/20  Intake/Output from previous day: 04/14 0701 - 04/15 0700 In: 3000.3 [P.O.:100; I.V.:2550.3; IV Piggyback:350] Out: 2880 [Urine:2730; Blood:150] Intake/Output this shift: No intake/output data recorded.  Gen: NAD, comfortable CV: RRR Pulm: Normal work of breathing Abd: Soft, appropriately ttp, not significantly distended Ext: SCDs in place  Lab Results: CBC  Recent Labs    03/05/20 0440  WBC 9.6  HGB 12.4  HCT 37.8  PLT 238   BMET Recent Labs    03/05/20 0440  NA 139  K 4.5  CL 105  CO2 25  GLUCOSE 132*  BUN 13  CREATININE 0.97  CALCIUM 8.5*   PT/INR No results for input(s): LABPROT, INR in the last 72 hours. ABG No results for input(s): PHART, HCO3 in the last 72 hours.  Invalid input(s): PCO2, PO2  Studies/Results:  Anti-infectives: Anti-infectives (From admission, onward)   Start     Dose/Rate Route Frequency Ordered Stop   03/04/20 1400  neomycin (MYCIFRADIN) tablet 1,000 mg  Status:  Discontinued     1,000 mg Oral 3 times per day 03/04/20 0705 03/04/20 0706   03/04/20 1400  metroNIDAZOLE (FLAGYL) tablet 1,000 mg  Status:  Discontinued     1,000 mg Oral 3 times per day 03/04/20 0705 03/04/20 0706   03/04/20 0715  cefoTEtan (CEFOTAN) 2 g in sodium chloride 0.9 % 100 mL IVPB     2 g 200 mL/hr over 30 Minutes Intravenous On call to O.R. 03/04/20 0705 03/04/20 0849       Assessment/Plan: Patient Active Problem List   Diagnosis Date Noted   . Crohn's disease ([place]) 03/04/2020  . Crohn's disease of small and large intestines with complication ([place]) 99/35/7017  . Intra-abdominal phelgmon ([place]) 03/08/2018  . Hypertension 03/08/2018  . Hyperlipidemia 03/08/2018  . Acute hypokalemia 03/08/2018  . Mild renal insufficiency 03/08/2018  . Anemia 03/08/2018  . Fever 02/06/2018  . Genetic testing 11/23/2017  . Family history of breast cancer    s/p Procedure(s): LAPAROSCOPIC ILEOCOLECTOMY, LYSIS OF ADHESIONS, BILATERAL TAP BLOCK DIAGNOSTIC FLEXIBLE SIGMOIDOSCOPY 03/04/2020  -We have reviewed operative findings and procedure and answered related questions -Full liquids - advance to soft as tolerated -Ambulate 5x/day -HLIV -D/C foley -PPx: SQH SCDs   LOS: 1 day   Sharon Mt. Dema Severin, M.D. [facility] Use [email or web address] to contact on call provider

## 2020-03-06 ENCOUNTER — Other Ambulatory Visit: Payer: Self-pay

## 2020-03-06 LAB — CBC
HCT: 37.4 % (ref 36.0–46.0)
Hemoglobin: 11.7 g/dL — ABNORMAL LOW (ref 12.0–15.0)
MCH: 30.2 pg (ref 26.0–34.0)
MCHC: 31.3 g/dL (ref 30.0–36.0)
MCV: 96.6 fL (ref 80.0–100.0)
Platelets: 208 10*3/uL (ref 150–400)
RBC: 3.87 MIL/uL (ref 3.87–5.11)
RDW: 13.4 % (ref 11.5–15.5)
WBC: 8 10*3/uL (ref 4.0–10.5)
nRBC: 0 % (ref 0.0–0.2)

## 2020-03-06 LAB — BASIC METABOLIC PANEL
Anion gap: 7 (ref 5–15)
BUN: 19 mg/dL (ref 8–23)
CO2: 26 mmol/L (ref 22–32)
Calcium: 8.7 mg/dL — ABNORMAL LOW (ref 8.9–10.3)
Chloride: 106 mmol/L (ref 98–111)
Creatinine, Ser: 1 mg/dL (ref 0.44–1.00)
GFR calc Af Amer: >60 mL/min (ref >60)
GFR calc non Af Amer: >60 mL/min (ref >60)
Glucose, Bld: 126 mg/dL — ABNORMAL HIGH (ref 70–99)
Potassium: 3.7 mmol/L (ref 3.5–5.1)
Sodium: 139 mmol/L (ref 135–145)

## 2020-03-06 LAB — GLUCOSE, CAPILLARY: Glucose-Capillary: 115 mg/dL — ABNORMAL HIGH (ref 70–99)

## 2020-03-06 MED ORDER — TRAMADOL HCL 50 MG PO TABS: 50.0000 mg | ORAL_TABLET | Freq: Four times a day (QID) | ORAL | 0 refills | Status: AC | PRN

## 2020-03-06 NOTE — Progress Notes (Signed)
Subjective No acute events. Feeling reasonably well. Passing flatus multiple times and has had 2 BM. Denies nausea or vomiting - has been tolerating diet. Pain controlled.  Objective: Vital signs in last 24 hours: Temp:  [98.1 F (36.7 C)] 98.1 F (36.7 C) (04/16 0513) Pulse Rate:  [55-66] 65 (04/16 0803) Resp:  [18] 18 (04/16 0513) BP: (121-132)/(63-94) 126/94 (04/16 0803) SpO2:  [97 %] 97 % (04/16 0513) Weight:  [81.5 kg] 81.5 kg (04/16 0500) Last BM Date: 03/06/20  Intake/Output from previous day: 04/15 0701 - 04/16 0700 In: 480 [P.O.:480] Out: 1250 [Urine:1250] Intake/Output this shift: Total I/O In: -  Out: 1 [Stool:1]  Gen: NAD, comfortable CV: RRR Pulm: Normal work of breathing Abd: Soft, appropriately tender, not significantly distended. Incision c/d/i - ecchymoses on right, no erythema or drainage. Ext: SCDs in place  Lab Results: CBC  Recent Labs    03/05/20 0440 03/06/20 0433  WBC 9.6 8.0  HGB 12.4 11.7*  HCT 37.8 37.4  PLT 238 208   BMET Recent Labs    03/05/20 0440 03/06/20 0433  NA 139 139  K 4.5 3.7  CL 105 106  CO2 25 26  GLUCOSE 132* 126*  BUN 13 19  CREATININE 0.97 1.00  CALCIUM 8.5* 8.7*   PT/INR No results for input(s): LABPROT, INR in the last 72 hours. ABG No results for input(s): PHART, HCO3 in the last 72 hours.  Invalid input(s): PCO2, PO2  Studies/Results:  Anti-infectives: Anti-infectives (From admission, onward)   Start     Dose/Rate Route Frequency Ordered Stop   03/04/20 1400  neomycin (MYCIFRADIN) tablet 1,000 mg  Status:  Discontinued     1,000 mg Oral 3 times per day 03/04/20 0705 03/04/20 0706   03/04/20 1400  metroNIDAZOLE (FLAGYL) tablet 1,000 mg  Status:  Discontinued     1,000 mg Oral 3 times per day 03/04/20 0705 03/04/20 0706   03/04/20 0715  cefoTEtan (CEFOTAN) 2 g in sodium chloride 0.9 % 100 mL IVPB     2 g 200 mL/hr over 30 Minutes Intravenous On call to O.R. 03/04/20 0705 03/04/20 0849        Assessment/Plan: Patient Active Problem List   Diagnosis Date Noted  . Crohn's disease ([place]) 03/04/2020  . Crohn's disease of small and large intestines with complication ([place]) 16/08/9603  . Intra-abdominal phelgmon ([facility]) 03/08/2018  . Hypertension 03/08/2018  . Hyperlipidemia 03/08/2018  . Acute hypokalemia 03/08/2018  . Mild renal insufficiency 03/08/2018  . Anemia 03/08/2018  . Fever 02/06/2018  . Genetic testing 11/23/2017  . Family history of breast cancer    s/p Procedure(s): LAPAROSCOPIC ILEOCOLECTOMY, LYSIS OF ADHESIONS, BILATERAL TAP BLOCK DIAGNOSTIC FLEXIBLE SIGMOIDOSCOPY 03/04/2020  -Doing quite well -Asking to go home - has met criteria for discharge otherwise -Follow-up arranged -Reviewed postop expectations and recovery today   LOS: 2 days   Sharon Mt. Dema Severin, M.D. [facility] Use [email or web address] to contact on call provider

## 2020-03-06 NOTE — Discharge Summary (Signed)
Patient ID: Angela [name] MRN: [id number] DOB/AGE: 62-Aug-1957 62 y.o.  Admit date: 03/04/2020 Discharge date: 03/06/2020  Discharge Diagnoses Patient Active Problem List   Diagnosis Date Noted  . Crohn's disease ([place]) 03/04/2020  . Crohn's disease of small and large intestines with complication ([place]) 17/91/5056  . Intra-abdominal phelgmon ([facility]) 03/08/2018  . Hypertension 03/08/2018  . Hyperlipidemia 03/08/2018  . Acute hypokalemia 03/08/2018  . Mild renal insufficiency 03/08/2018  . Anemia 03/08/2018  . Fever 02/06/2018  . Genetic testing 11/23/2017  . Family history of breast cancer     Consultants None  PROCEDURE: 1. Laparoscopic assisted ileocolic resection 2. Lysis of adhesions x 60 minutes 3. Diagnostic flexible sigmoidoscopy (to assess site of disease)  Hospital Course: She was admitted postoperatively where she recovered appropriately. Her diet was advanced and she began having flatus and BMs by POD#2. Her pain was controlled on oral analgesics. She was tolerating a diet and ambulating well on her own. She was deemed stable for discharge home.  Allergies as of 03/06/2020      Reactions   Humira [adalimumab] Other (See Comments)   Joints aches/double vision/flu-like symptoms.   Penicillins Hives   Has patient had a PCN reaction causing immediate rash, facial/tongue/throat swelling, SOB or lightheadedness with hypotension: Yes Has patient had a PCN reaction causing severe rash involving mucus membranes or skin necrosis: No Has patient had a PCN reaction that required hospitalization: No Has patient had a PCN reaction occurring within the last 10 years: No If all of the above answers are "NO", then may proceed with Cephalosporin use.      Medication List    STOP taking these medications   metroNIDAZOLE 500 MG tablet Commonly known as: FLAGYL   neomycin 500 MG tablet Commonly known as: MYCIFRADIN     TAKE these medications   atorvastatin 40 MG tablet Commonly  known as: LIPITOR Take 40 mg by mouth daily.   lisinopril 10 MG tablet Commonly known as: ZESTRIL Take 10 mg by mouth daily.   ondansetron 4 MG tablet Commonly known as: ZOFRAN Take 1 tablet 30 min before starting preparation for colonoscopy. x2   traMADol 50 MG tablet Commonly known as: Ultram Take 1 tablet (50 mg total) by mouth every 6 (six) hours as needed for up to 5 days (severe pain not controlled with tylenol and ibuprofen).   ustekinumab 90 MG/ML Sosy injection Commonly known as: STELARA Inject 1 mL (90 mg total) into the skin every 6 (six) weeks.          Sharon Mt. Dema Severin, M.D. [facility]

## 2020-03-09 ENCOUNTER — Telehealth: Payer: Self-pay

## 2020-03-09 NOTE — Telephone Encounter (Signed)
You routed conversation to You 54 minutes ago (8:49 AM)  Milus Banister, MD  You 18 hours ago (2:59 PM)   Isabel Ardila,  Please can you help make sure that her next stellara dose is about 3 weeks from now and then ov with me 3-4 weeks after that. Thanks   Message text   You routed conversation to Milus Banister, MD 3 days ago  Geni Bers, MD 4 days ago   At my last appointment Dr. Eugenia Pancoast wanted me to make a follow up appointment in 2 months. . Since then I saw Dr Dema Severin and had surgery yesterday. Dr white said to hold off on taking the Stelara for an additional 3 more weeks. My question is should I still make the appointment or postpone it?  Thanks!

## 2020-03-09 NOTE — Telephone Encounter (Signed)
I spoke with the pt and she is all set for her Stelara.  She has a follow up appt with Dr Ardis Hughs on 6/16.  The pt has been advised of the information and verbalized understanding.

## 2020-03-10 LAB — SURGICAL PATHOLOGY

## 2020-05-06 ENCOUNTER — Other Ambulatory Visit: Payer: Self-pay

## 2020-05-06 ENCOUNTER — Encounter: Payer: Self-pay | Admitting: Gastroenterology

## 2020-05-06 ENCOUNTER — Ambulatory Visit (INDEPENDENT_AMBULATORY_CARE_PROVIDER_SITE_OTHER): Payer: 59 | Admitting: Gastroenterology

## 2020-05-06 VITALS — BP 116/74 | HR 68 | Ht 65.0 in | Wt 183.4 lb

## 2020-05-06 DIAGNOSIS — K50119 Crohn's disease of large intestine with unspecified complications: Secondary | ICD-10-CM

## 2020-05-06 MED ORDER — USTEKINUMAB 90 MG/ML ~~LOC~~ SOSY: 90.0000 mg | PREFILLED_SYRINGE | SUBCUTANEOUS | 6 refills | Status: DC

## 2020-05-06 NOTE — Patient Instructions (Signed)
If you are age 61 or older, your body mass index should be between 23-30. Your Body mass index is 30.52 kg/m. If this is out of the aforementioned range listed, please consider follow up with your Primary Care Provider.  If you are age 24 or younger, your body mass index should be between 19-25. Your Body mass index is 30.52 kg/m. If this is out of the aformentioned range listed, please consider follow up with your Primary Care Provider.   You will follow up in the office in 6 months (December 2021).  Thank you for entrusting me with your care and choosing [facility].  Dr Ardis Hughs

## 2020-05-06 NOTE — Progress Notes (Signed)
1.Crohns ileocolitis:Diagnosed 2019 with a inflammatory process in the right lower quadrant involving the cecum and terminal ileum. Eventual colonoscopy with Dr. Dulce Sellar April 2019 showed "congested, red, pseudopolypoid and scar area in the ascending colon, the proximal ascending colon and the IC valve, congested mucosa in the distal sigmoid" endoscopically this was suspicious for Crohn's disease. Pathology showed erosion, focal active cryptitis and mild crypt or distortion. No granulomas. She had a hyperplastic polyp removed. [facility] IBD testing was "positive for Crohn's disease". She was evaluated by Dr. Annye English at [facility] surgery he recommended second opinion GI and she transferred her care to [facility]. I did feel that she had Crohn's ileocolitis and started her on Humira June 2019.  Colonoscopy February 2021 Dr. Ardis Hughs TI was stenotic with a pinpoint lumen which could not be intubated..  Just distal to the IC valve there was a frond-like mucosa though suspicious for colocolonic fistula described on recent CT.  Right colon was normal-appearing, left colon was normal except for a 10 cm segment that appeared congested and edematous.  Pathology showed no active inflammation or chronic changes.  Humira start June 2019:Significant side effects including extreme myalgias, double vision, significant injection site pain, flulike symptoms.Changed to stelara 08/2018  TB quant gold negative June 2019  Hepatitis B surface antibody and antigen were -June 2019  Pneumococcal immunizations started June 2019 with Prevnar13.  Stelara trough drug level 2.1ug/mL, no circulating antibodies(blood work August 2020);began process of every 6 week frequency dosing change.  Started every 6 weeks Stelara October 2020.  Trough level January 2021 greater than 10 ug/mL  Lap assisted ileocolonic resection, lysis of adhesion April 2021 Dr. 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   HPI: This is a very pleasant 64 year old woman whom I last saw 4 months ago at the time of a colonoscopy.  See those results summarized above.  She underwent ileocecectomy April 2021.  Surgical findings above.  Since surgery she has felt great.  She has no fevers, no fatigue.  Her abdominal pains are nearly completely gone.  She does still have some minor aches after brisk exercise but that is improving daily.  Her bowels are maybe even a little better than they were prior to her surgery.  She has a bowel movement about daily.  It is usually fairly loose but not diarrhea.  Certainly not bloody.  She is still getting her Stelara every 6 weeks.  She thinks she has 2 refills left.  She is thrilled with the way she feels   ROS: complete GI ROS as described in HPI, all other review negative.  Constitutional:  No unintentional weight loss   Past Medical History:  Diagnosis Date  . Crohn's colitis ([place])   . Family history of breast cancer   . Hyperlipidemia   . Hypertension     Past Surgical History:  Procedure Laterality Date  . CESAREAN SECTION     x2  . CHOLECYSTECTOMY  2011  . COLONOSCOPY  12/2019  . FLEXIBLE SIGMOIDOSCOPY  03/04/2020   Procedure: DIAGNOSTIC FLEXIBLE SIGMOIDOSCOPY;  Surgeon: Ileana Roup, MD;  Location: [facility];  Service: General;;  . KNEE SURGERY Right   . LAPAROSCOPIC SUBTOTAL COLECTOMY N/A 03/04/2020   Procedure: LAPAROSCOPIC ILEOCOLECTOMY, LYSIS OF ADHESIONS, BILATERAL TAP BLOCK;  Surgeon: Ileana Roup, MD;  Location: [facility];  Service: General;  Laterality: N/A;  . SPINE SURGERY     Herniated Disc  . tummy tuck  2001    Current Outpatient Medications  Medication Sig Dispense Refill  . atorvastatin (LIPITOR) 40 MG tablet Take 40 mg by mouth daily.    Marland Kitchen lisinopril  (ZESTRIL) 10 MG tablet Take 10 mg by mouth daily.    . ondansetron (ZOFRAN) 4 MG tablet Take 1 tablet 30 min before starting preparation for colonoscopy. x2 (Patient not taking: Reported on 02/19/2020) 2 tablet 0  . ustekinumab (STELARA) 90 MG/ML SOSY injection Inject 1 mL (90 mg total) into the skin every 6 (six) weeks. 3 mL 2   No current facility-administered medications for this visit.    Allergies as of 05/06/2020 - Review Complete 03/04/2020  Allergen Reaction Noted  . Humira [adalimumab] Other (See Comments) 10/05/2018  . Penicillins Hives 05/29/2014    Family History  Problem Relation Age of Onset  . Breast cancer Mother   . Esophageal cancer Father   . Leukemia Sister 5  . Breast cancer Maternal Aunt 33  . Breast cancer Maternal Grandmother        dx in her late 70s  . Heart attack Paternal Grandfather   . Cervical cancer Sister   . Colon cancer Neg Hx   . Liver cancer Neg Hx   . Stomach cancer Neg Hx     Social History   Socioeconomic History  . Marital status: Married    Spouse name: Not on file  . Number of children: 3  . Years of education: 59  . Highest education level: Not on file  Occupational History  . Occupation: realtor  Tobacco Use  . Smoking status: Former Smoker    Years: 20.00  . Smokeless tobacco: Never Used  Vaping Use  . Vaping Use: Never used  Substance and Sexual Activity  . Alcohol use: Yes    Comment: occasionally  . Drug use: No  . Sexual activity: Yes  Other Topics Concern  . Not on file  Social History Narrative  . Not on file   Social Determinants of Health   Financial Resource Strain:   . Difficulty of Paying Living Expenses:   Food Insecurity:   . Worried About Charity fundraiser in the Last Year:   . Arboriculturist in the Last Year:   Transportation Needs:   . Film/video editor (Medical):   Marland Kitchen Lack of Transportation (Non-Medical):   Physical Activity:   . Days of Exercise per Week:   . Minutes of Exercise per  Session:   Stress:   . Feeling of Stress :   Social Connections:   . Frequency of Communication with Friends and Family:   . Frequency of Social Gatherings with Friends and Family:   . Attends Religious Services:   . Active Member of Clubs or Organizations:   . Attends Archivist Meetings:   Marland Kitchen Marital Status:   Intimate Partner Violence:   . Fear of Current or Ex-Partner:   . Emotionally Abused:   Marland Kitchen Physically Abused:   . Sexually Abused:      Physical Exam: Ht 5' 5"  (1.651 m)   Wt 183 lb 6.4 oz (83.2 kg)   BMI 30.52 kg/m  Constitutional: generally well-appearing Psychiatric: alert and oriented x3 Abdomen: soft, nontender, nondistended, no obvious ascites, no peritoneal signs, normal bowel sounds No peripheral edema noted in lower extremities  Assessment and plan: 64 y.o. female with Crohn's  ileocolitis  She is doing very well since her ileocecectomy 2 months ago.  I did explain to her that some of her loose stools now may be related to bile salt dysregulation.  I explained to her that I do not want to try her on cholestyramine just yet because she is still really recovering from that surgery and her bowels may even out quite a bit more.  She is not interested in any medicines anyway.  She will continue her Stelara every 6 weeks and she will return to see me in 6 months from now.  Please see the "Patient Instructions" section for addition details about the plan.  Owens Loffler, MD [facility] 05/06/2020, 9:12 AM   Total time on date of encounter was 30 minutes (this included time spent preparing to see the patient reviewing records; obtaining and/or reviewing separately obtained history; performing a medically appropriate exam and/or evaluation; counseling and educating the patient and family if present; ordering medications, tests or procedures if applicable; and documenting clinical information in the health record).

## 2020-07-07 ENCOUNTER — Other Ambulatory Visit: Payer: Self-pay

## 2020-07-07 DIAGNOSIS — K5080 Crohn's disease of both small and large intestine without complications: Secondary | ICD-10-CM

## 2020-07-17 ENCOUNTER — Telehealth: Payer: Self-pay | Admitting: Gastroenterology

## 2020-07-20 NOTE — Telephone Encounter (Signed)
The pt has been advised that prior auth received and completed as urgent.  She will be contacted as soon as we get a response.

## 2020-07-20 NOTE — Telephone Encounter (Signed)
Patient called states to indicate urgent on the prior auth form sent.

## 2020-07-20 NOTE — Telephone Encounter (Signed)
The pt has been advised to have the pharmacy send a prior auth and I will take care of it as soon as received.

## 2020-08-24 ENCOUNTER — Encounter: Payer: Self-pay | Admitting: Dietician

## 2020-08-24 ENCOUNTER — Encounter: Payer: 59 | Attending: Gastroenterology | Admitting: Dietician

## 2020-08-24 ENCOUNTER — Other Ambulatory Visit: Payer: Self-pay

## 2020-08-24 DIAGNOSIS — K50919 Crohn's disease, unspecified, with unspecified complications: Secondary | ICD-10-CM

## 2020-08-24 NOTE — Patient Instructions (Addendum)
Consider a chewable vitamin daily. Mindful eating:  Mindful choices  Eat slowly  Eat away distraction  Identify when you are satisfied Continue to stay active

## 2020-08-24 NOTE — Progress Notes (Signed)
Medical Nutrition Therapy:  Appt start time: 1450 end time:  1555.   Assessment:  Primary concerns today: .  Patient is here today alone.  She would like to lose weight.  She went on a low fat higher fiber diet and this caused a crohn's flair up.  Patient states that she cannot do keto due to the crohn's. She has tried NOOM.  She liked the psychological habit piece with eating but increased amounts of salads caused the flair up of crohn's. States that she might be a little depressed and thinks that poor eating and weight gain cycle is contributing to this. Increased back problems recently and feels that she needs to seen her surgeon again.  History includes Crohn's (dx about 1-2 years ago) - fever, tired all of the time, and pain in her side prior to diagnosis.  She is feeling better.  Does get diarrhea now when crohn's flairs. HLD, HTN A1C 6.7% 02/28/2020 which meets criteria for type 2 diabetes  Weight hx: 130 lbs when sick prior to crohn's diagnosis. Prednisone caused weight gain. 183 lbs is highest adult weight (current weight) Normal weight for her 150-160 lbs  Lost about 30 lbs and regained 50 lbs   Patient lives with her husband.  She does the shopping and cooking.  Her son's are in college.  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  Body Composition Scale Aug 24, 2020  Current Body Weight 187 lbs  Total Body Fat % 38.9  Visceral Fat 11  Fat-Free Mass % 61   Total Body Water % 45  Muscle-Mass lbs 29.6  BMI 30.8  Body Fat Displacement          Torso  lbs 44.9         Left Leg  lbs 8.9         Right Leg  lbs 8.9         Left Arm  lbs 4.4         Right Arm   lbs 4.4    Preferred Learning Style:   No preference indicated   Learning Readiness:   Ready  Change in progress   MEDICATIONS: see list   DIETARY INTAKE: Sometimes skips breakfast as she has tried intermittent fasting. "Can't stop eating when I am around food." Everyday foods include 2%  milk 1-2 cups daily.  Avoided foods include generally fried food.  Limits fruits and salads, seeds, nuts.    24-hr recall:  B ( AM): egg and cheese bagel  Snk ( AM):   L ( PM): 1/2 chicken sandwich with cheese on [employer] Killer bread Snk ( PM): oreo or Hershey nugget or cashews D ( PM): salad, rice, chicken or fish (baked) Snk ( PM): occasional bowl of ice cream Beverages: water, caffeine free diet coke (decreased to 10 ounces daily)  Usual physical activity: tennis 2-3 times per week for 90 minutes, plays golf (but rides in a cart), pickle ball once per week for 1 hour "back problems" - states that it is worse and needs to see her Neuro Surgeon  Estimated energy needs: 1200 calories 135 g carbohydrates 90 g protein 33 g fat  Progress Towards Goal(s):  In progress.   Nutritional Diagnosis:  NB-1.1 Food and nutrition-related knowledge deficit As related to balance of carbohydrate, protein, and fat.  As evidenced by diet hx and patient report.    Intervention:  Nutrition education related balance of carbohydrates, protein, and fat for  weight management as well as blood glucose control.  Meal plan provided and discussed sample meal planning. Discussed recommendations in relationship to crohn's disease.  Plan: Consider a chewable vitamin daily. Mindful eating:  Mindful choices  Eat slowly  Eat away distraction  Identify when you are satisfied Continue to stay active  Teaching Method Utilized:  Visual Auditory Hands on  Handouts given during visit include:  Diabetes My Plate  Meal plan card  Inflammatory Bowel Disease nutrition therapy from AND  Chewable MVI samples  Barriers to learning/adherence to lifestyle change: none  Demonstrated degree of understanding via:  Teach Back   Monitoring/Evaluation:  Dietary intake, exercise, label reading, and body weight in 2 month(s).

## 2020-11-23 ENCOUNTER — Ambulatory Visit: Payer: 59 | Admitting: Gastroenterology

## 2020-12-08 ENCOUNTER — Ambulatory Visit (INDEPENDENT_AMBULATORY_CARE_PROVIDER_SITE_OTHER): Payer: 59 | Admitting: Gastroenterology

## 2020-12-08 ENCOUNTER — Other Ambulatory Visit (INDEPENDENT_AMBULATORY_CARE_PROVIDER_SITE_OTHER): Payer: 59

## 2020-12-08 ENCOUNTER — Encounter: Payer: Self-pay | Admitting: Gastroenterology

## 2020-12-08 VITALS — BP 126/78 | HR 90 | Ht 65.0 in | Wt 186.6 lb

## 2020-12-08 DIAGNOSIS — R197 Diarrhea, unspecified: Secondary | ICD-10-CM

## 2020-12-08 DIAGNOSIS — K50819 Crohn's disease of both small and large intestine with unspecified complications: Secondary | ICD-10-CM | POA: Diagnosis not present

## 2020-12-08 LAB — CBC
HCT: 43.1 % (ref 36.0–46.0)
Hemoglobin: 14.6 g/dL (ref 12.0–15.0)
MCHC: 33.7 g/dL (ref 30.0–36.0)
MCV: 88.8 fl (ref 78.0–100.0)
Platelets: 258 10*3/uL (ref 150.0–400.0)
RBC: 4.86 Mil/uL (ref 3.87–5.11)
RDW: 13.3 % (ref 11.5–15.5)
WBC: 7 10*3/uL (ref 4.0–10.5)

## 2020-12-08 LAB — BASIC METABOLIC PANEL
BUN: 23 mg/dL (ref 6–23)
CO2: 25 mEq/L (ref 19–32)
Calcium: 9.7 mg/dL (ref 8.4–10.5)
Chloride: 105 mEq/L (ref 96–112)
Creatinine, Ser: 1.01 mg/dL (ref 0.40–1.20)
GFR: 59.74 mL/min — ABNORMAL LOW (ref >60.00)
Glucose, Bld: 127 mg/dL — ABNORMAL HIGH (ref 70–99)
Potassium: 3.9 mEq/L (ref 3.5–5.1)
Sodium: 139 mEq/L (ref 135–145)

## 2020-12-08 LAB — C-REACTIVE PROTEIN: CRP: 1.4 mg/dL (ref 0.5–20.0)

## 2020-12-08 MED ORDER — CHOLESTYRAMINE 4 G PO PACK: 4.0000 g | PACK | Freq: Every morning | ORAL | 3 refills | Status: DC

## 2020-12-08 NOTE — Patient Instructions (Signed)
If you are age 63 or younger, your body mass index should be between 19-25. Your Body mass index is 31.05 kg/m. If this is out of the aformentioned range listed, please consider follow up with your Primary Care Provider.   Your provider has requested that you go to the basement level for lab work before leaving today. Press "B" on the elevator. The lab is located at the first door on the left as you exit the elevator.  Due to recent changes in healthcare laws, you may see the results of your imaging and laboratory studies on MyChart before your provider has had a chance to review them.  We understand that in some cases there may be results that are confusing or concerning to you. Not all laboratory results come back in the same time frame and the provider may be waiting for multiple results in order to interpret others.  Please give [place] 48 hours in order for your provider to thoroughly review all the results before contacting the office for clarification of your results.   We have sent the following medications to your pharmacy for you to pick up at your convenience:  START: cholestyramine 4gm take one packet every morning.  We will contact you after the lab results come back.  Thank you for entrusting me with your care and choosing [facility].  Dr Ardis Hughs
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
## 2020-12-09 ENCOUNTER — Other Ambulatory Visit: Payer: 59

## 2020-12-09 DIAGNOSIS — K50819 Crohn's disease of both small and large intestine with unspecified complications: Secondary | ICD-10-CM

## 2020-12-09 DIAGNOSIS — R197 Diarrhea, unspecified: Secondary | ICD-10-CM

## 2020-12-13 LAB — GI PROFILE, STOOL, PCR

## 2020-12-14 ENCOUNTER — Telehealth: Payer: Self-pay | Admitting: Gastroenterology

## 2020-12-14 ENCOUNTER — Other Ambulatory Visit: Payer: Self-pay

## 2020-12-14 MED ORDER — VANCOMYCIN HCL 125 MG PO CAPS: 125.0000 mg | ORAL_CAPSULE | Freq: Four times a day (QID) | ORAL | 0 refills | Status: AC

## 2020-12-14 NOTE — Telephone Encounter (Signed)
Notified the lab that we have received the positive C diff test result and the pt is currently being treated.

## 2020-12-14 NOTE — Telephone Encounter (Signed)
Angela [name] from [place] is requesting a call back from a nurse regarding some lab results for the pt.  Reference# [id number]

## 2020-12-17 LAB — SERIAL MONITORING

## 2020-12-18 LAB — USTEKINUMAB AND ANTI-USTEK AB
Anti-Ustekinumab Antibody: <40 ng/mL
Ustekinumab: 12 ug/mL

## 2021-01-26 ENCOUNTER — Other Ambulatory Visit: Payer: 59

## 2021-01-26 ENCOUNTER — Telehealth: Payer: Self-pay | Admitting: Gastroenterology

## 2021-01-26 DIAGNOSIS — R197 Diarrhea, unspecified: Secondary | ICD-10-CM

## 2021-01-26 NOTE — Telephone Encounter (Signed)
Patient called and stated she thinks are infection is back and is seeking advise.

## 2021-01-26 NOTE — Telephone Encounter (Signed)
The pt has been advised to come in and pick up stool kit for C diff.  Orders have been entered.

## 2021-01-26 NOTE — Telephone Encounter (Signed)
Yes, thanks

## 2021-01-26 NOTE — Telephone Encounter (Signed)
The pt has a history of C diff in January 2022.  She felt much better after course of abx.  She began to have  diarrhea 3-5 watery episodes daily with pain on the lower left and lower right side about a week ago. Do you want to order a stool for C Diff/ path panel ?

## 2021-02-01 ENCOUNTER — Other Ambulatory Visit: Payer: 59

## 2021-02-01 DIAGNOSIS — R197 Diarrhea, unspecified: Secondary | ICD-10-CM

## 2021-02-03 ENCOUNTER — Telehealth: Payer: Self-pay | Admitting: Gastroenterology

## 2021-02-03 ENCOUNTER — Other Ambulatory Visit: Payer: Self-pay

## 2021-02-03 LAB — GI PROFILE, STOOL, PCR
Adenovirus F 40/41: NOT DETECTED
Astrovirus: NOT DETECTED
C difficile toxin A/B: DETECTED — AB
Campylobacter: NOT DETECTED
Cryptosporidium: NOT DETECTED
Cyclospora cayetanensis: NOT DETECTED
Entamoeba histolytica: NOT DETECTED
Enteroaggregative E coli: NOT DETECTED
Enteropathogenic E coli: NOT DETECTED
Enterotoxigenic E coli: NOT DETECTED
Giardia lamblia: NOT DETECTED
Norovirus GI/GII: DETECTED — AB
Plesiomonas shigelloides: NOT DETECTED
Rotavirus A: NOT DETECTED
Salmonella: NOT DETECTED
Sapovirus: NOT DETECTED
Shiga-toxin-producing E coli: NOT DETECTED
Shigella/Enteroinvasive E coli: NOT DETECTED
Vibrio cholerae: NOT DETECTED
Vibrio: NOT DETECTED
Yersinia enterocolitica: NOT DETECTED

## 2021-02-03 MED ORDER — FIDAXOMICIN 200 MG PO TABS: 200.0000 mg | ORAL_TABLET | Freq: Two times a day (BID) | ORAL | 0 refills | Status: AC

## 2021-02-03 NOTE — Telephone Encounter (Signed)
Angela [name] From [facility] is wanting to report an alert reading  Reference#: [id number]

## 2021-02-03 NOTE — Telephone Encounter (Signed)
Result received and report sent to the DOD.  Waiting for response.

## 2021-02-04 LAB — CLOSTRIDIUM DIFFICILE TOXIN B, QUALITATIVE, REAL-TIME PCR: Toxigenic C. Difficile by PCR: DETECTED — AB

## 2021-02-08 ENCOUNTER — Other Ambulatory Visit: Payer: 59

## 2021-02-08 ENCOUNTER — Encounter: Payer: Self-pay | Admitting: Gastroenterology

## 2021-02-08 ENCOUNTER — Ambulatory Visit (INDEPENDENT_AMBULATORY_CARE_PROVIDER_SITE_OTHER): Payer: 59 | Admitting: Gastroenterology

## 2021-02-08 ENCOUNTER — Telehealth: Payer: Self-pay

## 2021-02-08 DIAGNOSIS — K50919 Crohn's disease, unspecified, with unspecified complications: Secondary | ICD-10-CM | POA: Diagnosis not present

## 2021-02-08 DIAGNOSIS — A0472 Enterocolitis due to Clostridium difficile, not specified as recurrent: Secondary | ICD-10-CM

## 2021-02-08 MED ORDER — USTEKINUMAB 90 MG/ML ~~LOC~~ SOSY: 90.0000 mg | PREFILLED_SYRINGE | SUBCUTANEOUS | 6 refills | Status: DC

## 2021-02-08 NOTE — Progress Notes (Signed)
1.Crohns ileocolitis:Diagnosed 2019 with a inflammatory process in the right lower quadrant involving the cecum and terminal ileum. Eventual colonoscopy with Dr. Dulce Sellar April 2019 showed "congested, red, pseudopolypoid and scar area in the ascending colon, the proximal ascending colon and the IC valve, congested mucosa in the distal sigmoid" endoscopically this was suspicious for Crohn's disease. Pathology showed erosion, focal active cryptitis and mild crypt or distortion. No granulomas. She had a hyperplastic polyp removed. [facility] IBD testing was "positive for Crohn's disease". She was evaluated by Dr. Annye English at [facility] surgery he recommended second opinion GI and she transferred her care to [facility]. I did feel that she had Crohn's ileocolitis and started her on Humira June 2019.Colonoscopy February 2021DrArdis Hughs TI was stenotic with a pinpoint lumen which could not be intubated.. Just distal to the IC valve there was a frond-like mucosa though suspicious for colocolonic fistula described on recent CT. Right colon was normal-appearing, left colon was normal except for a 10 cm segment that appeared congested and edematous. Pathology showed no active inflammation or chronic changes.  Humira start June 2019:Significant side effects including extreme myalgias, double vision, significant injection site pain, flulike symptoms.Changed to stelara 08/2018  TB quant gold negative June 2019  Hepatitis B surface antibody and antigen were -June 2019  Pneumococcal immunizations started June 2019 with Prevnar13.  Stelara trough drug level 2.1ug/mL, no circulating antibodies(blood work August 2020);began process of every 6 week frequency dosing change.Started every 6 weeks Stelara October 2020.Trough level January 2021 greater than 10ug/mL  Lap assisted ileocolonic resection, lysis of adhesion April 2021 Dr. 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  3.  C. difficile infection: Presented with urgency, frequency.  January 2022 Stelara antibodies negative, Stelara drug level appropriate (12).  Proven by toxin PCR January 2022, remainder of GI path panel was negative.  Treated with vancomycin 125 mg p.o. 4 times daily for 2 weeks.    Recurrent, untreated C. difficile positive again by PCR toxin March 2022; started dificid 200 BID for 10 days   HPI: This is a very pleasant 65 year old woman whom I last saw 2 or 3 months ago here in the office.  Her initial C. difficile was treated with vancomycin course 2 weeks.  It completely resolved her symptoms however her frequency and urgency and nonbloody diarrhea returned a few weeks after completing the vancomycin.  We retested her for C. difficile and it was persistently positive.  She is about 3 days into her fidaxomicin 200 mg twice daily 10-day course now.  Within 1 or 2 days of starting the fidaxomicin her symptoms have almost completely resolved.  She needs her Stelara refilled for another year.   ROS: complete GI ROS as described in HPI, all other review negative.  Constitutional:  No unintentional weight loss   Past Medical History:  Diagnosis Date  . Crohn's colitis ([place])   . Diabetes mellitus without complication ([place])   . Family history of breast cancer   . Hyperlipidemia   . Hypertension     Past Surgical History:  Procedure Laterality Date  . CESAREAN SECTION     x2  . CHOLECYSTECTOMY  2011  . COLONOSCOPY  12/2019  . FLEXIBLE SIGMOIDOSCOPY  03/04/2020   Procedure: DIAGNOSTIC FLEXIBLE SIGMOIDOSCOPY;  Surgeon: Ileana Roup, MD;  [facility]: [facility];  Service: General;;  . KNEE SURGERY Right   .  LAPAROSCOPIC SUBTOTAL COLECTOMY N/A 03/04/2020   Procedure: LAPAROSCOPIC ILEOCOLECTOMY, LYSIS OF ADHESIONS,  BILATERAL TAP BLOCK;  Surgeon: Ileana Roup, MD;  Location: [facility];  Service: General;  Laterality: N/A;  . SPINE SURGERY     Herniated Disc  . tummy tuck  2001    Current Outpatient Medications  Medication Sig Dispense Refill  . atorvastatin (LIPITOR) 40 MG tablet Take 40 mg by mouth daily.    . fidaxomicin (DIFICID) 200 MG TABS tablet Take 1 tablet (200 mg total) by mouth 2 (two) times daily for 10 days. 20 tablet 0  . lisinopril (ZESTRIL) 10 MG tablet Take 10 mg by mouth daily.    . ustekinumab (STELARA) 90 MG/ML SOSY injection Inject 1 mL (90 mg total) into the skin every 6 (six) weeks. 1 mL 6   No current facility-administered medications for this visit.    Allergies as of 02/08/2021 - Review Complete 02/08/2021  Allergen Reaction Noted  . Humira [adalimumab] Other (See Comments) 10/05/2018  . Penicillins Hives 05/29/2014    Family History  Problem Relation Age of Onset  . Breast cancer Mother   . Esophageal cancer Father   . Leukemia Sister 5  . Breast cancer Maternal Aunt 52  . Breast cancer Maternal Grandmother        dx in her late 22s  . Heart attack Paternal Grandfather   . Cervical cancer Sister   . Colon cancer Neg Hx   . Liver cancer Neg Hx   . Stomach cancer Neg Hx     Social History   Socioeconomic History  . Marital status: Married    Spouse name: Not on file  . Number of children: 3  . Years of education: 47  . Highest education level: Not on file  Occupational History  . Occupation: realtor  Tobacco Use  . Smoking status: Former Smoker    Years: 20.00  . Smokeless tobacco: Never Used  Vaping Use  . Vaping Use: Never used  Substance and Sexual Activity  . Alcohol use: Yes    Comment: occasionally  . Drug use: No  . Sexual activity: Yes  Other Topics Concern  . Not on file  Social History Narrative  . Not on file   Social Determinants of Health   Financial Resource Strain: Not on file  Food Insecurity: Not on file   Transportation Needs: Not on file  Physical Activity: Not on file  Stress: Not on file  Social Connections: Not on file  Intimate Partner Violence: Not on file     Physical Exam: BP 130/78   Pulse 87   Ht 5' 5"  (1.651 m)   Wt 187 lb (84.8 kg)   BMI 31.12 kg/m   Constitutional: generally well-appearing Psychiatric: alert and oriented x3 Abdomen: soft, nontender, nondistended, no obvious ascites, no peritoneal signs, normal bowel sounds No peripheral edema noted in lower extremities  Assessment and plan: 65 y.o. female with Crohn's ileocolitis, C. difficile associated diarrhea  Her urgency, frequency, nonbloody diarrhea quickly response to both vancomycin and now Dificid.  Hopefully she will not need further antibiotics.  I recommended she start taking a probiotic pill once daily for the next 2 or 3 months.  This might help her get through her C. difficile troubles.  She needs TB quant gold tested, it has been over a year at least.  She needs refill of her Stelara at her current dose which is working quite well for her and we will work with  her [facility] pharmacy to ensure that there are no gaps in her dosing  She will return to see me in 2 to 3 months in the office, sooner if needed.    Please see the "Patient Instructions" section for addition details about the plan.  Owens Loffler, MD [facility] 02/08/2021, 9:20 AM   Total time on date of encounter was 30 minutes (this included time spent preparing to see the patient reviewing records; obtaining and/or reviewing separately obtained history; performing a medically appropriate exam and/or evaluation; counseling and educating the patient and family if present; ordering medications, tests or procedures if applicable; and documenting clinical information in the health record).

## 2021-02-08 NOTE — Telephone Encounter (Signed)
Stelara sent to [facility] for prior auth and refill.

## 2021-02-08 NOTE — Patient Instructions (Addendum)
If you are age 63 or older, your body mass index should be between 23-30. Your Body mass index is 31.12 kg/m. If this is out of the aforementioned range listed, please consider follow up with your Primary Care Provider.  If you are age 41 or younger, your body mass index should be between 19-25. Your Body mass index is 31.12 kg/m. If this is out of the aformentioned range listed, please consider follow up with your Primary Care Provider.   Your provider has requested that you go to the basement level for lab work before leaving today. Press "B" on the elevator. The lab is located at the first door on the left as you exit the elevator.  Due to recent changes in healthcare laws, you may see the results of your imaging and laboratory studies on MyChart before your provider has had a chance to review them.  We understand that in some cases there may be results that are confusing or concerning to you. Not all laboratory results come back in the same time frame and the provider may be waiting for multiple results in order to interpret others.  Please give [place] 48 hours in order for your provider to thoroughly review all the results before contacting the office for clarification of your results.   Please purchase the following medications over the counter and take as directed:  START: probiotic daily  Please complete dose of antibiotics that you are currently taking.  We will arrange for Stelara to be refilled for one year.  If you have any questions or concerns, please contact Patty, RN.  You are scheduled to follow up on 04-21-2021 at 8:50am.  Thank you for entrusting me with your care and choosing [facility].  Dr Ardis Hughs

## 2021-02-08 NOTE — Telephone Encounter (Signed)
Patient was seen in the office today by Dr Ardis Hughs.  She is going to need Stelara approved for 1 year per Dr Ardis Hughs. She is scheduled to have her last injection in 4 to 5 weeks. He would like to continue current dose of 70m every 6 weeks.    Thank you

## 2021-02-10 LAB — QUANTIFERON-TB GOLD PLUS
Mitogen-NIL: >10 IU/mL
NIL: 0.02 IU/mL
QuantiFERON-TB Gold Plus: NEGATIVE
TB1-NIL: 0 IU/mL
TB2-NIL: 0 IU/mL

## 2021-02-22 ENCOUNTER — Telehealth: Payer: Self-pay | Admitting: Gastroenterology

## 2021-02-22 NOTE — Telephone Encounter (Signed)
Patient called states she thinks she is has another intestinal infection requesting a call from nurse to advise.

## 2021-02-23 MED ORDER — VANCOMYCIN HCL 125 MG PO CAPS: ORAL_CAPSULE | ORAL | 0 refills | Status: DC

## 2021-02-23 NOTE — Telephone Encounter (Signed)
The pt has been advised. Appt previously scheduled.  Prescription sent to the pharmacy.  The pt will call with any further concerns.

## 2021-02-23 NOTE — Telephone Encounter (Signed)
The pt has a history of C diff; she has been treated with 2 courses of abx.  The first was vanc four times daily for 14 days.   C diff quickly returned so she was put on Dificid. She did ok until last week.  She began to have watery bowel movements 3-4 x daily with 100 F temp, generalized abd pain.  Please advise

## 2021-02-23 NOTE — Telephone Encounter (Signed)
Ok, lets proceed with long tapering vanco.  Vancomycin 156m PO QID for 2 weeks, then TID for 2 weeks, then BID for 2 weeks, then QD for 2 weeks, then QOD for two weeks.   OV with myself or extender in 3-4 weeks.  Thanks

## 2021-03-04 ENCOUNTER — Other Ambulatory Visit: Payer: Self-pay

## 2021-03-04 MED ORDER — USTEKINUMAB 90 MG/ML ~~LOC~~ SOSY: 90.0000 mg | PREFILLED_SYRINGE | SUBCUTANEOUS | 6 refills | Status: DC

## 2021-03-29 ENCOUNTER — Other Ambulatory Visit: Payer: Self-pay | Admitting: Gastroenterology

## 2021-03-29 NOTE — Telephone Encounter (Addendum)
Patient called requesting a RF on the Vancomycin said it had to be split and she still has 3 weeks left to take it.

## 2021-03-30 ENCOUNTER — Other Ambulatory Visit: Payer: Self-pay

## 2021-03-30 MED ORDER — VANCOMYCIN HCL 125 MG PO CAPS: ORAL_CAPSULE | ORAL | 0 refills | Status: AC

## 2021-03-30 NOTE — Telephone Encounter (Signed)
The pt was only given enough vanc for 56 days.  She needs the prescription sent for 2 per day for 14 days, 1 daily for 14 days and 1 every other day.  That has been sent and the pt has been advised.

## 2021-04-21 ENCOUNTER — Ambulatory Visit (INDEPENDENT_AMBULATORY_CARE_PROVIDER_SITE_OTHER): Payer: 59 | Admitting: Gastroenterology

## 2021-04-21 ENCOUNTER — Encounter: Payer: Self-pay | Admitting: Gastroenterology

## 2021-04-21 DIAGNOSIS — K50919 Crohn's disease, unspecified, with unspecified complications: Secondary | ICD-10-CM | POA: Diagnosis not present

## 2021-04-21 DIAGNOSIS — R197 Diarrhea, unspecified: Secondary | ICD-10-CM

## 2021-04-21 MED ORDER — SUPREP BOWEL PREP KIT 17.5-3.13-1.6 GM/177ML PO SOLN: 1.0000 | ORAL | 0 refills | Status: DC

## 2021-04-21 NOTE — Patient Instructions (Signed)
If you are age 63 or younger, your body mass index should be between 19-25. Your Body mass index is 30.79 kg/m. If this is out of the aformentioned range listed, please consider follow up with your Primary Care Provider.   __________________________________________________________  The [facility] providers would like to encourage you to use [facility] to communicate with providers for non-urgent requests or questions.  Due to long hold times on the telephone, sending your provider a message by [facility] may be a faster and more efficient way to get a response.  Please allow 48 business hours for a response.  Please remember that this is for non-urgent requests.   You have been scheduled for a colonoscopy. Please follow written instructions given to you at your visit today.  Please pick up your prep supplies at the pharmacy within the next 1-3 days. If you use inhalers (even only as needed), please bring them with you on the day of your procedure.  Due to recent changes in healthcare laws, you may see the results of your imaging and laboratory studies on MyChart before your provider has had a chance to review them.  We understand that in some cases there may be results that are confusing or concerning to you. Not all laboratory results come back in the same time frame and the provider may be waiting for multiple results in order to interpret others.  Please give [place] 48 hours in order for your provider to thoroughly review all the results before contacting the office for clarification of your results.   Thank you for entrusting me with your care and choosing [facility].  Dr Ardis Hughs

## 2021-04-21 NOTE — H&P (View-Only) (Signed)
1.Crohns ileocolitis:Diagnosed 2019 with a inflammatory process in the right lower quadrant involving the cecum and terminal ileum. Eventual colonoscopy with Dr. Dulce Sellar April 2019 showed "congested, red, pseudopolypoid and scar area in the ascending colon, the proximal ascending colon and the IC valve, congested mucosa in the distal sigmoid" endoscopically this was suspicious for Crohn's disease. Pathology showed erosion, focal active cryptitis and mild crypt or distortion. No granulomas. She had a hyperplastic polyp removed. [facility] IBD testing was "positive for Crohn's disease". She was evaluated by Dr. Annye English at [facility] surgery he recommended second opinion GI and she transferred her care to [facility]. I did feel that she had Crohn's ileocolitis and started her on Humira June 2019.Colonoscopy February 2021DrArdis Hughs TI was stenotic with a pinpoint lumen which could not be intubated.. Just distal to the IC valve there was a frond-like mucosa though suspicious for colocolonic fistula described on recent CT. Right colon was normal-appearing, left colon was normal except for a 10 cm segment that appeared congested and edematous. Pathology showed no active inflammation or chronic changes.  Humira start June 2019:Significant side effects including extreme myalgias, double vision, significant injection site pain, flulike symptoms.Changed to stelara 08/2018  TB quant gold negative June 2019  Hepatitis B surface antibody and antigen were -June 2019  Pneumococcal immunizations started June 2019 with Prevnar13.  Stelara trough drug level 2.1ug/mL, no circulating antibodies(blood work August 2020);began process of every 6 week frequency dosing change.Started every 6 weeks Stelara October 2020.Trough level January 2021 greater than 10ug/mL  Lap assisted ileocolonic resection, lysis of adhesion April 2021Dr. 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  3.  C. difficile infection: Presented with urgency, frequency.  January 2022 Stelara antibodies negative, Stelara drug level appropriate (12).  Proven by toxin PCR January 2022, remainder of GI path panel was negative.  Treated with vancomycin 125 mg p.o. 4 times daily for 2 weeks.    Recurrent, untreated C. difficile positive again by PCR toxin March 2022; started dificid 200 BID for 10 days  HPI: This is a very pleasant 65 year old woman  I last saw her 2 and half months ago, late March.  Her urgency, frequency, nonbloody diarrhea quickly responded to both vancomycin and then Dificid.  I recommended she start taking a probiotic once daily.  She called back 2 or 3 weeks later.  There was concerned that the C. difficile was recurrent again.  I called in a long tapering course of vancomycin orally.  She has been on this long tapering course of vancomycin.  She recently changed to every other day vancomycin.  She is not sure if she still has the Clostridium infection.  She is moving her bowels 5-6 times a day.  She has chronic right-sided abdominal pains and some upper abdominal pains with Valsalva.  She is scheduled to have a back surgery on 14 June and was told that if she is still on vancomycin that that surgery must be canceled   ROS: complete GI ROS as described in HPI, all other review negative.  Constitutional:  No unintentional weight loss   Past Medical History:  Diagnosis Date  . Crohn's colitis ([place])   . Diabetes mellitus without complication ([place])   . Family history of breast cancer   . Hyperlipidemia   . Hypertension     Past Surgical History:  Procedure Laterality Date  .  CESAREAN SECTION     x2  . CHOLECYSTECTOMY  2011  . COLONOSCOPY  12/2019  . FLEXIBLE SIGMOIDOSCOPY  03/04/2020    Procedure: DIAGNOSTIC FLEXIBLE SIGMOIDOSCOPY;  Surgeon: Ileana Roup, MD;  Location: [facility];  Service: General;;  . KNEE SURGERY Right   . LAPAROSCOPIC SUBTOTAL COLECTOMY N/A 03/04/2020   Procedure: LAPAROSCOPIC ILEOCOLECTOMY, LYSIS OF ADHESIONS, BILATERAL TAP BLOCK;  Surgeon: Ileana Roup, MD;  Location: [facility];  Service: General;  Laterality: N/A;  . SPINE SURGERY     Herniated Disc  . tummy tuck  2001    Current Outpatient Medications  Medication Sig Dispense Refill  . atorvastatin (LIPITOR) 40 MG tablet Take 40 mg by mouth daily.    Marland Kitchen lisinopril (ZESTRIL) 10 MG tablet Take 10 mg by mouth daily.    . ustekinumab (STELARA) 90 MG/ML SOSY injection Inject 1 mL (90 mg total) into the skin every 6 (six) weeks. 1 mL 6  . vancomycin (VANCOCIN HCL) 125 MG capsule Take 1 capsule (125 mg total) by mouth in the morning and at bedtime for 7 days, THEN 1 capsule (125 mg total) daily for 14 days, THEN 1 capsule (125 mg total) every other day for 14 days. 35 capsule 0   No current facility-administered medications for this visit.    Allergies as of 04/21/2021 - Review Complete 04/21/2021  Allergen Reaction Noted  . Humira [adalimumab] Other (See Comments) 10/05/2018  . Penicillins Hives 05/29/2014    Family History  Problem Relation Age of Onset  . Breast cancer Mother   . Esophageal cancer Father   . Leukemia Sister 5  . Breast cancer Maternal Aunt 21  . Breast cancer Maternal Grandmother        dx in her late 54s  . Heart attack Paternal Grandfather   . Cervical cancer Sister   . Colon cancer Neg Hx   . Liver cancer Neg Hx   . Stomach cancer Neg Hx     Social History   Socioeconomic History  . Marital status: Married    Spouse name: Not on file  . Number of children: 3  . Years of education: 91  . Highest education level: Not on file  Occupational History  . Occupation: realtor  Tobacco Use  . Smoking status: Former Smoker    Years: 20.00  . Smokeless  tobacco: Never Used  Vaping Use  . Vaping Use: Never used  Substance and Sexual Activity  . Alcohol use: Yes    Comment: occasionally  . Drug use: No  . Sexual activity: Yes  Other Topics Concern  . Not on file  Social History Narrative  . Not on file   Social Determinants of Health   Financial Resource Strain: Not on file  Food Insecurity: Not on file  Transportation Needs: Not on file  Physical Activity: Not on file  Stress: Not on file  Social Connections: Not on file  Intimate Partner Violence: Not on file     Physical Exam: BP 134/78 (BP Location: Left Arm, Patient Position: Sitting, Cuff Size: Normal)   Pulse 68   Ht 5' 5"  (1.651 m)   Wt 185 lb (83.9 kg)   SpO2 98%   BMI 30.79 kg/m  Constitutional: generally well-appearing Psychiatric: alert and oriented x3 Abdomen: soft, nontender, nondistended, no obvious ascites, no peritoneal signs, normal bowel sounds No peripheral edema noted in lower extremities  Assessment and plan: 65 y.o. female with Crohn's disease, history of C. difficile infection  It is very difficult to know if she still has C. difficile infection but I tend to think she does not.  I wonder if she is having issues with colorectal diarrhea from losing her ileocecal valve.  I recommended examining her colon with a colonoscopy at her soonest convenience.  She will continue to taper off of the vancomycin in the meantime.  Please see the "Patient Instructions" section for addition details about the plan.  Owens Loffler, MD [facility] 04/21/2021, 9:04 AM   Total time on date of encounter was 30 minutes (this included time spent preparing to see the patient reviewing records; obtaining and/or reviewing separately obtained history; performing a medically appropriate exam and/or evaluation; counseling and educating the patient and family if present; ordering medications, tests or procedures if applicable; and documenting clinical information  in the health record).

## 2021-04-21 NOTE — Addendum Note (Signed)
Addended by: Stevan Born on: 04/21/2021 02:05 PM   Modules accepted: Orders, SmartSet

## 2021-04-21 NOTE — Progress Notes (Signed)
1.Crohns ileocolitis:Diagnosed 2019 with a inflammatory process in the right lower quadrant involving the cecum and terminal ileum. Eventual colonoscopy with Dr. Dulce Sellar April 2019 showed "congested, red, pseudopolypoid and scar area in the ascending colon, the proximal ascending colon and the IC valve, congested mucosa in the distal sigmoid" endoscopically this was suspicious for Crohn's disease. Pathology showed erosion, focal active cryptitis and mild crypt or distortion. No granulomas. She had a hyperplastic polyp removed. [facility] IBD testing was "positive for Crohn's disease". She was evaluated by Dr. Annye English at [facility] surgery he recommended second opinion GI and she transferred her care to [facility]. I did feel that she had Crohn's ileocolitis and started her on Humira June 2019.Colonoscopy February 2021DrArdis Hughs TI was stenotic with a pinpoint lumen which could not be intubated.. Just distal to the IC valve there was a frond-like mucosa though suspicious for colocolonic fistula described on recent CT. Right colon was normal-appearing, left colon was normal except for a 10 cm segment that appeared congested and edematous. Pathology showed no active inflammation or chronic changes.  Humira start June 2019:Significant side effects including extreme myalgias, double vision, significant injection site pain, flulike symptoms.Changed to stelara 08/2018  TB quant gold negative June 2019  Hepatitis B surface antibody and antigen were -June 2019  Pneumococcal immunizations started June 2019 with Prevnar13.  Stelara trough drug level 2.1ug/mL, no circulating antibodies(blood work August 2020);began process of every 6 week frequency dosing change.Started every 6 weeks Stelara October 2020.Trough level January 2021 greater than 10ug/mL  Lap assisted ileocolonic resection, lysis of adhesion April 2021Dr. 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  3.  C. difficile infection: Presented with urgency, frequency.  January 2022 Stelara antibodies negative, Stelara drug level appropriate (12).  Proven by toxin PCR January 2022, remainder of GI path panel was negative.  Treated with vancomycin 125 mg p.o. 4 times daily for 2 weeks.    Recurrent, untreated C. difficile positive again by PCR toxin March 2022; started dificid 200 BID for 10 days  HPI: This is a very pleasant 65 year old woman  I last saw her 2 and half months ago, late March.  Her urgency, frequency, nonbloody diarrhea quickly responded to both vancomycin and then Dificid.  I recommended she start taking a probiotic once daily.  She called back 2 or 3 weeks later.  There was concerned that the C. difficile was recurrent again.  I called in a long tapering course of vancomycin orally.  She has been on this long tapering course of vancomycin.  She recently changed to every other day vancomycin.  She is not sure if she still has the Clostridium infection.  She is moving her bowels 5-6 times a day.  She has chronic right-sided abdominal pains and some upper abdominal pains with Valsalva.  She is scheduled to have a back surgery on 14 June and was told that if she is still on vancomycin that that surgery must be canceled   ROS: complete GI ROS as described in HPI, all other review negative.  Constitutional:  No unintentional weight loss   Past Medical History:  Diagnosis Date  . Crohn's colitis ([place])   . Diabetes mellitus without complication ([place])   . Family history of breast cancer   . Hyperlipidemia   . Hypertension     Past Surgical History:  Procedure Laterality Date  .  CESAREAN SECTION     x2  . CHOLECYSTECTOMY  2011  . COLONOSCOPY  12/2019  . FLEXIBLE SIGMOIDOSCOPY  03/04/2020    Procedure: DIAGNOSTIC FLEXIBLE SIGMOIDOSCOPY;  Surgeon: Ileana Roup, MD;  Location: [facility];  Service: General;;  . KNEE SURGERY Right   . LAPAROSCOPIC SUBTOTAL COLECTOMY N/A 03/04/2020   Procedure: LAPAROSCOPIC ILEOCOLECTOMY, LYSIS OF ADHESIONS, BILATERAL TAP BLOCK;  Surgeon: Ileana Roup, MD;  Location: [facility];  Service: General;  Laterality: N/A;  . SPINE SURGERY     Herniated Disc  . tummy tuck  2001    Current Outpatient Medications  Medication Sig Dispense Refill  . atorvastatin (LIPITOR) 40 MG tablet Take 40 mg by mouth daily.    Marland Kitchen lisinopril (ZESTRIL) 10 MG tablet Take 10 mg by mouth daily.    . ustekinumab (STELARA) 90 MG/ML SOSY injection Inject 1 mL (90 mg total) into the skin every 6 (six) weeks. 1 mL 6  . vancomycin (VANCOCIN HCL) 125 MG capsule Take 1 capsule (125 mg total) by mouth in the morning and at bedtime for 7 days, THEN 1 capsule (125 mg total) daily for 14 days, THEN 1 capsule (125 mg total) every other day for 14 days. 35 capsule 0   No current facility-administered medications for this visit.    Allergies as of 04/21/2021 - Review Complete 04/21/2021  Allergen Reaction Noted  . Humira [adalimumab] Other (See Comments) 10/05/2018  . Penicillins Hives 05/29/2014    Family History  Problem Relation Age of Onset  . Breast cancer Mother   . Esophageal cancer Father   . Leukemia Sister 5  . Breast cancer Maternal Aunt 80  . Breast cancer Maternal Grandmother        dx in her late 9s  . Heart attack Paternal Grandfather   . Cervical cancer Sister   . Colon cancer Neg Hx   . Liver cancer Neg Hx   . Stomach cancer Neg Hx     Social History   Socioeconomic History  . Marital status: Married    Spouse name: Not on file  . Number of children: 3  . Years of education: 46  . Highest education level: Not on file  Occupational History  . Occupation: realtor  Tobacco Use  . Smoking status: Former Smoker    Years: 20.00  . Smokeless  tobacco: Never Used  Vaping Use  . Vaping Use: Never used  Substance and Sexual Activity  . Alcohol use: Yes    Comment: occasionally  . Drug use: No  . Sexual activity: Yes  Other Topics Concern  . Not on file  Social History Narrative  . Not on file   Social Determinants of Health   Financial Resource Strain: Not on file  Food Insecurity: Not on file  Transportation Needs: Not on file  Physical Activity: Not on file  Stress: Not on file  Social Connections: Not on file  Intimate Partner Violence: Not on file     Physical Exam: BP 134/78 (BP Location: Left Arm, Patient Position: Sitting, Cuff Size: Normal)   Pulse 68   Ht 5' 5"  (1.651 m)   Wt 185 lb (83.9 kg)   SpO2 98%   BMI 30.79 kg/m  Constitutional: generally well-appearing Psychiatric: alert and oriented x3 Abdomen: soft, nontender, nondistended, no obvious ascites, no peritoneal signs, normal bowel sounds No peripheral edema noted in lower extremities  Assessment and plan: 66 y.o. female with Crohn's disease, history of C. difficile infection  It is very difficult to know if she still has C. difficile infection but I tend to think she does not.  I wonder if she is having issues with colorectal diarrhea from losing her ileocecal valve.  I recommended examining her colon with a colonoscopy at her soonest convenience.  She will continue to taper off of the vancomycin in the meantime.  Please see the "Patient Instructions" section for addition details about the plan.  Owens Loffler, MD [facility] 04/21/2021, 9:04 AM   Total time on date of encounter was 30 minutes (this included time spent preparing to see the patient reviewing records; obtaining and/or reviewing separately obtained history; performing a medically appropriate exam and/or evaluation; counseling and educating the patient and family if present; ordering medications, tests or procedures if applicable; and documenting clinical information  in the health record).

## 2021-04-23 ENCOUNTER — Encounter: Payer: 59 | Admitting: Gastroenterology

## 2021-04-26 ENCOUNTER — Encounter: Payer: Self-pay | Admitting: Gastroenterology

## 2021-04-27 ENCOUNTER — Other Ambulatory Visit: Payer: Self-pay

## 2021-04-27 ENCOUNTER — Encounter (HOSPITAL_COMMUNITY): Payer: Self-pay | Admitting: Gastroenterology

## 2021-04-29 ENCOUNTER — Ambulatory Visit (HOSPITAL_COMMUNITY): Payer: 59 | Admitting: Anesthesiology

## 2021-04-29 ENCOUNTER — Ambulatory Visit (HOSPITAL_COMMUNITY)
Admission: RE | Admit: 2021-04-29 | Discharge: 2021-04-29 | Disposition: A | Discharge status: Home / Self Care | Payer: 59 | Attending: Gastroenterology | Admitting: Gastroenterology

## 2021-04-29 ENCOUNTER — Encounter (HOSPITAL_COMMUNITY)
Admission: RE | Disposition: A | Discharge status: Home / Self Care | Payer: Self-pay | Source: Home / Self Care | Attending: Gastroenterology

## 2021-04-29 ENCOUNTER — Other Ambulatory Visit: Payer: Self-pay

## 2021-04-29 ENCOUNTER — Encounter (HOSPITAL_COMMUNITY): Payer: Self-pay | Admitting: Gastroenterology

## 2021-04-29 DIAGNOSIS — Z8249 Family history of ischemic heart disease and other diseases of the circulatory system: Secondary | ICD-10-CM | POA: Diagnosis not present

## 2021-04-29 DIAGNOSIS — I10 Essential (primary) hypertension: Secondary | ICD-10-CM | POA: Insufficient documentation

## 2021-04-29 DIAGNOSIS — D72828 Other elevated white blood cell count: Secondary | ICD-10-CM | POA: Diagnosis not present

## 2021-04-29 DIAGNOSIS — Z800 Family history of malignant neoplasm of digestive organs: Secondary | ICD-10-CM | POA: Diagnosis not present

## 2021-04-29 DIAGNOSIS — Z8049 Family history of malignant neoplasm of other genital organs: Secondary | ICD-10-CM | POA: Insufficient documentation

## 2021-04-29 DIAGNOSIS — Z806 Family history of leukemia: Secondary | ICD-10-CM | POA: Insufficient documentation

## 2021-04-29 DIAGNOSIS — Z9049 Acquired absence of other specified parts of digestive tract: Secondary | ICD-10-CM | POA: Insufficient documentation

## 2021-04-29 DIAGNOSIS — E119 Type 2 diabetes mellitus without complications: Secondary | ICD-10-CM | POA: Diagnosis not present

## 2021-04-29 DIAGNOSIS — Z880 Allergy status to penicillin: Secondary | ICD-10-CM | POA: Diagnosis not present

## 2021-04-29 DIAGNOSIS — R197 Diarrhea, unspecified: Secondary | ICD-10-CM

## 2021-04-29 DIAGNOSIS — Z79899 Other long term (current) drug therapy: Secondary | ICD-10-CM | POA: Insufficient documentation

## 2021-04-29 DIAGNOSIS — R1031 Right lower quadrant pain: Secondary | ICD-10-CM | POA: Diagnosis present

## 2021-04-29 DIAGNOSIS — Z87891 Personal history of nicotine dependence: Secondary | ICD-10-CM | POA: Diagnosis not present

## 2021-04-29 DIAGNOSIS — K5010 Crohn's disease of large intestine without complications: Secondary | ICD-10-CM | POA: Insufficient documentation

## 2021-04-29 DIAGNOSIS — Z803 Family history of malignant neoplasm of breast: Secondary | ICD-10-CM | POA: Diagnosis not present

## 2021-04-29 DIAGNOSIS — K50919 Crohn's disease, unspecified, with unspecified complications: Secondary | ICD-10-CM

## 2021-04-29 DIAGNOSIS — K6389 Other specified diseases of intestine: Secondary | ICD-10-CM | POA: Insufficient documentation

## 2021-04-29 HISTORY — DX: Nausea with vomiting, unspecified: R11.2

## 2021-04-29 HISTORY — DX: Other specified postprocedural states: Z98.890

## 2021-04-29 HISTORY — PX: BIOPSY: SHX5522

## 2021-04-29 HISTORY — PX: COLONOSCOPY WITH PROPOFOL: SHX5780

## 2021-04-29 LAB — GLUCOSE, CAPILLARY: Glucose-Capillary: 131 mg/dL — ABNORMAL HIGH (ref 70–99)

## 2021-04-29 SURGERY — COLONOSCOPY WITH PROPOFOL
Anesthesia: Monitor Anesthesia Care

## 2021-04-29 MED ORDER — SODIUM CHLORIDE 0.9 % IV SOLN: INTRAVENOUS | Status: DC

## 2021-04-29 MED ORDER — LACTATED RINGERS IV SOLN
INTRAVENOUS | Status: DC
  Administered 2021-04-29: 1000 mL via INTRAVENOUS

## 2021-04-29 MED ORDER — LIDOCAINE 2% (20 MG/ML) 5 ML SYRINGE
INTRAMUSCULAR | Status: DC | PRN
  Administered 2021-04-29: 100 mg via INTRAVENOUS

## 2021-04-29 MED ORDER — PROPOFOL 10 MG/ML IV BOLUS
INTRAVENOUS | Status: AC
  Filled 2021-04-29: qty 20

## 2021-04-29 MED ORDER — PROPOFOL 10 MG/ML IV BOLUS
INTRAVENOUS | Status: DC | PRN
  Administered 2021-04-29: 20 mg via INTRAVENOUS

## 2021-04-29 MED ORDER — PROPOFOL 500 MG/50ML IV EMUL
INTRAVENOUS | Status: DC | PRN
  Administered 2021-04-29: 125 ug/kg/min via INTRAVENOUS

## 2021-04-29 MED ORDER — PROPOFOL 500 MG/50ML IV EMUL
INTRAVENOUS | Status: AC
  Filled 2021-04-29: qty 50

## 2021-04-29 SURGICAL SUPPLY — 22 items

## 2021-04-29 NOTE — Interval H&P Note (Signed)
History and Physical Interval Note:  04/29/2021 8:53 AM  Angela [name]  has presented today for surgery, with the diagnosis of chronic diarrhea, crohn's disease.  The various methods of treatment have been discussed with the patient and family. After consideration of risks, benefits and other options for treatment, the patient has consented to  Procedure(s): COLONOSCOPY WITH PROPOFOL (N/A) as a surgical intervention.  The patient's history has been reviewed, patient examined, no change in status, stable for surgery.  I have reviewed the patient's chart and labs.  Questions were answered to the patient's satisfaction.     Milus Banister

## 2021-04-29 NOTE — Transfer of Care (Signed)
Immediate Anesthesia Transfer of Care Note  Patient: Angela [name]  Procedure(s) Performed: COLONOSCOPY WITH PROPOFOL BIOPSY  Patient Location: Endoscopy Unit  Anesthesia Type:MAC  Level of Consciousness: awake and alert   Airway & Oxygen Therapy: Patient Spontanous Breathing and Patient connected to face mask oxygen  Post-op Assessment: Report given to RN and Post -op Vital signs reviewed and stable  Post vital signs: Reviewed and stable  Last Vitals:  Vitals Value Taken Time  BP    Temp    Pulse 62 04/29/21 0932  Resp 17 04/29/21 0932  SpO2 100 % 04/29/21 0932  Vitals shown include unvalidated device data.  Last Pain:  Vitals:   04/29/21 0839  TempSrc: Oral  PainSc: 0-No pain         Complications: No notable events documented.

## 2021-04-29 NOTE — Op Note (Signed)
[facility] Patient Name: Angela [name] Procedure Date: 04/29/2021 MRN: [id number] Attending MD: Milus Banister , MD Date of Birth: Dec 04, 1955 CSN: [id number] Age: 65 Admit Type: Outpatient Procedure:                Colonoscopy Indications:              Abdominal pain in the right lower quadrant s/p 2021                            ileocolectomy for Crohn's disease, stricture at TI                            with fistula Providers:                Milus Banister, MD, Dulcy Fanny, Cletis [place], Technician, Danley Danker, CRNA Referring MD:              Medicines:                Monitored Anesthesia Care Complications:            No immediate complications. Estimated blood loss:                            None. Estimated Blood Loss:     Estimated blood loss: none. Procedure:                Pre-Anesthesia Assessment:                           - Prior to the procedure, a History and Physical                            was performed, and patient medications and                            allergies were reviewed. The patient's tolerance of                            previous anesthesia was also reviewed. The risks                            and benefits of the procedure and the sedation                            options and risks were discussed with the patient.                            All questions were answered, and informed consent                            was obtained. Prior Anticoagulants: The patient has                            taken  no previous anticoagulant or antiplatelet                            agents. ASA Grade Assessment: II - A patient with                            mild systemic disease. After reviewing the risks                            and benefits, the patient was deemed in                            satisfactory condition to undergo the procedure.                           After obtaining informed consent, the  colonoscope                            was passed under direct vision. Throughout the                            procedure, the patient's blood pressure, pulse, and                            oxygen saturations were monitored continuously. The                            PCF-[id number] ([id number]) Olympus pediatric colonscope                            was introduced through the anus and advanced to the                            the ileocolonic anastomosis. The colonoscopy was                            performed without difficulty. The patient tolerated                            the procedure well. The quality of the bowel                            preparation was good. The rectum was photographed. Scope In: 9:13:23 AM Scope Out: 9:25:49 AM Total Procedure Duration: 0 hours 12 minutes 26 seconds  Findings:      Double barrel ileo-colonic anastomosis. I was unable to intubate the neo       terminal ileum (due to apparent tortuosity, there there was not obvious       inflammation at the site). Biopsies taken from the site.      Colonic mucosa was normal throughout, random biopsies taken      The exam was otherwise without abnormality on direct and retroflexion       views. Impression:               - Double barrel ileo-colonic  anastomosis. I was                            unable to intubate the neo terminal ileum (due to                            apparent tortuosity, there there was not obvious                            inflammation at the site). Biopsies taken from the                            site.                           - Colonic mucosa was normal throughout, random                            biopsies taken.                           - There is clearly no clostridium difficile                            infection in the colon. Moderate Sedation:      Not Applicable - Patient had care per Anesthesia. Recommendation:           - Patient has a contact number available for                             emergencies. The signs and symptoms of potential                            delayed complications were discussed with the                            patient. Return to normal activities tomorrow.                            Written discharge instructions were provided to the                            patient.                           - Resume previous diet.                           - Continue present medications.                           - Await pathology results.                           - If loose stools return will try once daily  cholestyramine. Procedure Code(s):        --- Professional ---                           [phone], Colonoscopy, flexible; with biopsy, single                            or multiple Diagnosis Code(s):        --- Professional ---                           R10.31, Right lower quadrant pain CPT copyright 2019 American Medical Association. All rights reserved. The codes documented in this report are preliminary and upon coder review may  be revised to meet current compliance requirements. Milus Banister, MD 04/29/2021 9:36:45 AM This report has been signed electronically. Number of Addenda: 0

## 2021-04-29 NOTE — Anesthesia Preprocedure Evaluation (Signed)
Anesthesia Evaluation  Patient identified by MRN, date of birth, ID band Patient awake    History of Anesthesia Complications (+) PONV  Airway Mallampati: II  TM Distance: >3 FB Neck ROM: Full    Dental  (+) Teeth Intact   Pulmonary neg pulmonary ROS, former smoker,    Pulmonary exam normal        Cardiovascular hypertension, Pt. on medications  Rhythm:Regular Rate:Normal     Neuro/Psych negative neurological ROS  negative psych ROS   GI/Hepatic Neg liver ROS, Crohn's disease   Endo/Other  diabetes, Type 2, Oral Hypoglycemic Agents  Renal/GU   negative genitourinary   Musculoskeletal negative musculoskeletal ROS (+)   Abdominal (+)  Abdomen: soft. Bowel sounds: normal.  Peds  Hematology  (+) anemia ,   Anesthesia Other Findings   Reproductive/Obstetrics                             Anesthesia Physical Anesthesia Plan  ASA: 2  Anesthesia Plan: MAC   Post-op Pain Management:    Induction: Intravenous  PONV Risk Score and Plan: 3 and Propofol infusion and Treatment may vary due to age or medical condition  Airway Management Planned: Simple Face Mask, Natural Airway and Nasal Cannula  Additional Equipment: None  Intra-op Plan:   Post-operative Plan:   Informed Consent: I have reviewed the patients History and Physical, chart, labs and discussed the procedure including the risks, benefits and alternatives for the proposed anesthesia with the patient or authorized representative who has indicated his/her understanding and acceptance.     Dental advisory given  Plan Discussed with: CRNA  Anesthesia Plan Comments:         Anesthesia Quick Evaluation

## 2021-04-29 NOTE — Anesthesia Postprocedure Evaluation (Signed)
Anesthesia Post Note  Patient: Angela [name]  Procedure(s) Performed: COLONOSCOPY WITH PROPOFOL BIOPSY     Patient location during evaluation: Endoscopy Anesthesia Type: MAC Level of consciousness: awake and alert Pain management: pain level controlled Vital Signs Assessment: post-procedure vital signs reviewed and stable Respiratory status: spontaneous breathing, nonlabored ventilation, respiratory function stable and patient connected to nasal cannula oxygen Cardiovascular status: stable and blood pressure returned to baseline Postop Assessment: no apparent nausea or vomiting Anesthetic complications: no   No notable events documented.  Last Vitals:  Vitals:   04/29/21 0940 04/29/21 0950  BP: 114/63 133/80  Pulse: 70 63  Resp: (!) 23 15  Temp:    SpO2: 98% 99%    Last Pain:  Vitals:   04/29/21 0932  TempSrc: Oral  PainSc: 0-No pain                 Belenda Cruise P Elery Cadenhead

## 2021-04-30 ENCOUNTER — Encounter (HOSPITAL_COMMUNITY): Payer: Self-pay | Admitting: Gastroenterology

## 2021-04-30 LAB — SURGICAL PATHOLOGY

## 2021-08-10 ENCOUNTER — Ambulatory Visit: Payer: 59 | Admitting: Gastroenterology

## 2021-09-16 ENCOUNTER — Telehealth: Payer: Self-pay | Admitting: Gastroenterology

## 2021-09-16 NOTE — Telephone Encounter (Signed)
Inbound call from patient. States she believe she have C. Diff. States she is having diarrhea 5/6 times a day, little fever, and exhausting. Asking if she can bring a stool sample tomorrow. Best contact number [phone]

## 2021-09-16 NOTE — Telephone Encounter (Signed)
The pt states that she has watery stools 5-7 times daily since about 2 weeks ago.  No recent abx use.  She states her temp is high for her 37 F.  She is very tired and has some right side abd pain.  She says she has had C diff in the past and this feels the same.  She was seen in June of this year for diarrhea. Please advise

## 2021-09-17 ENCOUNTER — Other Ambulatory Visit: Payer: Self-pay

## 2021-09-17 ENCOUNTER — Other Ambulatory Visit (INDEPENDENT_AMBULATORY_CARE_PROVIDER_SITE_OTHER): Payer: 59

## 2021-09-17 DIAGNOSIS — R197 Diarrhea, unspecified: Secondary | ICD-10-CM

## 2021-09-17 LAB — COMPREHENSIVE METABOLIC PANEL
ALT: 63 U/L — ABNORMAL HIGH (ref 0–35)
AST: 41 U/L — ABNORMAL HIGH (ref 0–37)
Albumin: 4.6 g/dL (ref 3.5–5.2)
Alkaline Phosphatase: 60 U/L (ref 39–117)
BUN: 17 mg/dL (ref 6–23)
CO2: 22 mEq/L (ref 19–32)
Calcium: 9.4 mg/dL (ref 8.4–10.5)
Chloride: 105 mEq/L (ref 96–112)
Creatinine, Ser: 1 mg/dL (ref 0.40–1.20)
GFR: 60.13 mL/min (ref >60.00)
Glucose, Bld: 145 mg/dL — ABNORMAL HIGH (ref 70–99)
Potassium: 4.5 mEq/L (ref 3.5–5.1)
Sodium: 139 mEq/L (ref 135–145)
Total Bilirubin: 0.4 mg/dL (ref 0.2–1.2)
Total Protein: 7.2 g/dL (ref 6.0–8.3)

## 2021-09-17 LAB — CBC WITH DIFFERENTIAL/PLATELET
Basophils Absolute: 0 10*3/uL (ref 0.0–0.1)
Basophils Relative: 0.3 % (ref 0.0–3.0)
Eosinophils Absolute: 0.3 10*3/uL (ref 0.0–0.7)
Eosinophils Relative: 4.9 % (ref 0.0–5.0)
HCT: 40.9 % (ref 36.0–46.0)
Hemoglobin: 13.6 g/dL (ref 12.0–15.0)
Lymphocytes Relative: 26.3 % (ref 12.0–46.0)
Lymphs Abs: 1.6 10*3/uL (ref 0.7–4.0)
MCHC: 33.4 g/dL (ref 30.0–36.0)
MCV: 88.5 fl (ref 78.0–100.0)
Monocytes Absolute: 0.3 10*3/uL (ref 0.1–1.0)
Monocytes Relative: 5.3 % (ref 3.0–12.0)
Neutro Abs: 3.7 10*3/uL (ref 1.4–7.7)
Neutrophils Relative %: 63.2 % (ref 43.0–77.0)
Platelets: 251 10*3/uL (ref 150.0–400.0)
RBC: 4.62 Mil/uL (ref 3.87–5.11)
RDW: 14.2 % (ref 11.5–15.5)
WBC: 5.9 10*3/uL (ref 4.0–10.5)

## 2021-09-17 LAB — HIGH SENSITIVITY CRP: CRP, High Sensitivity: 2.48 mg/L (ref 0.000–5.000)

## 2021-09-17 LAB — SEDIMENTATION RATE: Sed Rate: 9 mm/hr (ref 0–30)

## 2021-09-17 NOTE — Telephone Encounter (Signed)
The pt has been advised to come in for labs and stool test.  Orders entered

## 2021-09-17 NOTE — Addendum Note (Signed)
Addended by: Azzie Almas on: 09/17/2021 12:51 PM   Modules accepted: Orders

## 2021-09-20 ENCOUNTER — Other Ambulatory Visit: Payer: 59

## 2021-09-20 DIAGNOSIS — R197 Diarrhea, unspecified: Secondary | ICD-10-CM

## 2021-09-22 ENCOUNTER — Telehealth: Payer: Self-pay | Admitting: Gastroenterology

## 2021-09-22 ENCOUNTER — Other Ambulatory Visit: Payer: Self-pay

## 2021-09-22 LAB — GI PROFILE, STOOL, PCR
Adenovirus F 40/41: NOT DETECTED
Astrovirus: NOT DETECTED
C difficile toxin A/B: DETECTED — AB
Campylobacter: NOT DETECTED
Cryptosporidium: NOT DETECTED
Cyclospora cayetanensis: NOT DETECTED
Entamoeba histolytica: NOT DETECTED
Enteroaggregative E coli: NOT DETECTED
Enteropathogenic E coli: NOT DETECTED
Enterotoxigenic E coli: NOT DETECTED
Giardia lamblia: NOT DETECTED
Norovirus GI/GII: DETECTED — AB
Plesiomonas shigelloides: NOT DETECTED
Rotavirus A: NOT DETECTED
Salmonella: NOT DETECTED
Sapovirus: NOT DETECTED
Shiga-toxin-producing E coli: NOT DETECTED
Shigella/Enteroinvasive E coli: NOT DETECTED
Vibrio cholerae: NOT DETECTED
Vibrio: NOT DETECTED
Yersinia enterocolitica: NOT DETECTED

## 2021-09-22 MED ORDER — DIFICID 200 MG PO TABS: 200.0000 mg | ORAL_TABLET | Freq: Two times a day (BID) | ORAL | 0 refills | Status: AC

## 2021-09-22 NOTE — Telephone Encounter (Signed)
Responded to pt see 09/22/21 My Chart message dated 09/22/21.

## 2021-09-22 NOTE — Telephone Encounter (Signed)
Patient returned call. States [facility] did not have the prescription so it was transferred to [facility] in [place]. Requests a call back to make sure that is okay and If there is anything else that needs to be done.

## 2021-09-22 NOTE — Telephone Encounter (Signed)
Let her know c. Diff is positive again.  She needs dificid 282m po BID for 10 days.  OV with me or extender in 3-4 weeks.  thanks

## 2021-09-22 NOTE — Telephone Encounter (Signed)
See results note dated 09/22/21

## 2021-09-23 LAB — CALPROTECTIN, FECAL: Calprotectin, Fecal: 44 ug/g (ref 0–120)

## 2021-09-27 ENCOUNTER — Ambulatory Visit: Payer: 59 | Admitting: Gastroenterology

## 2021-10-02 LAB — HM MAMMOGRAPHY

## 2021-10-04 LAB — HM MAMMOGRAPHY

## 2021-10-04 NOTE — Telephone Encounter (Signed)
Sorry I routed to Dr Rush Landmark in error.

## 2021-10-05 ENCOUNTER — Other Ambulatory Visit: Payer: Self-pay

## 2021-10-05 MED ORDER — VANCOMYCIN HCL 125 MG PO CAPS: ORAL_CAPSULE | ORAL | 0 refills | Status: DC

## 2021-10-29 ENCOUNTER — Ambulatory Visit: Payer: 59 | Admitting: Gastroenterology

## 2021-12-07 ENCOUNTER — Encounter: Payer: Self-pay | Admitting: Gastroenterology

## 2021-12-07 ENCOUNTER — Other Ambulatory Visit (INDEPENDENT_AMBULATORY_CARE_PROVIDER_SITE_OTHER): Payer: 59

## 2021-12-07 ENCOUNTER — Other Ambulatory Visit: Payer: Self-pay

## 2021-12-07 ENCOUNTER — Ambulatory Visit (INDEPENDENT_AMBULATORY_CARE_PROVIDER_SITE_OTHER): Payer: 59 | Admitting: Gastroenterology

## 2021-12-07 VITALS — BP 150/80 | HR 84 | Ht 65.0 in | Wt 180.8 lb

## 2021-12-07 DIAGNOSIS — A0472 Enterocolitis due to Clostridium difficile, not specified as recurrent: Secondary | ICD-10-CM | POA: Diagnosis not present

## 2021-12-07 DIAGNOSIS — K50919 Crohn's disease, unspecified, with unspecified complications: Secondary | ICD-10-CM

## 2021-12-07 LAB — COMPREHENSIVE METABOLIC PANEL
ALT: 45 U/L — ABNORMAL HIGH (ref 0–35)
AST: 24 U/L (ref 0–37)
Albumin: 4.8 g/dL (ref 3.5–5.2)
Alkaline Phosphatase: 74 U/L (ref 39–117)
BUN: 18 mg/dL (ref 6–23)
CO2: 25 mEq/L (ref 19–32)
Calcium: 9.5 mg/dL (ref 8.4–10.5)
Chloride: 102 mEq/L (ref 96–112)
Creatinine, Ser: 1.03 mg/dL (ref 0.40–1.20)
GFR: 57.94 mL/min — ABNORMAL LOW (ref >60.00)
Glucose, Bld: 93 mg/dL (ref 70–99)
Potassium: 4.3 mEq/L (ref 3.5–5.1)
Sodium: 139 mEq/L (ref 135–145)
Total Bilirubin: 0.4 mg/dL (ref 0.2–1.2)
Total Protein: 7.5 g/dL (ref 6.0–8.3)

## 2021-12-07 LAB — CBC
HCT: 44.2 % (ref 36.0–46.0)
Hemoglobin: 14.4 g/dL (ref 12.0–15.0)
MCHC: 32.6 g/dL (ref 30.0–36.0)
MCV: 88.4 fl (ref 78.0–100.0)
Platelets: 248 10*3/uL (ref 150.0–400.0)
RBC: 5 Mil/uL (ref 3.87–5.11)
RDW: 13.6 % (ref 11.5–15.5)
WBC: 7 10*3/uL (ref 4.0–10.5)

## 2021-12-07 MED ORDER — CHOLESTYRAMINE 4 G PO PACK: 4.0000 g | PACK | Freq: Two times a day (BID) | ORAL | 3 refills | Status: DC

## 2021-12-07 MED ORDER — USTEKINUMAB 90 MG/ML ~~LOC~~ SOSY: 90.0000 mg | PREFILLED_SYRINGE | SUBCUTANEOUS | 6 refills | Status: DC

## 2021-12-07 NOTE — Patient Instructions (Addendum)
If you are age 64 or younger, your body mass index should be between 19-25. Your Body mass index is 30.09 kg/m. If this is out of the aformentioned range listed, please consider follow up with your Primary Care Provider.  ________________________________________________________  The [facility] providers would like to encourage you to use [facility] to communicate with providers for non-urgent requests or questions.  Due to long hold times on the telephone, sending your provider a message by [facility] may be a faster and more efficient way to get a response.  Please allow 48 business hours for a response.  Please remember that this is for non-urgent requests.  _______________________________________________________  Your provider has requested that you go to the basement level for lab work before leaving today. Press "B" on the elevator. The lab is located at the first door on the left as you exit the elevator.  Due to recent changes in healthcare laws, you may see the results of your imaging and laboratory studies on MyChart before your provider has had a chance to review them.  We understand that in some cases there may be results that are confusing or concerning to you. Not all laboratory results come back in the same time frame and the provider may be waiting for multiple results in order to interpret others.  Please give [place] 48 hours in order for your provider to thoroughly review all the results before contacting the office for clarification of your results.   CONTINUE: Stelara  We have sent the following medications to your pharmacy for you to pick up at your convenience:  START: cholestyramine 4gm twice daily.   Please call our office in 1 week to give an update of your symptoms.  Thank you for entrusting me with your care and choosing [facility].  Dr Ardis Hughs

## 2021-12-07 NOTE — Progress Notes (Signed)
1. Crohns ileocolitis: Diagnosed 2019 with a inflammatory process in the right lower quadrant involving the cecum and terminal ileum.  Eventual colonoscopy with Dr. Lizbeth Bark April 2019 showed "congested, red, pseudopolypoid and scar area in the ascending colon, the proximal ascending colon and the IC valve, congested mucosa in the distal sigmoid" endoscopically this was suspicious for Crohn's disease.  Pathology showed erosion, focal active cryptitis and mild crypt or distortion.  No granulomas.  She had a hyperplastic polyp removed.  [facility] IBD testing was "positive for Crohn's disease".  She was evaluated by Dr. Annye English at [facility] surgery he recommended second opinion GI and she transferred her care to [facility].  I did feel that she had Crohn's ileocolitis and started her on Humira June 2019.  Colonoscopy February 2021 Dr. Ardis Hughs TI was stenotic with a pinpoint lumen which could not be intubated..  Just distal to the IC valve there was a frond-like mucosa though suspicious for colocolonic fistula described on recent CT.  Right colon was normal-appearing, left colon was normal except for a 10 cm segment that appeared congested and edematous.  Pathology showed no active inflammation or chronic changes. Humira start June 2019: Significant side effects including extreme myalgias, double vision, significant injection site pain, flulike symptoms.  Changed to stelara 08/2018 TB quant gold negative June 2019 Hepatitis B surface antibody and antigen were -June 2019 Pneumococcal immunizations started June 2019 with Prevnar 13. Stelara trough drug level 2.1ug/mL, no circulating antibodies (blood work August 2020); began process of every 6 week frequency dosing change.  Started every 6 weeks Stelara October 2020.  Trough level January 2021 greater than 10 ug/mL Lap assisted ileocolonic resection, lysis of adhesion April 2021 Dr. 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  Biopsies from her neoterminal ileum were essentially normal, random biopsies from the colon were all normal. 2.  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   3.  C. difficile infection: Presented with urgency, frequency.  January 2022 Stelara antibodies negative, Stelara drug level appropriate (12).  Proven by toxin PCR January 2022, remainder of GI path panel was negative.  Treated with vancomycin 125 mg p.o. 4 times daily for 2 weeks.   Recurrent, untreated C. difficile positive again by PCR toxin March 2022; started dificid 200 BID for 10 days   HPI: This is a very pleasant 66 year old woman   I last saw her 7 or 8 months ago, June 2022.  She responded quickly to both vancomycin and then Dificid.  There was some concern that she had recurrent C. difficile and so a long tapering course of vancomycin was started.  At the time of her last visit she was moving her bowels 5-6 times a day.  She was on a tapering regimen of Vanco down to every other day.  I recommended getting a look at her colon with a colonoscopy at her soonest convenience and that she continue tapering off vancomycin.  See her colonoscopy result June 2022 above.  She started having watery stool again October 2022 very tired, some right-sided abdominal pain.  C. difficile testing was again positive and I put her back on Dificid 2-week course.  She continues to take her Stelara every 6 weeks without trouble.  She is again fearful that she might have C. difficile infection.  She  is having aches and pains on her right side and also on her left side lower in her belly.  She is having low-grade fevers that are quite bothersome.  They make her nauseous.  They wipe her out.  She has been on and off of Dificid, long  vancomycin courses.  It has been honestly confusing whether she truly has C. difficile associated diarrhea or she is just colonized    ROS: complete GI ROS as described in HPI, all other review negative.  Constitutional:  No unintentional weight loss   Past Medical History:  Diagnosis Date   C. difficile diarrhea    Crohn's colitis ([place])    Diabetes mellitus without complication ([place])    Family history of breast cancer    Hyperlipidemia    Hypertension    PONV (postoperative nausea and vomiting)     Past Surgical History:  Procedure Laterality Date   BIOPSY  04/29/2021   Procedure: BIOPSY;  Surgeon: Milus Banister, MD;  Location: [facility];  Service: Endoscopy;;   CESAREAN SECTION     x2   CHOLECYSTECTOMY  2011   COLONOSCOPY  12/2019   COLONOSCOPY WITH PROPOFOL N/A 04/29/2021   Procedure: COLONOSCOPY WITH PROPOFOL;  Surgeon: Milus Banister, MD;  Location: [facility];  Service: Endoscopy;  Laterality: N/A;   FLEXIBLE SIGMOIDOSCOPY  03/04/2020   Procedure: DIAGNOSTIC FLEXIBLE SIGMOIDOSCOPY;  Surgeon: Ileana Roup, MD;  Location: [facility];  Service: General;;   KNEE SURGERY Right    LAPAROSCOPIC SUBTOTAL COLECTOMY N/A 03/04/2020   Procedure: LAPAROSCOPIC ILEOCOLECTOMY, LYSIS OF ADHESIONS, BILATERAL TAP BLOCK;  Surgeon: Ileana Roup, MD;  Location: [facility];  Service: General;  Laterality: N/A;   SPINE SURGERY     Herniated Disc x 2   tummy tuck  2001    Current Outpatient Medications  Medication Sig Dispense Refill   atorvastatin (LIPITOR) 40 MG tablet Take 40 mg by mouth daily.     lisinopril (ZESTRIL) 10 MG tablet Take 10 mg by mouth daily.     metFORMIN (GLUCOPHAGE-XR) 500 MG 24 hr tablet Take 500 mg by mouth daily with breakfast.     ustekinumab (STELARA) 90 MG/ML SOSY injection Inject 1 mL (90 mg total) into the skin every 6 (six) weeks. 1 mL 6   No current facility-administered medications for this visit.    Allergies as of 12/07/2021 - Review  Complete 12/07/2021  Allergen Reaction Noted   Humira [adalimumab] Other (See Comments) 10/05/2018   Penicillins Hives 05/29/2014    Family History  Problem Relation Age of Onset   Breast cancer Mother    Esophageal cancer Father    Leukemia Sister 5   Breast cancer Maternal Aunt 13   Breast cancer Maternal Grandmother        dx in her late 87s   Heart attack Paternal Grandfather    Cervical cancer Sister    Colon cancer Neg Hx    Liver cancer Neg Hx    Stomach cancer Neg Hx     Social History   Socioeconomic History   Marital status: Married    Spouse name: Not on file   Number of children: 3   Years of education: 18   Highest education level: Not on file  Occupational History   Occupation: realtor  Tobacco Use   Smoking status: Former    Years: 20.00    Types: Cigarettes   Smokeless tobacco: Never  Vaping Use   Vaping Use: Never used  Substance  and Sexual Activity   Alcohol use: Yes    Comment: occasionally   Drug use: No   Sexual activity: Yes  Other Topics Concern   Not on file  Social History Narrative   Not on file   Social Determinants of Health   Financial Resource Strain: Not on file  Food Insecurity: Not on file  Transportation Needs: Not on file  Physical Activity: Not on file  Stress: Not on file  Social Connections: Not on file  Intimate Partner Violence: Not on file     Physical Exam: BP (!) 150/80    Pulse 84    Ht 5' 5"  (1.651 m)    Wt 180 lb 12.8 oz (82 kg)    BMI 30.09 kg/m  Constitutional: generally well-appearing Psychiatric: alert and oriented x3 Abdomen: soft, nontender, nondistended, no obvious ascites, no peritoneal signs, normal bowel sounds No peripheral edema noted in lower extremities  Assessment and plan: 66 y.o. female with Crohn's ileocolitis, history of C. difficile infection  She usually has 2-3 loose stools daily.  She has had C. difficile infection in the past and now it is difficult to know if she is just  colonized or if she truly has infection again.  I actually favor colonization.  I think possibly some of her chronic loose stool issues are from the fact that she is missing her IC valve since her Crohn's surgery.  I do not think she has active Crohn's certainly colonoscopy last year showed no sign of active Crohn's disease on Stelara every 6 weeks.  She has had low-grade fevers and some abdominal discomforts as well.  This is always been assigned to her that her C. difficile is back.  She is getting get a basic set of labs today including CBC and complete metabolic profile.  I am having her start cholestyramine 4 g 1 dose twice daily and I want her to call back in 1 week to let me know how she is doing.  If she has not noticed significant improvement then we will recheck C. difficile and pending that result I will likely send her to infectious disease for their input.  Please see the "Patient Instructions" section for addition details about the plan.  Owens Loffler, MD [facility] 12/07/2021, 11:28 AM   Total time on date of encounter was 25 minutes (this included time spent preparing to see the patient reviewing records; obtaining and/or reviewing separately obtained history; performing a medically appropriate exam and/or evaluation; counseling and educating the patient and family if present; ordering medications, tests or procedures if applicable; and documenting clinical information in the health record).

## 2021-12-20 ENCOUNTER — Encounter: Payer: Self-pay | Admitting: Gastroenterology

## 2021-12-21 ENCOUNTER — Other Ambulatory Visit: Payer: Self-pay | Admitting: Gastroenterology

## 2021-12-21 ENCOUNTER — Other Ambulatory Visit: Payer: Self-pay

## 2021-12-21 ENCOUNTER — Other Ambulatory Visit: Payer: 59

## 2021-12-21 DIAGNOSIS — A0472 Enterocolitis due to Clostridium difficile, not specified as recurrent: Secondary | ICD-10-CM

## 2021-12-21 DIAGNOSIS — R509 Fever, unspecified: Secondary | ICD-10-CM

## 2021-12-22 ENCOUNTER — Other Ambulatory Visit: Payer: 59

## 2021-12-22 DIAGNOSIS — A0472 Enterocolitis due to Clostridium difficile, not specified as recurrent: Secondary | ICD-10-CM

## 2021-12-24 ENCOUNTER — Encounter: Payer: Self-pay | Admitting: Gastroenterology

## 2021-12-24 LAB — CLOSTRIDIUM DIFFICILE EIA: C difficile Toxins A+B, EIA: NEGATIVE

## 2021-12-25 LAB — GI PROFILE, STOOL, PCR

## 2021-12-27 ENCOUNTER — Telehealth: Payer: Self-pay

## 2021-12-27 DIAGNOSIS — K50919 Crohn's disease, unspecified, with unspecified complications: Secondary | ICD-10-CM

## 2021-12-27 NOTE — Telephone Encounter (Signed)
I have entered the order and sent to the schedulers.  Stool test order entered. The pt has been advised of the information and will call if she has not heard from the schedulers in 1 week.

## 2021-12-27 NOTE — Telephone Encounter (Signed)
Angela Banister, MD  Timothy Lasso, RN I just spoke with her on the phone.  I explained that now that we know C. difficile is not playing a role we need to wonder if her Crohn's is actually active or not.   She needs MRI enterography of her abdomen and pelvis check for active bowel inflammation  She needs blood work with CRP, sed rate, CBC and complete metabolic profile  She needs stool testing with fecal calprotectin   I offered prednisone course to see if they can get her feeling better quickly but she wants to wait on that.  She will call if she gets worse however.   Thanks

## 2021-12-28 ENCOUNTER — Other Ambulatory Visit (INDEPENDENT_AMBULATORY_CARE_PROVIDER_SITE_OTHER): Payer: 59

## 2021-12-28 DIAGNOSIS — K50919 Crohn's disease, unspecified, with unspecified complications: Secondary | ICD-10-CM | POA: Diagnosis not present

## 2021-12-28 LAB — COMPREHENSIVE METABOLIC PANEL
ALT: 44 U/L — ABNORMAL HIGH (ref 0–35)
AST: 23 U/L (ref 0–37)
Albumin: 4.6 g/dL (ref 3.5–5.2)
Alkaline Phosphatase: 70 U/L (ref 39–117)
BUN: 18 mg/dL (ref 6–23)
CO2: 28 mEq/L (ref 19–32)
Calcium: 9.5 mg/dL (ref 8.4–10.5)
Chloride: 102 mEq/L (ref 96–112)
Creatinine, Ser: 1.13 mg/dL (ref 0.40–1.20)
GFR: 51.83 mL/min — ABNORMAL LOW (ref >60.00)
Glucose, Bld: 103 mg/dL — ABNORMAL HIGH (ref 70–99)
Potassium: 4 mEq/L (ref 3.5–5.1)
Sodium: 138 mEq/L (ref 135–145)
Total Bilirubin: 0.6 mg/dL (ref 0.2–1.2)
Total Protein: 7.2 g/dL (ref 6.0–8.3)

## 2021-12-28 LAB — CBC WITH DIFFERENTIAL/PLATELET
Basophils Absolute: 0.1 10*3/uL (ref 0.0–0.1)
Basophils Relative: 1.4 % (ref 0.0–3.0)
Eosinophils Absolute: 0.2 10*3/uL (ref 0.0–0.7)
Eosinophils Relative: 3.7 % (ref 0.0–5.0)
HCT: 43.9 % (ref 36.0–46.0)
Hemoglobin: 14.4 g/dL (ref 12.0–15.0)
Lymphocytes Relative: 27 % (ref 12.0–46.0)
Lymphs Abs: 1.6 10*3/uL (ref 0.7–4.0)
MCHC: 32.7 g/dL (ref 30.0–36.0)
MCV: 87.5 fl (ref 78.0–100.0)
Monocytes Absolute: 0.4 10*3/uL (ref 0.1–1.0)
Monocytes Relative: 5.8 % (ref 3.0–12.0)
Neutro Abs: 3.8 10*3/uL (ref 1.4–7.7)
Neutrophils Relative %: 62.1 % (ref 43.0–77.0)
Platelets: 266 10*3/uL (ref 150.0–400.0)
RBC: 5.02 Mil/uL (ref 3.87–5.11)
RDW: 13.4 % (ref 11.5–15.5)
WBC: 6.1 10*3/uL (ref 4.0–10.5)

## 2021-12-28 LAB — SEDIMENTATION RATE: Sed Rate: 6 mm/hr (ref 0–30)

## 2021-12-28 LAB — HIGH SENSITIVITY CRP: CRP, High Sensitivity: 1.41 mg/L (ref 0.000–5.000)

## 2021-12-29 ENCOUNTER — Other Ambulatory Visit: Payer: Self-pay

## 2021-12-29 MED ORDER — DIAZEPAM 5 MG PO TABS: 5.0000 mg | ORAL_TABLET | ORAL | 0 refills | Status: DC | PRN

## 2021-12-29 MED ORDER — COLESTIPOL HCL 1 G PO TABS: 2.0000 g | ORAL_TABLET | Freq: Two times a day (BID) | ORAL | 3 refills | Status: DC

## 2021-12-30 ENCOUNTER — Other Ambulatory Visit: Payer: 59

## 2021-12-30 DIAGNOSIS — K50919 Crohn's disease, unspecified, with unspecified complications: Secondary | ICD-10-CM

## 2021-12-31 ENCOUNTER — Encounter: Payer: Self-pay | Admitting: Gastroenterology

## 2022-01-04 LAB — CALPROTECTIN, FECAL: Calprotectin, Fecal: 44 ug/g (ref 0–120)

## 2022-01-05 ENCOUNTER — Other Ambulatory Visit: Payer: Self-pay | Admitting: Neurosurgery

## 2022-01-05 DIAGNOSIS — M961 Postlaminectomy syndrome, not elsewhere classified: Secondary | ICD-10-CM

## 2022-01-10 ENCOUNTER — Encounter (HOSPITAL_COMMUNITY): Payer: Self-pay

## 2022-01-10 ENCOUNTER — Ambulatory Visit (HOSPITAL_COMMUNITY): Payer: 59

## 2022-01-10 ENCOUNTER — Other Ambulatory Visit: Payer: Self-pay

## 2022-01-10 ENCOUNTER — Ambulatory Visit (HOSPITAL_COMMUNITY)
Admission: RE | Admit: 2022-01-10 | Discharge: 2022-01-10 | Disposition: A | Discharge status: Home / Self Care | Payer: 59 | Source: Ambulatory Visit | Attending: Gastroenterology | Admitting: Gastroenterology

## 2022-01-10 DIAGNOSIS — K50919 Crohn's disease, unspecified, with unspecified complications: Secondary | ICD-10-CM | POA: Insufficient documentation

## 2022-01-10 MED ORDER — GADOBUTROL 1 MMOL/ML IV SOLN
8.0000 mL | Freq: Once | INTRAVENOUS | Status: AC | PRN
  Administered 2022-01-10: 8 mL via INTRAVENOUS

## 2022-01-27 ENCOUNTER — Other Ambulatory Visit: Payer: Self-pay

## 2022-01-27 ENCOUNTER — Ambulatory Visit
Admission: RE | Admit: 2022-01-27 | Discharge: 2022-01-27 | Disposition: A | Discharge status: Home / Self Care | Payer: 59 | Source: Ambulatory Visit | Attending: Neurosurgery | Admitting: Neurosurgery

## 2022-01-27 DIAGNOSIS — M961 Postlaminectomy syndrome, not elsewhere classified: Secondary | ICD-10-CM

## 2022-03-06 ENCOUNTER — Encounter: Payer: Self-pay | Admitting: Gastroenterology

## 2022-03-06 DIAGNOSIS — R197 Diarrhea, unspecified: Secondary | ICD-10-CM

## 2022-03-06 DIAGNOSIS — A0472 Enterocolitis due to Clostridium difficile, not specified as recurrent: Secondary | ICD-10-CM

## 2022-03-07 ENCOUNTER — Other Ambulatory Visit (INDEPENDENT_AMBULATORY_CARE_PROVIDER_SITE_OTHER): Payer: 59

## 2022-03-07 DIAGNOSIS — A0472 Enterocolitis due to Clostridium difficile, not specified as recurrent: Secondary | ICD-10-CM

## 2022-03-07 DIAGNOSIS — R197 Diarrhea, unspecified: Secondary | ICD-10-CM | POA: Diagnosis not present

## 2022-03-07 LAB — CBC WITH DIFFERENTIAL/PLATELET
Basophils Absolute: 0 10*3/uL (ref 0.0–0.1)
Basophils Relative: 0.5 % (ref 0.0–3.0)
Eosinophils Absolute: 0.2 10*3/uL (ref 0.0–0.7)
Eosinophils Relative: 2.9 % (ref 0.0–5.0)
HCT: 44.1 % (ref 36.0–46.0)
Hemoglobin: 14.8 g/dL (ref 12.0–15.0)
Lymphocytes Relative: 22.2 % (ref 12.0–46.0)
Lymphs Abs: 1.7 10*3/uL (ref 0.7–4.0)
MCHC: 33.6 g/dL (ref 30.0–36.0)
MCV: 88.6 fl (ref 78.0–100.0)
Monocytes Absolute: 0.4 10*3/uL (ref 0.1–1.0)
Monocytes Relative: 5.4 % (ref 3.0–12.0)
Neutro Abs: 5.4 10*3/uL (ref 1.4–7.7)
Neutrophils Relative %: 69 % (ref 43.0–77.0)
Platelets: 269 10*3/uL (ref 150.0–400.0)
RBC: 4.97 Mil/uL (ref 3.87–5.11)
RDW: 14 % (ref 11.5–15.5)
WBC: 7.8 10*3/uL (ref 4.0–10.5)

## 2022-03-07 LAB — BASIC METABOLIC PANEL
BUN: 19 mg/dL (ref 6–23)
CO2: 25 mEq/L (ref 19–32)
Calcium: 9.9 mg/dL (ref 8.4–10.5)
Chloride: 102 mEq/L (ref 96–112)
Creatinine, Ser: 1.15 mg/dL (ref 0.40–1.20)
GFR: 50.68 mL/min — ABNORMAL LOW (ref >60.00)
Glucose, Bld: 95 mg/dL (ref 70–99)
Potassium: 4.3 mEq/L (ref 3.5–5.1)
Sodium: 140 mEq/L (ref 135–145)

## 2022-03-07 LAB — SEDIMENTATION RATE: Sed Rate: 7 mm/hr (ref 0–30)

## 2022-03-07 LAB — HIGH SENSITIVITY CRP: CRP, High Sensitivity: 1.22 mg/L (ref 0.000–5.000)

## 2022-03-07 NOTE — Addendum Note (Signed)
Addended by: Timothy Lasso on: 03/07/2022 12:57 PM ? ? Modules accepted: Orders ? ?

## 2022-03-07 NOTE — Telephone Encounter (Signed)
Dr Ardis Hughs see the note from the pt.  Please advise.  ?

## 2022-03-08 ENCOUNTER — Other Ambulatory Visit: Payer: 59

## 2022-03-08 DIAGNOSIS — R197 Diarrhea, unspecified: Secondary | ICD-10-CM

## 2022-03-08 DIAGNOSIS — A0472 Enterocolitis due to Clostridium difficile, not specified as recurrent: Secondary | ICD-10-CM

## 2022-03-10 LAB — GI PROFILE, STOOL, PCR

## 2022-03-14 ENCOUNTER — Telehealth: Payer: Self-pay | Admitting: Gastroenterology

## 2022-03-14 NOTE — Telephone Encounter (Signed)
Noted  

## 2022-03-14 NOTE — Telephone Encounter (Signed)
Patient called to cancel her 5/1 f/u appt with Dr. Ardis Hughs.  She has an appointment with her surgeon and is not sure, at this point, if she needs the appt with Dr. Ardis Hughs or not.  She is going to decide after she sees the surgeon but wanted to make you aware. ?

## 2022-03-21 ENCOUNTER — Ambulatory Visit: Payer: 59 | Admitting: Gastroenterology

## 2022-03-21 DIAGNOSIS — N183 Chronic kidney disease, stage 3 unspecified: Secondary | ICD-10-CM | POA: Insufficient documentation

## 2022-03-21 DIAGNOSIS — R1115 Cyclical vomiting syndrome unrelated to migraine: Secondary | ICD-10-CM

## 2022-03-21 DIAGNOSIS — T819XXA Unspecified complication of procedure, initial encounter: Secondary | ICD-10-CM | POA: Insufficient documentation

## 2022-03-21 DIAGNOSIS — M961 Postlaminectomy syndrome, not elsewhere classified: Secondary | ICD-10-CM | POA: Insufficient documentation

## 2022-03-21 DIAGNOSIS — I700 Atherosclerosis of aorta: Secondary | ICD-10-CM | POA: Insufficient documentation

## 2022-03-21 DIAGNOSIS — F4320 Adjustment disorder, unspecified: Secondary | ICD-10-CM | POA: Insufficient documentation

## 2022-03-21 DIAGNOSIS — E1121 Type 2 diabetes mellitus with diabetic nephropathy: Secondary | ICD-10-CM | POA: Insufficient documentation

## 2022-03-21 HISTORY — DX: Cyclical vomiting syndrome unrelated to migraine: R11.15

## 2022-03-29 ENCOUNTER — Telehealth: Payer: Self-pay

## 2022-03-29 DIAGNOSIS — K50919 Crohn's disease, unspecified, with unspecified complications: Secondary | ICD-10-CM

## 2022-03-29 NOTE — Telephone Encounter (Signed)
I have sent the information to the pt via My Chart. (Pt preference) I have also entered the lab and made appt for 7/5 at 8:50 am with Dr Ardis Hughs.   ?

## 2022-03-29 NOTE — Telephone Encounter (Signed)
-----   Message from Milus Banister, MD sent at 03/29/2022  7:30 AM EDT ----- ?Regarding: RE: Follow-up ?Gerald Stabs, ?Just catching up on this. Thanks for your input as always.  I"m going to try adding immunomodulator (azathiaprine) and see if that changes these episodes. ? ?Alitza Cowman, ?Please contact her.  Let her know that Dr. Dema Severin and I have communicated and I think it is reasonable to try treating what might be "smoldering" inflammation that Stelara is not capturing.  I would like to start her on azathioprine but we need to check TPMT enzyme activity level first, can you have her come in for that lab?  Also lets get ov with me on the books for 7-8 weeks from now. ? ?Thanks all. ? ? ?----- Message ----- ?From: Ileana Roup, MD ?Sent: 03/21/2022   3:19 PM EDT ?To: Milus Banister, MD ?Subject: Follow-up                                     ? ?Melissa Montane -  ? ?Hope you are doing well. ? ?I saw Ms. Dinan in follow-up today, finally. A bit unusual of a case. I don't think it's adhesive or partially obstructive per se. She has these breakthrough "flares" where she has RLQ, occasionally LLQ, pains associated with temps in the 99 range, generalized malaise, fatigue and diarrhea. She notices this stuff occurring more towards the end of her 6 wk period between Stelara treatments... and believes it is identical to when she has previously had Crohn flares. I suspect it's low grade smoldering disease as opposed to anything more. Other things we had thought about would be kidney stones (!) - following removal of terminal ileum there is a higher chance of developing calcium oxalate stones... or diverticulitis but doubt that is the case in the absence of diverticulosis on her most recent scope and scan. None the less, her symptoms do not appear to be anything like when she was off therapy and she was overall happy with how things have gone. ? ?Let me know if you had other thoughts or concerns... happy to help. ? ?Gerald Stabs ? ? ? ? ?

## 2022-03-30 ENCOUNTER — Other Ambulatory Visit: Payer: 59

## 2022-03-30 DIAGNOSIS — K50919 Crohn's disease, unspecified, with unspecified complications: Secondary | ICD-10-CM

## 2022-04-13 LAB — THIOPURINE METHYLTRANSFERASE (TPMT), RBC: Thiopurine Methyltransferase, RBC: 13 nmol/hr/mL RBC

## 2022-04-14 ENCOUNTER — Other Ambulatory Visit: Payer: Self-pay

## 2022-04-14 DIAGNOSIS — K50919 Crohn's disease, unspecified, with unspecified complications: Secondary | ICD-10-CM

## 2022-04-14 MED ORDER — AZATHIOPRINE 100 MG PO TABS: 200.0000 mg | ORAL_TABLET | Freq: Every day | ORAL | 3 refills | Status: DC

## 2022-04-19 LAB — MICROALBUMIN / CREATININE URINE RATIO: Microalb Creat Ratio: 3

## 2022-05-02 ENCOUNTER — Other Ambulatory Visit (INDEPENDENT_AMBULATORY_CARE_PROVIDER_SITE_OTHER): Payer: 59

## 2022-05-02 DIAGNOSIS — K50919 Crohn's disease, unspecified, with unspecified complications: Secondary | ICD-10-CM

## 2022-05-02 LAB — CBC WITH DIFFERENTIAL/PLATELET
Basophils Absolute: 0.1 10*3/uL (ref 0.0–0.1)
Basophils Relative: 1.6 % (ref 0.0–3.0)
Eosinophils Absolute: 0.2 10*3/uL (ref 0.0–0.7)
Eosinophils Relative: 3.7 % (ref 0.0–5.0)
HCT: 41.5 % (ref 36.0–46.0)
Hemoglobin: 13.8 g/dL (ref 12.0–15.0)
Lymphocytes Relative: 27 % (ref 12.0–46.0)
Lymphs Abs: 1.4 10*3/uL (ref 0.7–4.0)
MCHC: 33.2 g/dL (ref 30.0–36.0)
MCV: 90.5 fl (ref 78.0–100.0)
Monocytes Absolute: 0.3 10*3/uL (ref 0.1–1.0)
Monocytes Relative: 5.6 % (ref 3.0–12.0)
Neutro Abs: 3.2 10*3/uL (ref 1.4–7.7)
Neutrophils Relative %: 62.1 % (ref 43.0–77.0)
Platelets: 294 10*3/uL (ref 150.0–400.0)
RBC: 4.58 Mil/uL (ref 3.87–5.11)
RDW: 13.5 % (ref 11.5–15.5)
WBC: 5.1 10*3/uL (ref 4.0–10.5)

## 2022-05-02 LAB — COMPREHENSIVE METABOLIC PANEL
ALT: 99 U/L — ABNORMAL HIGH (ref 0–35)
AST: 59 U/L — ABNORMAL HIGH (ref 0–37)
Albumin: 4.4 g/dL (ref 3.5–5.2)
Alkaline Phosphatase: 61 U/L (ref 39–117)
BUN: 12 mg/dL (ref 6–23)
CO2: 30 mEq/L (ref 19–32)
Calcium: 9.8 mg/dL (ref 8.4–10.5)
Chloride: 101 mEq/L (ref 96–112)
Creatinine, Ser: 0.87 mg/dL (ref 0.40–1.20)
GFR: 70.76 mL/min (ref >60.00)
Glucose, Bld: 117 mg/dL — ABNORMAL HIGH (ref 70–99)
Potassium: 4.2 mEq/L (ref 3.5–5.1)
Sodium: 141 mEq/L (ref 135–145)
Total Bilirubin: 0.7 mg/dL (ref 0.2–1.2)
Total Protein: 6.9 g/dL (ref 6.0–8.3)

## 2022-05-04 ENCOUNTER — Encounter: Payer: Self-pay | Admitting: Gastroenterology

## 2022-05-05 ENCOUNTER — Ambulatory Visit (INDEPENDENT_AMBULATORY_CARE_PROVIDER_SITE_OTHER): Payer: 59 | Admitting: Neurology

## 2022-05-05 ENCOUNTER — Encounter: Payer: Self-pay | Admitting: Neurology

## 2022-05-05 VITALS — BP 146/80 | HR 86 | Ht 65.0 in | Wt 172.0 lb

## 2022-05-05 DIAGNOSIS — H532 Diplopia: Secondary | ICD-10-CM | POA: Insufficient documentation

## 2022-05-05 DIAGNOSIS — R519 Headache, unspecified: Secondary | ICD-10-CM | POA: Insufficient documentation

## 2022-05-05 HISTORY — DX: Headache, unspecified: R51.9

## 2022-05-05 HISTORY — DX: Diplopia: H53.2

## 2022-05-05 NOTE — Progress Notes (Signed)
Chief Complaint  Patient presents with   New Patient (Initial Visit)    Rm 15. Alone. NP/Paper/[facility]/Jason Delman Cheadle OD/dipolpia r/o mysathenia gravis, thyroid, MS.      ASSESSMENT AND PLAN  Angela [name] is a 66 y.o. female   Intermittent diplopia, blurry vision Increased headache  Mostly far vision, suggestive of possible lateral rectus muscle involvement, no bulbar or limb muscle weakness  MRI of the brain with without contrast to rule out structural abnormality  Thyroid panel, acetylcholine receptor binding antibody  Will call for report, continue observe symptoms  Return to clinic for worsening symptoms    DIAGNOSTIC DATA (LABS, IMAGING, TESTING) - I reviewed patient records, labs, notes, testing and imaging myself where available.   MEDICAL HISTORY:  Angela [name] is a 66 year old female, seen in request by her ophthalmologist Dr.Gould, Corene Cornea, for evaluation of, intermittent double vision, initial evaluation was on May 05, 2022  I reviewed and summarized the referring note. PMHX. HLD HTN DM Crohn's disease  Since 2021 she noticed intermittent double vision, mostly happens regarding morning, looking for distance, improved after few blinks, lasting for few minutes, she did not check to see if it is binocular double vision, during the spells, she noticed blurry vision, imaging seems to be side-by-side, never up-and-down  She denies bulbar weakness, no limb muscle weakness,  She has been treated with Crohn's disease, under suboptimal control with Stelara injection alone, recently added on Imuran 200 mg daily  PHYSICAL EXAM:   Vitals:   05/05/22 1520  BP: (!) 146/80  Pulse: 86  Weight: 172 lb (78 kg)  Height: 5' 5"  (1.651 m)     Body mass index is 28.62 kg/m.  PHYSICAL EXAMNIATION:  Gen: NAD, conversant, well nourised, well groomed                     Cardiovascular: Regular rate rhythm, no peripheral edema, warm, nontender. Eyes:  Conjunctivae clear without exudates or hemorrhage Neck: Supple, no carotid bruits. Pulmonary: Clear to auscultation bilaterally   NEUROLOGICAL EXAM:  MENTAL STATUS: Speech/cognition: Awake, alert, oriented to history taking and casual conversation CRANIAL NERVES: CN II: Visual fields are full to confrontation. Pupils are round equal and briskly reactive to light. CN III, IV, VI: extraocular movement are normal. No ptosis. CN V: Facial sensation is intact to light touch CN VII: Face is symmetric with normal eye closure  CN VIII: Hearing is normal to causal conversation. CN IX, X: Phonation is normal. CN XI: Head turning and shoulder shrug are intact  MOTOR: There is no pronator drift of out-stretched arms. Muscle bulk and tone are normal. Muscle strength is normal.  REFLEXES: Reflexes are 2+ and symmetric at the biceps, triceps, knees, and ankles. Plantar responses are flexor.  SENSORY: Intact to light touch, pinprick and vibratory sensation are intact in fingers and toes.  COORDINATION: There is no trunk or limb dysmetria noted.  GAIT/STANCE: Posture is normal. Gait is steady with normal steps, base, arm swing, and turning. Heel and toe walking are normal. Tandem gait is normal.  Romberg is absent.  REVIEW OF SYSTEMS:  Full 14 system review of systems performed and notable only for as above All other review of systems were negative.   ALLERGIES: Allergies  Allergen Reactions   Humira [Adalimumab] Other (See Comments)    Joints aches/double vision/flu-like symptoms.   Penicillins Hives    Has patient had a PCN reaction causing immediate rash, facial/tongue/throat swelling, SOB or lightheadedness with  hypotension: Yes Has patient had a PCN reaction causing severe rash involving mucus membranes or skin necrosis: No Has patient had a PCN reaction that required hospitalization: No Has patient had a PCN reaction occurring within the last 10 years: No If all of the above  answers are "NO", then may proceed with Cephalosporin use.     HOME MEDICATIONS: Current Outpatient Medications  Medication Sig Dispense Refill   atorvastatin (LIPITOR) 40 MG tablet Take 40 mg by mouth daily.     azathioprine (IMURAN) 100 MG tablet Take 2 tablets (200 mg total) by mouth daily. 240 tablet 3   lisinopril (ZESTRIL) 10 MG tablet Take 10 mg by mouth daily.     metFORMIN (GLUCOPHAGE-XR) 500 MG 24 hr tablet Take 500 mg by mouth daily with breakfast.     ustekinumab (STELARA) 90 MG/ML SOSY injection Inject 1 mL (90 mg total) into the skin every 6 (six) weeks. 1 mL 6   colestipol (COLESTID) 1 g tablet Take 2 tablets (2 g total) by mouth 2 (two) times daily. 120 tablet 3   No current facility-administered medications for this visit.    PAST MEDICAL HISTORY: Past Medical History:  Diagnosis Date   C. difficile diarrhea    Crohn's colitis ([place])    Diabetes mellitus without complication ([place])    Family history of breast cancer    Hyperlipidemia    Hypertension    PONV (postoperative nausea and vomiting)     PAST SURGICAL HISTORY: Past Surgical History:  Procedure Laterality Date   BIOPSY  04/29/2021   Procedure: BIOPSY;  Surgeon: Milus Banister, MD;  [facility]: [facility];  Service: Endoscopy;;   CESAREAN SECTION     x2   CHOLECYSTECTOMY  2011   COLONOSCOPY  12/2019   COLONOSCOPY WITH PROPOFOL N/A 04/29/2021   Procedure: COLONOSCOPY WITH PROPOFOL;  Surgeon: Milus Banister, MD;  [facility];  Service: Endoscopy;  Laterality: N/A;   FLEXIBLE SIGMOIDOSCOPY  03/04/2020   Procedure: DIAGNOSTIC FLEXIBLE SIGMOIDOSCOPY;  Surgeon: Ileana Roup, MD;  Location: [facility];  Service: General;;   KNEE SURGERY Right    LAPAROSCOPIC SUBTOTAL COLECTOMY N/A 03/04/2020   Procedure: LAPAROSCOPIC ILEOCOLECTOMY, LYSIS OF ADHESIONS, BILATERAL TAP BLOCK;  Surgeon: Ileana Roup, MD;  Location: [facility];  Service: General;  Laterality: N/A;   SPINE SURGERY      Herniated Disc x 2   tummy tuck  2001    FAMILY HISTORY: Family History  Problem Relation Age of Onset   Breast cancer Mother    Esophageal cancer Father    Leukemia Sister 5   Breast cancer Maternal Aunt 35   Breast cancer Maternal Grandmother        dx in her late 43s   Heart attack Paternal Grandfather    Cervical cancer Sister    Colon cancer Neg Hx    Liver cancer Neg Hx    Stomach cancer Neg Hx     SOCIAL HISTORY: Social History   Socioeconomic History   Marital status: Married    Spouse name: Not on file   Number of children: 3   Years of education: 18   Highest education level: Not on file  Occupational History   Occupation: realtor  Tobacco Use   Smoking status: Former    Years: 20.00    Types: Cigarettes   Smokeless tobacco: Never  Vaping Use   Vaping Use: Never used  Substance and Sexual Activity   Alcohol use: Yes  Comment: occasionally   Drug use: No   Sexual activity: Yes  Other Topics Concern   Not on file  Social History Narrative   Not on file   Social Determinants of Health   Financial Resource Strain: Not on file  Food Insecurity: Not on file  Transportation Needs: Not on file  Physical Activity: Not on file  Stress: Not on file  Social Connections: Not on file  Intimate Partner Violence: Not on file      Marcial Pacas, M.D. Ph.D.  [facility] [address], [place], [place] [address] Ph: [phone] Fax: [phone]  CC:  Melissa Noon, OD [address],  [place] [address]  No primary care provider on file.

## 2022-05-06 ENCOUNTER — Telehealth: Payer: Self-pay | Admitting: Neurology

## 2022-05-06 LAB — ACETYLCHOLINE RECEPTOR, BINDING: AChR Binding Ab, Serum: <0.03 nmol/L (ref 0.00–0.24)

## 2022-05-06 LAB — THYROID PANEL WITH TSH
Free Thyroxine Index: 3.3 (ref 1.2–4.9)
T3 Uptake Ratio: 29 % (ref 24–39)
T4, Total: 11.4 ug/dL (ref 4.5–12.0)
TSH: 1.25 u[IU]/mL (ref 0.450–4.500)

## 2022-05-06 NOTE — Telephone Encounter (Signed)
[facility] Case Number: [id number] sent to GI they will call the patient to schedule

## 2022-05-10 ENCOUNTER — Other Ambulatory Visit: Payer: Self-pay

## 2022-05-10 DIAGNOSIS — R197 Diarrhea, unspecified: Secondary | ICD-10-CM

## 2022-05-10 DIAGNOSIS — K50919 Crohn's disease, unspecified, with unspecified complications: Secondary | ICD-10-CM

## 2022-05-23 ENCOUNTER — Other Ambulatory Visit (INDEPENDENT_AMBULATORY_CARE_PROVIDER_SITE_OTHER): Payer: 59

## 2022-05-23 ENCOUNTER — Telehealth: Payer: Self-pay | Admitting: Neurology

## 2022-05-23 DIAGNOSIS — K50919 Crohn's disease, unspecified, with unspecified complications: Secondary | ICD-10-CM

## 2022-05-23 HISTORY — PX: OTHER SURGICAL HISTORY: SHX169

## 2022-05-23 LAB — HEPATIC FUNCTION PANEL
ALT: 218 U/L — ABNORMAL HIGH (ref 0–35)
AST: 96 U/L — ABNORMAL HIGH (ref 0–37)
Albumin: 4.6 g/dL (ref 3.5–5.2)
Alkaline Phosphatase: 92 U/L (ref 39–117)
Bilirubin, Direct: 0.3 mg/dL (ref 0.0–0.3)
Total Bilirubin: 1.2 mg/dL (ref 0.2–1.2)
Total Protein: 7.1 g/dL (ref 6.0–8.3)

## 2022-05-23 NOTE — Telephone Encounter (Signed)
I called [employer] to see if Angela [name] was required for MRI [id number] per rep Wille Glaser reference # [id number], PA is not required.

## 2022-05-25 ENCOUNTER — Ambulatory Visit: Payer: 59

## 2022-05-25 ENCOUNTER — Ambulatory Visit (INDEPENDENT_AMBULATORY_CARE_PROVIDER_SITE_OTHER): Payer: 59 | Admitting: Gastroenterology

## 2022-05-25 ENCOUNTER — Encounter: Payer: Self-pay | Admitting: Gastroenterology

## 2022-05-25 VITALS — BP 136/86 | HR 72 | Ht 65.0 in | Wt 169.5 lb

## 2022-05-25 DIAGNOSIS — K50819 Crohn's disease of both small and large intestine with unspecified complications: Secondary | ICD-10-CM | POA: Diagnosis not present

## 2022-05-25 NOTE — Patient Instructions (Addendum)
If you are age 64 or younger, your body mass index should be between 19-25. Your Body mass index is 28.21 kg/m. If this is out of the aformentioned range listed, please consider follow up with your Primary Care Provider.  ________________________________________________________  The [place] [facility] providers would like to encourage you to use [facility] to communicate with providers for non-urgent requests or questions.  Due to long hold times on the telephone, sending your provider a message by [facility] may be a faster and more efficient way to get a response.  Please allow 48 business hours for a response.  Please remember that this is for non-urgent requests.  _______________________________________________________  DISCONTINUE azathioprine  You will need repeat LFT's in 2 weeks (06-08-22).  You will not need an appointment.  The lab is open Mon-Fri 8am-5pm.  We have referred you to Dr Casey Burkitt with [facility].  Please contact our office if you have not heard from his office in 2 to 3 weeks.  You will need a follow up appointment with our office in 4 months (November 2023).  We will contact you to schedule this appointment.   Thank you for entrusting me with your care and choosing [facility].  Dr Ardis Hughs

## 2022-05-25 NOTE — Progress Notes (Signed)
1. Crohns ileocolitis: Diagnosed 2019 with a inflammatory process in the right lower quadrant involving the cecum and terminal ileum.  Eventual colonoscopy with Dr. Lizbeth Bark April 2019 showed "congested, red, pseudopolypoid and scar area in the ascending colon, the proximal ascending colon and the IC valve, congested mucosa in the distal sigmoid" endoscopically this was suspicious for Crohn's disease.  Pathology showed erosion, focal active cryptitis and mild crypt or distortion.  No granulomas.  She had a hyperplastic polyp removed.  [facility] IBD testing was "positive for Crohn's disease".  She was evaluated by Dr. Annye English at [facility] surgery he recommended second opinion GI and she transferred her care to [facility].  I did feel that she had Crohn's ileocolitis and started her on Humira June 2019.  Colonoscopy February 2021 Dr. Ardis Hughs TI was stenotic with a pinpoint lumen which could not be intubated..  Just distal to the IC valve there was a frond-like mucosa though suspicious for colocolonic fistula described on recent CT.  Right colon was normal-appearing, left colon was normal except for a 10 cm segment that appeared congested and edematous.  Pathology showed no active inflammation or chronic changes. Humira start June 2019: Significant side effects including extreme myalgias, double vision, significant injection site pain, flulike symptoms.  Changed to stelara 08/2018 TB quant gold negative June 2019 Hepatitis B surface antibody and antigen were -June 2019 Pneumococcal immunizations started June 2019 with Prevnar 13. Stelara trough drug level 2.1ug/mL, no circulating antibodies (blood work August 2020); began process of every 6 week frequency dosing change.  Started every 6 weeks Stelara October 2020.  Trough level January 2021 greater than 10 ug/mL Lap assisted ileocolonic resection, lysis of adhesion April 2021 Dr. 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  Biopsies from her neoterminal ileum were essentially normal, random biopsies from the colon were all normal. MR enterography 12/2021 no sign of active inflammation, also sed rate, CRP, fecal calprotectin were all normal, GI pathogen was normal.  Multidisciplinary care with Dr. Dema Severin: We decided to try increasing her Crohn's therapy by adding immune modulator azathioprine.  TPMT enzyme activity was normal Transaminases elevated shortly after starting her azathioprine.  Stopped this medicine July 2023 2.  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  3.  C. difficile infection: Presented with urgency, frequency.  January 2022 Stelara antibodies negative, Stelara drug level appropriate (12).  Proven by toxin PCR January 2022, remainder of GI path panel was negative.  Treated with vancomycin 125 mg p.o. 4 times daily for 2 weeks.   Recurrent, untreated C. difficile positive again by PCR toxin March 2022; started dificid 200 BID for 10 days      HPI: This is a very pleasant 66 year old woman whom I last saw several months ago.  She has been on Stelara and more recently azathioprine was started given the possibility that some of her intermittent symptoms might be related to inflammatory Crohn's disease.  This is despite the fact that no inflammation was seen on MR enterography and no inflammatory markers were positive the past several months.  Azathioprine seems to be irritating her liver based on rising LFTs.  She is going to stop this medicine.  She explains that she has events about once a week.  These are fevers, generalized abdominal discomforts, looser than usual stools.  This last for 24 hours.  She is wiped out and really just has to lay in bed for this 24 hours.  Then she is back  to normal.  She has no abdominal pains, she has a bowel movement once daily.  Starting azathioprine did not alter this pattern.   ROS: complete GI ROS as described in HPI, all other review negative.  Constitutional:  No unintentional weight loss   Past Medical History:  Diagnosis Date   C. difficile diarrhea    Crohn's colitis ([place])    Diabetes mellitus without complication ([place])    Family history of breast cancer    Hyperlipidemia    Hypertension    PONV (postoperative nausea and vomiting)     Past Surgical History:  Procedure Laterality Date   BIOPSY  04/29/2021   Procedure: BIOPSY;  Surgeon: Milus Banister, MD;  [facility];  Service: Endoscopy;;   CESAREAN SECTION     x2   CHOLECYSTECTOMY  2011   COLONOSCOPY  12/2019   COLONOSCOPY WITH PROPOFOL N/A 04/29/2021   Procedure: COLONOSCOPY WITH PROPOFOL;  Surgeon: Milus Banister, MD;  [facility];  Service: Endoscopy;  Laterality: N/A;   FLEXIBLE SIGMOIDOSCOPY  03/04/2020   Procedure: DIAGNOSTIC FLEXIBLE SIGMOIDOSCOPY;  Surgeon: Ileana Roup, MD;  [facility];  Service: General;;   KNEE SURGERY Right    LAPAROSCOPIC SUBTOTAL COLECTOMY N/A 03/04/2020   Procedure: LAPAROSCOPIC ILEOCOLECTOMY, LYSIS OF ADHESIONS, BILATERAL TAP BLOCK;  Surgeon: Ileana Roup, MD;  [facility];  Service: General;  Laterality: N/A;   spinal ablation  05/23/2022   SPINE SURGERY     Herniated Disc x 2   tummy tuck  2001    Current Outpatient Medications  Medication Instructions   atorvastatin (LIPITOR) 40 mg, Oral, Daily   azathioprine (IMURAN) 200 mg, Oral, Daily   colestipol (COLESTID) 2 g, Oral, 2 times daily   lisinopril (ZESTRIL) 10 mg, Oral, Daily   metFORMIN (GLUCOPHAGE-XR) 500 mg, Oral, Daily with breakfast   RYBELSUS 14 MG TABS 1 tablet, Oral, Every morning   ustekinumab (STELARA) 90 mg, Subcutaneous, Every 6 weeks   XIIDRA 5 % SOLN 1 drop, Ophthalmic, 2 times daily    Allergies as  of 05/25/2022 - Review Complete 05/25/2022  Allergen Reaction Noted   Humira [adalimumab] Other (See Comments) 10/05/2018   Penicillins Hives 05/29/2014    Family History  Problem Relation Age of Onset   Breast cancer Mother    Esophageal cancer Father    Leukemia Sister 5   Breast cancer Maternal Aunt 51   Breast cancer Maternal Grandmother        dx in her late 27s   Heart attack Paternal Grandfather    Cervical cancer Sister    Colon cancer Neg Hx    Liver cancer Neg Hx    Stomach cancer Neg Hx     Social History   Socioeconomic History   Marital status: Married    Spouse name: Not on file   Number of children: 3   Years of education: 18   Highest education level: Not on file  Occupational History   Occupation: realtor  Tobacco Use   Smoking status: Former    Years: 20.00    Types: Cigarettes   Smokeless tobacco: Never  Vaping Use   Vaping Use: Never used  Substance and Sexual Activity   Alcohol use: Yes    Comment: occasionally   Drug use: No   Sexual activity:  Yes  Other Topics Concern   Not on file  Social History Narrative   Not on file   Social Determinants of Health   Financial Resource Strain: Not on file  Food Insecurity: Not on file  Transportation Needs: Not on file  Physical Activity: Not on file  Stress: Not on file  Social Connections: Not on file  Intimate Partner Violence: Not on file     Physical Exam: BP 136/86 (BP Location: Left Arm, Patient Position: Sitting, Cuff Size: Normal)   Pulse 72   Ht 5' 5"  (1.651 m) Comment: height measured without shoes  Wt 169 lb 8 oz (76.9 kg)   BMI 28.21 kg/m  Constitutional: generally well-appearing Psychiatric: alert and oriented x3 Abdomen: soft, nontender, nondistended, no obvious ascites, no peritoneal signs, normal bowel sounds No peripheral edema noted in lower extremities  Assessment and plan: 66 y.o. female with Crohn's ileocolitis.  Azathioprine is clearly causing some  transaminase elevation and so she is going to stop it and we are adding it to her "allergy" list.  She will be back on monotherapy with Stelara at every 6-week dosing.  For the most part this has helped her quite well.  However every week or so she has 24-hour period where she has fevers, generalized abdominal discomforts and loosening of her stool.  I am not sure that this is related to inflammatory Crohn's disease given that she has no inflammation on MR enterography, and no inflammation on any blood test or stool test.  These are unusual symptoms for Crohn's flares certainly.  I explained to her that I am really not sure what is going on and I am going to refer her to [facility] Dr. Sinclair Grooms for his expert opinion.  She will continue Stelara at her current dose for now.  She will return to see me in about 4 months and sooner if needed.  Please see the "Patient Instructions" section for addition details about the plan.  Owens Loffler, MD [facility] 05/25/2022, 8:59 AM   Total time on date of encounter was 40 minutes (this included time spent preparing to see the patient reviewing records; obtaining and/or reviewing separately obtained history; performing a medically appropriate exam and/or evaluation; counseling and educating the patient and family if present; ordering medications, tests or procedures if applicable; and documenting clinical information in the health record).

## 2022-05-27 ENCOUNTER — Encounter: Payer: Self-pay | Admitting: Gastroenterology

## 2022-05-27 ENCOUNTER — Encounter: Payer: Self-pay | Admitting: Neurology

## 2022-05-27 NOTE — Telephone Encounter (Signed)
Dr Ardis Hughs see the message from the pt and advise thank you

## 2022-05-30 ENCOUNTER — Other Ambulatory Visit: Payer: Self-pay | Admitting: *Deleted

## 2022-05-30 MED ORDER — ALPRAZOLAM 1 MG PO TABS: ORAL_TABLET | ORAL | 0 refills | Status: DC

## 2022-06-01 DIAGNOSIS — G8929 Other chronic pain: Secondary | ICD-10-CM | POA: Insufficient documentation

## 2022-06-07 ENCOUNTER — Encounter: Payer: Self-pay | Admitting: Gastroenterology

## 2022-06-08 ENCOUNTER — Ambulatory Visit
Admission: RE | Admit: 2022-06-08 | Discharge: 2022-06-08 | Disposition: A | Discharge status: Home / Self Care | Payer: 59 | Source: Ambulatory Visit | Attending: Neurology | Admitting: Neurology

## 2022-06-08 DIAGNOSIS — R519 Headache, unspecified: Secondary | ICD-10-CM

## 2022-06-08 DIAGNOSIS — H532 Diplopia: Secondary | ICD-10-CM

## 2022-06-08 MED ORDER — GADOBENATE DIMEGLUMINE 529 MG/ML IV SOLN
15.0000 mL | Freq: Once | INTRAVENOUS | Status: AC | PRN
  Administered 2022-06-08: 15 mL via INTRAVENOUS

## 2022-06-13 ENCOUNTER — Other Ambulatory Visit (INDEPENDENT_AMBULATORY_CARE_PROVIDER_SITE_OTHER): Payer: 59

## 2022-06-13 DIAGNOSIS — K50819 Crohn's disease of both small and large intestine with unspecified complications: Secondary | ICD-10-CM | POA: Diagnosis not present

## 2022-06-13 LAB — HEPATIC FUNCTION PANEL
ALT: 56 U/L — ABNORMAL HIGH (ref 0–35)
AST: 28 U/L (ref 0–37)
Albumin: 4.5 g/dL (ref 3.5–5.2)
Alkaline Phosphatase: 67 U/L (ref 39–117)
Bilirubin, Direct: 0.2 mg/dL (ref 0.0–0.3)
Total Bilirubin: 0.6 mg/dL (ref 0.2–1.2)
Total Protein: 6.9 g/dL (ref 6.0–8.3)

## 2022-06-27 NOTE — Telephone Encounter (Signed)
Recommend for now patient to keep the currently scheduled clinic visit and call every week or 2 to see if an appointment opens up with another GI provider sooner.   Thanks. GM

## 2022-07-22 ENCOUNTER — Other Ambulatory Visit: Payer: Self-pay | Admitting: Gastroenterology

## 2022-08-19 ENCOUNTER — Other Ambulatory Visit (HOSPITAL_COMMUNITY): Payer: Self-pay

## 2022-08-22 ENCOUNTER — Other Ambulatory Visit (HOSPITAL_COMMUNITY): Payer: Self-pay

## 2022-08-22 ENCOUNTER — Telehealth: Payer: Self-pay

## 2022-08-22 NOTE — Telephone Encounter (Signed)
Patient Advocate Encounter   Received notification that prior authorization for Stelara 90MG/ML syringes is required/requested.    PA submitted on 08/22/22 to [employer] via CoverMyMeds Key [id number]  Status is pending

## 2022-08-30 ENCOUNTER — Other Ambulatory Visit (HOSPITAL_COMMUNITY): Payer: Self-pay

## 2022-08-30 ENCOUNTER — Encounter: Payer: Self-pay | Admitting: Gastroenterology

## 2022-08-30 DIAGNOSIS — K50819 Crohn's disease of both small and large intestine with unspecified complications: Secondary | ICD-10-CM

## 2022-08-30 NOTE — Telephone Encounter (Signed)
Received a fax from [employer] regarding Prior Authorization for Stelara 90MG/ML syringes.  Key: [id number]  Authorization has been DENIED due to no documentation of positive clinical response to therapy.  New request submitted with additional clinical information for Stelara 90MG/ML syringes.  New KEY: [id number]  Status is pending

## 2022-08-31 NOTE — Telephone Encounter (Signed)
Monchell can you please review?

## 2022-09-01 ENCOUNTER — Other Ambulatory Visit (HOSPITAL_COMMUNITY): Payer: Self-pay

## 2022-09-01 NOTE — Telephone Encounter (Signed)
I see the documentation from the 05/25/22 office note with Dr Ardis Hughs     "Stelara trough drug level 2.1ug/mL, no circulating antibodies (blood work August 2020); began process of every 6 week frequency dosing change.  Started every 6 weeks Stelara October 2020.  Trough level January 2021 greater than 10 ug/mL"   Is there a peer to peer option?

## 2022-09-01 NOTE — Telephone Encounter (Addendum)
Patient Advocate Encounter  Received notification from [employer] that the request for prior authorization for Stelara 90MG/ML syringes has been denied. The request for 1 STELARA INJ 90MG/ML every 6 weeks is denied. This decision is based on health plan criteria for STELARA INJ 90MG/ML. Your plan allows you to receive 1 syringe every 8 weeks. Medication quantities above the maximum benefit limit are excluded under the plan.  *Please note: STELARA INJ 90MG/ML has been approved for up to 1 syringe every 8 weeks through 08/31/2023 or until coverage for the medication is no longer available under your benefit plan or the medication becomes subject to a pharmacy benefit coverage requirement, such as supply limits or notification, whichever occurs first as allowed by law  Key: [id number]

## 2022-09-09 ENCOUNTER — Other Ambulatory Visit (INDEPENDENT_AMBULATORY_CARE_PROVIDER_SITE_OTHER): Payer: 59

## 2022-09-09 DIAGNOSIS — K50819 Crohn's disease of both small and large intestine with unspecified complications: Secondary | ICD-10-CM

## 2022-09-09 LAB — CBC WITH DIFFERENTIAL/PLATELET
Basophils Absolute: 0.1 10*3/uL (ref 0.0–0.1)
Basophils Relative: 0.9 % (ref 0.0–3.0)
Eosinophils Absolute: 0.2 10*3/uL (ref 0.0–0.7)
Eosinophils Relative: 2.6 % (ref 0.0–5.0)
HCT: 43.1 % (ref 36.0–46.0)
Hemoglobin: 14.7 g/dL (ref 12.0–15.0)
Lymphocytes Relative: 18.5 % (ref 12.0–46.0)
Lymphs Abs: 1.5 10*3/uL (ref 0.7–4.0)
MCHC: 34 g/dL (ref 30.0–36.0)
MCV: 91.2 fl (ref 78.0–100.0)
Monocytes Absolute: 0.4 10*3/uL (ref 0.1–1.0)
Monocytes Relative: 5.3 % (ref 3.0–12.0)
Neutro Abs: 5.7 10*3/uL (ref 1.4–7.7)
Neutrophils Relative %: 72.7 % (ref 43.0–77.0)
Platelets: 271 10*3/uL (ref 150.0–400.0)
RBC: 4.72 Mil/uL (ref 3.87–5.11)
RDW: 13.7 % (ref 11.5–15.5)
WBC: 7.9 10*3/uL (ref 4.0–10.5)

## 2022-09-09 LAB — VITAMIN B12: Vitamin B-12: 353 pg/mL (ref 211–911)

## 2022-09-09 LAB — COMPREHENSIVE METABOLIC PANEL
ALT: 29 U/L (ref 0–35)
AST: 16 U/L (ref 0–37)
Albumin: 4.6 g/dL (ref 3.5–5.2)
Alkaline Phosphatase: 72 U/L (ref 39–117)
BUN: 15 mg/dL (ref 6–23)
CO2: 26 mEq/L (ref 19–32)
Calcium: 9.4 mg/dL (ref 8.4–10.5)
Chloride: 102 mEq/L (ref 96–112)
Creatinine, Ser: 0.89 mg/dL (ref 0.40–1.20)
GFR: 68.68 mL/min (ref >60.00)
Glucose, Bld: 109 mg/dL — ABNORMAL HIGH (ref 70–99)
Potassium: 4 mEq/L (ref 3.5–5.1)
Sodium: 139 mEq/L (ref 135–145)
Total Bilirubin: 0.6 mg/dL (ref 0.2–1.2)
Total Protein: 7 g/dL (ref 6.0–8.3)

## 2022-09-09 LAB — HIGH SENSITIVITY CRP: CRP, High Sensitivity: 1.07 mg/L (ref 0.000–5.000)

## 2022-09-09 NOTE — Telephone Encounter (Signed)
DOD  Very strange about Stelara approval.  She has been on it since 2019.  Not sure why it is taking so long. Do they need TB/HBsAG?  I reviewed previous colonoscopy as well.  It appears that she had responded very well.   Also-as per notes she was previously on Imuran as well.  Not listed in her meds.  Please clarify if she is taking it or not.  Plan: - Stool for GI pathogen, fecal calprotectin. - Check CBC, CMP, CRP, B12, TB Gold and HBsAg. - Avoid NSAIDs. - If above shows exacerbation of Crohn's, she would need budesonide or prednisone.  RG

## 2022-09-09 NOTE — Telephone Encounter (Signed)
Monchell see the notes from the pt.  [employer] states that they have not received the appeal.

## 2022-09-09 NOTE — Telephone Encounter (Signed)
The pt has been advised of the status

## 2022-09-09 NOTE — Telephone Encounter (Signed)
Appeal letter has been submitted expedited.

## 2022-09-09 NOTE — Addendum Note (Signed)
Addended by: Timothy Lasso on: 09/09/2022 01:13 PM   Modules accepted: Orders

## 2022-09-09 NOTE — Telephone Encounter (Signed)
Dr Lyndel Safe can you please review the pt messages below as DOD.  We are still waiting for Stelara approval but the pt has symptoms.  She would like to know what she should be in the meantime.

## 2022-09-12 LAB — HEPATITIS B SURFACE ANTIGEN: Hepatitis B Surface Ag: NONREACTIVE

## 2022-09-15 LAB — QUANTIFERON-TB GOLD PLUS
Mitogen-NIL: >10 IU/mL
NIL: 0.08 IU/mL
QuantiFERON-TB Gold Plus: NEGATIVE
TB1-NIL: <0 IU/mL
TB2-NIL: <0 IU/mL

## 2022-09-19 ENCOUNTER — Other Ambulatory Visit (HOSPITAL_COMMUNITY): Payer: Self-pay

## 2022-09-19 NOTE — Telephone Encounter (Signed)
The appeal for Stelara every 42 day dosing has been approved until 09/10/2023.  Case [id number]

## 2022-09-27 ENCOUNTER — Ambulatory Visit (INDEPENDENT_AMBULATORY_CARE_PROVIDER_SITE_OTHER): Payer: 59 | Admitting: Gastroenterology

## 2022-09-27 ENCOUNTER — Encounter: Payer: Self-pay | Admitting: Gastroenterology

## 2022-09-27 VITALS — BP 118/68 | HR 90 | Ht 65.0 in | Wt 165.2 lb

## 2022-09-27 DIAGNOSIS — K50919 Crohn's disease, unspecified, with unspecified complications: Secondary | ICD-10-CM | POA: Diagnosis not present

## 2022-09-27 NOTE — Patient Instructions (Signed)
Follow up in 6 months or sooner if needed.  _______________________________________________________  If you are age 64 or older, your body mass index should be between 23-30. Your Body mass index is 27.49 kg/m. If this is out of the aforementioned range listed, please consider follow up with your Primary Care Provider.  If you are age 68 or younger, your body mass index should be between 19-25. Your Body mass index is 27.49 kg/m. If this is out of the aformentioned range listed, please consider follow up with your Primary Care Provider.   ________________________________________________________  The [place] [facility] providers would like to encourage you to use [facility] to communicate with providers for non-urgent requests or questions.  Due to long hold times on the telephone, sending your provider a message by [facility] may be a faster and more efficient way to get a response.  Please allow 48 business hours for a response.  Please remember that this is for non-urgent requests.  _______________________________________________________

## 2022-09-27 NOTE — Progress Notes (Signed)
09/27/2022 Angela [name] [id number] [birth year]  1. Crohns ileocolitis: Diagnosed 2019 with a inflammatory process in the right lower quadrant involving the cecum and terminal ileum.  Eventual colonoscopy with Dr. Lizbeth Bark April 2019 showed "congested, red, pseudopolypoid and scar area in the ascending colon, the proximal ascending colon and the IC valve, congested mucosa in the distal sigmoid" endoscopically this was suspicious for Crohn's disease.  Pathology showed erosion, focal active cryptitis and mild crypt or distortion.  No granulomas.  She had a hyperplastic polyp removed.  [facility] IBD testing was "positive for Crohn's disease".  She was evaluated by Dr. Annye English at [facility] surgery he recommended second opinion GI and she transferred her care to [facility].  I did feel that she had Crohn's ileocolitis and started her on Humira June 2019.  Colonoscopy February 2021 Dr. Ardis Hughs TI was stenotic with a pinpoint lumen which could not be intubated..  Just distal to the IC valve there was a frond-like mucosa though suspicious for colocolonic fistula described on recent CT.  Right colon was normal-appearing, left colon was normal except for a 10 cm segment that appeared congested and edematous.  Pathology showed no active inflammation or chronic changes. Humira start June 2019: Significant side effects including extreme myalgias, double vision, significant injection site pain, flulike symptoms.  Changed to stelara 08/2018 TB quant gold negative June 2019 Hepatitis B surface antibody and antigen were -June 2019 Pneumococcal immunizations started June 2019 with Prevnar 13. Stelara trough drug level 2.1ug/mL, no circulating antibodies (blood work August 2020); began process of every 6 week frequency dosing change.  Started every 6 weeks Stelara October 2020.  Trough level January 2021 greater than 10 ug/mL Lap assisted ileocolonic resection, lysis of adhesion April 2021 Dr. 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  Biopsies from her neoterminal ileum were essentially normal, random biopsies from the colon were all normal. MR enterography 12/2021 no sign of active inflammation, also sed rate, CRP, fecal calprotectin were all normal, GI pathogen was normal.  Multidisciplinary care with Dr. Dema Severin: We decided to try increasing her Crohn's therapy by adding immune modulator azathioprine.  TPMT enzyme activity was normal Transaminases elevated shortly after starting her azathioprine.  Stopped this medicine July 2023 2.  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   3.  C. difficile infection: Presented with urgency, frequency.  January 2022 Stelara antibodies negative, Stelara drug level appropriate (12).  Proven by toxin PCR January 2022, remainder of GI path panel was negative.  Treated with vancomycin 125 mg p.o. 4 times daily for 2 weeks.   Recurrent, untreated C. difficile positive again by PCR toxin March 2022; started dificid 200 BID for 10 days    HISTORY OF PRESENT ILLNESS: This is a 66 year old female who is a patient of Dr. Ardis Hughs.  She has Crohn's ileocolitis and has been on Stelara every 6 weeks for quite some time.  It does get denied each year and has to go through appeal.  Nonetheless, when she saw him back at the beginning of July she was still having some symptoms.  He had referred her to [facility] to see Dr. Sinclair Grooms.  She was not able to get an appointment until early December and the appt is not even with him specifically.  Very shortly after  she saw Dr. Ardis Hughs though she says she started to feel better and she has felt great since then.  She actually missed her Stelara for a short period time due to the appeals process  and says that she had a little bit of trouble during that time, but obviously it was accounted for.  She is otherwise doing well and would like to cancel that appointment.   Past Medical History:  Diagnosis Date   C. difficile diarrhea    Crohn's colitis ([place])    Diabetes mellitus without complication ([place])    Family history of breast cancer    Hyperlipidemia    Hypertension    PONV (postoperative nausea and vomiting)    Past Surgical History:  Procedure Laterality Date   BIOPSY  04/29/2021   Procedure: BIOPSY;  Surgeon: Milus Banister, MD;  Location: [facility] ENDOSCOPY;  Service: Endoscopy;;   CESAREAN SECTION     x2   CHOLECYSTECTOMY  2011   COLONOSCOPY  12/2019   COLONOSCOPY WITH PROPOFOL N/A 04/29/2021   Procedure: COLONOSCOPY WITH PROPOFOL;  Surgeon: Milus Banister, MD;  Location: [facility];  Service: Endoscopy;  Laterality: N/A;   FLEXIBLE SIGMOIDOSCOPY  03/04/2020   Procedure: DIAGNOSTIC FLEXIBLE SIGMOIDOSCOPY;  Surgeon: Ileana Roup, MD;  Location: [facility] ORS;  Service: General;;   KNEE SURGERY Right    LAPAROSCOPIC SUBTOTAL COLECTOMY N/A 03/04/2020   Procedure: LAPAROSCOPIC ILEOCOLECTOMY, LYSIS OF ADHESIONS, BILATERAL TAP BLOCK;  Surgeon: Ileana Roup, MD;  Location: [facility] ORS;  Service: General;  Laterality: N/A;   spinal ablation  05/23/2022   SPINE SURGERY     Herniated Disc x 2   tummy tuck  2001    reports that she has quit smoking. Her smoking use included cigarettes. She has never used smokeless tobacco. She reports current alcohol use. She reports that she does not use drugs. family history includes Breast cancer in her maternal grandmother and mother; Breast cancer (age of onset: 7) in her maternal aunt; Cervical cancer in her sister; Esophageal cancer in her father; Heart attack in her paternal grandfather; Leukemia (age of onset: 24) in her sister. Allergies  Allergen Reactions   Humira [Adalimumab] Other (See Comments)    Joints aches/double  vision/flu-like symptoms.   Azathioprine Other (See Comments)   Penicillins Hives    Has patient had a PCN reaction causing immediate rash, facial/tongue/throat swelling, SOB or lightheadedness with hypotension: Yes Has patient had a PCN reaction causing severe rash involving mucus membranes or skin necrosis: No Has patient had a PCN reaction that required hospitalization: No Has patient had a PCN reaction occurring within the last 10 years: No If all of the above answers are "NO", then may proceed with Cephalosporin use.       Outpatient Encounter Medications as of 09/27/2022  Medication Sig   ALPRAZolam (XANAX) 1 MG tablet Take 1-2 tablets thirty minutes prior to MRI.  May take one additional tablet before entering scanner, if needed.  MUST HAVE DRIVER.   atorvastatin (LIPITOR) 40 MG tablet Take 40 mg by mouth daily.   colestipol (COLESTID) 1 g tablet TAKE 2 TABLETS BY MOUTH 2 TIMES DAILY.   lisinopril (ZESTRIL) 10 MG tablet Take 10 mg by mouth daily.   metFORMIN (GLUCOPHAGE-XR) 500 MG 24 hr tablet Take 500 mg by mouth daily with breakfast.   RYBELSUS 14 MG TABS Take 1 tablet by mouth every morning.   ustekinumab (STELARA) 90 MG/ML SOSY injection Inject 1 mL (90 mg total) into the  skin every 6 (six) weeks.   XIIDRA 5 % SOLN Apply 1 drop to eye 2 (two) times daily.   No facility-administered encounter medications on file as of 09/27/2022.     REVIEW OF SYSTEMS  : All other systems reviewed and negative except where noted in the History of Present Illness.   PHYSICAL EXAM: BP 118/68   Pulse 90   Ht 5' 5"  (1.651 m)   Wt 165 lb 3.2 oz (74.9 kg)   SpO2 97%   BMI 27.49 kg/m  General: Well developed white female in no acute distress Head: Normocephalic and atraumatic Eyes:  Sclerae anicteric, conjunctiva pink. Ears: Normal auditory acuity Lungs: Clear throughout to auscultation; no W/R/R. Heart: Regular rate and rhythm; no M/R/G. Abdomen: Soft, non-distended.  BS present.   Non-tender. Musculoskeletal: Symmetrical with no gross deformities  Skin: No lesions on visible extremities Extremities: No edema  Neurological: Alert oriented x 4, grossly non-focal Psychological:  Alert and cooperative. Normal mood and affect  ASSESSMENT AND PLAN: 66 year old female with Crohn's ileocolitis: Has actually been doing very well since her last visit here with Dr. Ardis Hughs in early July.  She has an appointment at [facility] in early December, but she says that its not even with Dr. Sinclair Grooms who she was referred to so she would like to cancel that since she has been feeling well for a few months now.  Recent labs are normal including a CRP.  She did not perform fecal calprotectin as she was feeling well.  We will reserve that for if she has another flare of symptoms and contacts [place] back.  Otherwise she will continue Stelara at her current dose for now.  She will follow-up in 6 months or sooner if needed.   CC:  Milus Banister, MD

## 2022-10-05 LAB — HEMOGLOBIN A1C: Hemoglobin A1C: 5.1

## 2022-10-14 ENCOUNTER — Other Ambulatory Visit: Payer: Self-pay | Admitting: Gastroenterology

## 2022-10-17 NOTE — Telephone Encounter (Signed)
Dr Hilarie Fredrickson you are DOD this afternoon is it ok to refill this pt stelara under your name?

## 2022-10-17 NOTE — Telephone Encounter (Signed)
Chart was routed to Dr. Silverio Decamp when the patient was seen recently by Alonza Bogus, PA-C Medication should likely be ordered under Dr. Silverio Decamp if the plan was for her to follow-up with Dr. Silverio Decamp until Dr. Ardis Hughs returns We do not want to delay therapy and so if Dr. Silverio Decamp not immediately available, it can be ordered under me for now

## 2022-10-18 NOTE — Telephone Encounter (Signed)
Dr Silverio Decamp ok to refill ?

## 2022-10-20 ENCOUNTER — Telehealth: Payer: Self-pay | Admitting: Gastroenterology

## 2022-10-20 MED ORDER — USTEKINUMAB 90 MG/ML ~~LOC~~ SOSY: 90.0000 mg | PREFILLED_SYRINGE | SUBCUTANEOUS | 6 refills | Status: AC

## 2022-10-20 NOTE — Telephone Encounter (Signed)
Per Janett Billow Zehr's last office note on 11/7 pt should continue Stelara at current dose.  The note was signed off on by Dr Silverio Decamp so stelara was refilled.  Prescription sent to [facility].

## 2022-10-20 NOTE — Telephone Encounter (Signed)
Inbound call from [facility] requesting a refill for Stelara for patient. Please advise.

## 2022-10-25 ENCOUNTER — Ambulatory Visit: Payer: 59 | Admitting: Family Medicine

## 2022-11-10 LAB — LIPID PANEL
Cholesterol: 157 (ref 0–200)
HDL: 36 (ref 35–70)
LDL Cholesterol: 84
Triglycerides: 217 — AB (ref 40–160)

## 2022-11-10 LAB — CBC AND DIFFERENTIAL
HCT: 43 (ref 36–46)
Hemoglobin: 14.5 (ref 12.0–16.0)
Neutrophils Absolute: 3.5
Platelets: 251 10*3/uL (ref 150–400)
WBC: 5.7

## 2022-11-10 LAB — HEPATIC FUNCTION PANEL
ALT: 38 U/L — AB (ref 7–35)
AST: 21 (ref 13–35)
Alkaline Phosphatase: 62 (ref 25–125)
Bilirubin, Total: 0.5

## 2022-11-10 LAB — BASIC METABOLIC PANEL
BUN: 22 — AB (ref 4–21)
BUN: 22 — AB (ref 4–21)
CO2: 26 — AB (ref 13–22)
Chloride: 106 (ref 99–108)
Creatinine: 0.8 (ref 0.5–1.1)
Creatinine: 0.8 (ref 0.5–1.1)
Glucose: 95
Glucose: 95
Potassium: 4.5 mEq/L (ref 3.5–5.1)
Sodium: 139 (ref 137–147)

## 2022-11-10 LAB — COMPREHENSIVE METABOLIC PANEL
Albumin: 4.6 (ref 3.5–5.0)
Calcium: 9.6 (ref 8.7–10.7)
eGFR: 77
eGFR: 77

## 2022-11-10 LAB — PROTEIN / CREATININE RATIO, URINE: Creatinine, Urine: 164.1

## 2022-11-10 LAB — TSH: TSH: 1.31 (ref 0.41–5.90)

## 2022-11-10 LAB — CBC: RBC: 4.81 (ref 3.87–5.11)

## 2022-11-18 LAB — HM DIABETES EYE EXAM

## 2022-11-28 ENCOUNTER — Encounter: Payer: Self-pay | Admitting: Family Medicine

## 2022-12-01 LAB — HM DIABETES EYE EXAM

## 2022-12-02 ENCOUNTER — Encounter: Payer: Self-pay | Admitting: Family Medicine

## 2022-12-02 ENCOUNTER — Ambulatory Visit (INDEPENDENT_AMBULATORY_CARE_PROVIDER_SITE_OTHER): Payer: 59 | Admitting: Family Medicine

## 2022-12-02 VITALS — BP 114/79 | HR 71 | Temp 97.9°F | Ht 65.0 in | Wt 164.4 lb

## 2022-12-02 DIAGNOSIS — I700 Atherosclerosis of aorta: Secondary | ICD-10-CM

## 2022-12-02 DIAGNOSIS — R519 Headache, unspecified: Secondary | ICD-10-CM

## 2022-12-02 DIAGNOSIS — I679 Cerebrovascular disease, unspecified: Secondary | ICD-10-CM

## 2022-12-02 DIAGNOSIS — Z7689 Persons encountering health services in other specified circumstances: Secondary | ICD-10-CM

## 2022-12-02 DIAGNOSIS — Z974 Presence of external hearing-aid: Secondary | ICD-10-CM | POA: Insufficient documentation

## 2022-12-02 DIAGNOSIS — E782 Mixed hyperlipidemia: Secondary | ICD-10-CM | POA: Diagnosis not present

## 2022-12-02 DIAGNOSIS — E1169 Type 2 diabetes mellitus with other specified complication: Secondary | ICD-10-CM

## 2022-12-02 DIAGNOSIS — I10 Essential (primary) hypertension: Secondary | ICD-10-CM

## 2022-12-02 DIAGNOSIS — K50819 Crohn's disease of both small and large intestine with unspecified complications: Secondary | ICD-10-CM

## 2022-12-02 MED ORDER — CYCLOBENZAPRINE HCL 5 MG PO TABS: 5.0000 mg | ORAL_TABLET | Freq: Every day | ORAL | 0 refills | Status: DC

## 2022-12-02 NOTE — Progress Notes (Signed)
Patient ID: Shaleena Crusoe, female  DOB: 02-10-1956, 67 y.o.   MRN: [id number] Patient Care Team    Relationship Specialty Notifications Start End  Ma Hillock, DO PCP - General Family Medicine  12/02/22   [facility] ophthamology Consulting Physician Ophthalmology  05/06/21   Milus Banister, MD Attending Physician Gastroenterology  12/02/22   Bayard Hugger, MD Referring Physician Neurosurgery  12/02/22   Marcial Pacas, MD Consulting Physician Neurology  12/02/22     Chief Complaint  Patient presents with   Establish Care    Diabetes [facility]   Headache    Subjective: Lynette Topete is a 67 y.o.  female present for new patient establishment. All past medical history, surgical history, allergies, family history, immunizations, medications and social history were updated in the electronic medical record today. All recent labs, ED visits and hospitalizations within the last year were reviewed.  Type 2 diabetes mellitus with stage 3a chronic kidney disease, without long-term current use of insulin ([place]) Patient reports compliance with Mounjaro 7.5 mg weekly which was started recently and she has been tapering up.  She has had her labs collected at her prior PCP within the last few weeks.  Records have been requested. Denies numbness, tingling of extremities, hypo/hyperglycemic events or non-healing wounds.  GFR- 68 (09/09/2022)  Primary hypertension/Mixed hyperlipidemia/Hardening of the aorta (main artery of the heart) [facility]) Patient has seen her last PCP recently. Pt reports compliance with lisinopril 20 mg daily. Blood pressures ranges at home normal. Patient denies chest pain, shortness of breath or lower extremity edema.  Patient is prescribed statin  RF: Hypertension, hyperlipidemia, diabetes  Crohn's disease of small and large intestines with complication ([place]) Managed by Dr. Ardis Hughs. Prescribed Stelara.       12/02/2022   11:02 AM 08/24/2020    2:56 PM 02/06/2018     9:07 AM  Depression screen PHQ 2/9  Decreased Interest 0 0 0  Down, Depressed, Hopeless 0 0 0  PHQ - 2 Score 0 0 0       No data to display                08/24/2020    2:56 PM 02/06/2018    9:07 AM  Fall Risk   Falls in the past year? 0 No   Immunization History  Administered Date(s) Administered   Pneumococcal Conjugate-13 05/14/2018    No results found.  Past Medical History:  Diagnosis Date   C. difficile diarrhea    Crohn's colitis ([place])    Diabetes mellitus without complication ([place])    Family history of breast cancer    Hyperlipidemia    Hypertension    PONV (postoperative nausea and vomiting)    Allergies  Allergen Reactions   Humira [Adalimumab] Other (See Comments)    Joints aches/double vision/flu-like symptoms.   Azathioprine Other (See Comments)   Penicillins Hives    Has patient had a PCN reaction causing immediate rash, facial/tongue/throat swelling, SOB or lightheadedness with hypotension: Yes Has patient had a PCN reaction causing severe rash involving mucus membranes or skin necrosis: No Has patient had a PCN reaction that required hospitalization: No Has patient had a PCN reaction occurring within the last 10 years: No If all of the above answers are "NO", then may proceed with Cephalosporin use.    Past Surgical History:  Procedure Laterality Date   APPENDECTOMY     BIOPSY  04/29/2021   Procedure: BIOPSY;  Surgeon: Ardis Hughs,  Melene Plan, MD;  Location: [facility];  Service: Endoscopy;;   CESAREAN SECTION     x2   CHOLECYSTECTOMY  2011   COLONOSCOPY  12/2019   COLONOSCOPY WITH PROPOFOL N/A 04/29/2021   Procedure: COLONOSCOPY WITH PROPOFOL;  Surgeon: Milus Banister, MD;  [facility];  Service: Endoscopy;  Laterality: N/A;   FLEXIBLE SIGMOIDOSCOPY  03/04/2020   Procedure: DIAGNOSTIC FLEXIBLE SIGMOIDOSCOPY;  Surgeon: Ileana Roup, MD;  Location[facility];  Service: General;;   KNEE SURGERY Right    LAPAROSCOPIC SUBTOTAL  COLECTOMY N/A 03/04/2020   Procedure: LAPAROSCOPIC ILEOCOLECTOMY, LYSIS OF ADHESIONS, BILATERAL TAP BLOCK;  Surgeon: Ileana Roup, MD;  Location: [facility];  Service: General;  Laterality: N/A;   spinal ablation  05/23/2022   SPINE SURGERY     Herniated Disc x 2   tummy tuck  2001   Family History  Problem Relation Age of Onset   Leukemia Mother    Heart failure Mother    Hypertension Mother    Diabetes Mother    Breast cancer Mother    Hearing loss Mother    Kidney disease Mother    Hypertension Father    Cancer Father    Esophageal cancer Father    Depression Father    Alcohol abuse Father    Depression Sister    Cancer Sister    Leukemia Sister 5   Cervical cancer Sister    Breast cancer Maternal Grandmother        dx in her late 63s   Cancer Maternal Grandfather    Heart attack Paternal Grandfather    Breast cancer Maternal Aunt 49   Colon cancer Neg Hx    Liver cancer Neg Hx    Stomach cancer Neg Hx    Social History   Social History Narrative   Marital status/children/pets: married, 3 children   Education/employment: masters degree-retired   Safety:      -smoke alarm in the home:Yes     - wears seatbelt: Yes     - Feels safe in their relationships: Yes      Uses hearing aid       Allergies as of 12/02/2022       Reactions   Humira [adalimumab] Other (See Comments)   Joints aches/double vision/flu-like symptoms.   Azathioprine Other (See Comments)   Penicillins Hives   Has patient had a PCN reaction causing immediate rash, facial/tongue/throat swelling, SOB or lightheadedness with hypotension: Yes Has patient had a PCN reaction causing severe rash involving mucus membranes or skin necrosis: No Has patient had a PCN reaction that required hospitalization: No Has patient had a PCN reaction occurring within the last 10 years: No If all of the above answers are "NO", then may proceed with Cephalosporin use.        Medication List         Accurate as of December 02, 2022  2:49 PM. If you have any questions, ask your nurse or doctor.          STOP taking these medications    ALPRAZolam 1 MG tablet Commonly known as: XANAX Stopped by: Howard Pouch, DO   colestipol 1 g tablet Commonly known as: COLESTID Stopped by: Howard Pouch, DO   metFORMIN 500 MG 24 hr tablet Commonly known as: GLUCOPHAGE-XR Stopped by: Howard Pouch, DO   Rybelsus 14 MG Tabs Generic drug: Semaglutide Stopped by: Howard Pouch, DO   Valium 5 MG tablet Generic drug: diazepam Stopped by: Joseph Art  Brannon Decaire, DO       TAKE these medications    atorvastatin 40 MG tablet Commonly known as: LIPITOR Take 40 mg by mouth daily.   cyclobenzaprine 5 MG tablet Commonly known as: FLEXERIL Take 1 tablet (5 mg total) by mouth at bedtime. Started by: Howard Pouch, DO   lisinopril 20 MG tablet Commonly known as: ZESTRIL Take 20 mg by mouth daily. What changed: Another medication with the same name was removed. Continue taking this medication, and follow the directions you see here. Changed by: Howard Pouch, DO   Mounjaro 7.5 MG/0.5ML Pen Generic drug: tirzepatide Inject into the skin.   Stelara 90 MG/ML Sosy injection Generic drug: ustekinumab INJECT 1 SYRINGE SUBCUTANEOUSLY  EVERY 6 WEEKS   Xiidra 5 % Soln Generic drug: Lifitegrast Apply 1 drop to eye 2 (two) times daily.        All past medical history, surgical history, allergies, family history, immunizations andmedications were updated in the EMR today and reviewed under the history and medication portions of their EMR.      MR BRAIN W WO CONTRAST Result Date: 06/09/2022 FINDINGS: The brain parenchyma shows mild age-related changes of chronic small vessel disease and minimal supratentorial cortical atrophy.  No other structural lesion, tumor or infarct is noted.  Diffusion-weighted imaging is negative for acute ischemia.  SWI sequences do not show any microhemorrhages.  Subarachnoid  space and ventricular system appear normal.  Cortical sulci and gyri show normal appearance.  Extra-axial brain structures appear normal.  Calvarium shows no abnormalities.  Orbits appear unremarkable.  Paranasal sinuses show mild chronic inflammatory changes.  The pituitary gland and cerebellar tonsils within normal.  Visualized portion of the upper cervical spine shows no abnormalities.  Flow-voids of large vessels of intracranial circulation appear to be patent.  Postcontrast images do not result in abnormal areas of enhancement.   ROS 14 pt review of systems performed and negative (unless mentioned in an HPI)  Objective: BP 114/79   Pulse 71   Temp 97.9 F (36.6 C)   Ht '5\' 5"'$  (1.651 m)   Wt 164 lb 6.4 oz (74.6 kg)   SpO2 97%   BMI 27.36 kg/m  Physical Exam Vitals and nursing note reviewed.  Constitutional:      General: She is not in acute distress.    Appearance: Normal appearance. She is not ill-appearing, toxic-appearing or diaphoretic.  HENT:     Head: Normocephalic and atraumatic.  Eyes:     General: No scleral icterus.       Right eye: No discharge.        Left eye: No discharge.     Extraocular Movements: Extraocular movements intact.     Conjunctiva/sclera: Conjunctivae normal.     Pupils: Pupils are equal, round, and reactive to light.  Cardiovascular:     Rate and Rhythm: Normal rate and regular rhythm.  Pulmonary:     Effort: Pulmonary effort is normal. No respiratory distress.     Breath sounds: Normal breath sounds. No wheezing, rhonchi or rales.  Musculoskeletal:     Right lower leg: No edema.     Left lower leg: No edema.  Skin:    General: Skin is warm.     Findings: No rash.  Neurological:     Mental Status: She is alert and oriented to person, place, and time. Mental status is at baseline.     Motor: No weakness.     Gait: Gait normal.  Psychiatric:  Mood and Affect: Mood normal.        Behavior: Behavior normal.        Thought Content:  Thought content normal.        Judgment: Judgment normal.     Assessment/plan: Semaya Vida is a 67 y.o. female present for est/cmc Establishing care with new doctor, encounter for DM type 2 with diabetic mixed hyperlipidemia ([place]) Will follow-up in about 3 weeks.  At that time A1c will be completed if needed.  By then we should have her records and she will be due for [facility] tapering at that time.  Primary hypertension/hyperlipidemia/atherosclerosis of the aorta Stable Continue lisinopril 20 mg daily Continue atorvastatin 40 mg daily Awaiting records to see if labs are due.   Crohn's disease of small and large intestines with complication ([place]) Managed by GI. Continue Stelara  Cerebrovascular small vessel disease Incidental finding recent MRI through neurology Increased frequency of headaches Today we discussed her increased frequency of headaches per patient's desire. I encouraged her to start a B complex vitamin.  Her B12 levels were 323 earlier this year. We discussed differential causes of daily headaches.  She points to her temples as the location of the headaches.  She has had chronic back pain and surgeries.  Possibly cervical tension causing symptoms.  Will attempt low-dose Flexeril nightly to see if this helps resolve problems. If symptoms or not relieved with muscle relaxation, would consider sleep apnea as possible cause and refer for sleep study. We will consider B12, vitamin D and magnesium labs next visit.  Return in about 3 weeks (around 12/23/2022).  No orders of the defined types were placed in this encounter.  Meds ordered this encounter  Medications   cyclobenzaprine (FLEXERIL) 5 MG tablet    Sig: Take 1 tablet (5 mg total) by mouth at bedtime.    Dispense:  30 tablet    Refill:  0   Referral Orders  No referral(s) requested today     Note is dictated utilizing voice recognition software. Although note has been proof read prior to signing,  occasional typographical errors still can be missed. If any questions arise, please do not hesitate to call for verification.  Electronically signed by: Howard Pouch, DO [place]

## 2022-12-02 NOTE — Patient Instructions (Addendum)
Return in about 3 weeks (around 12/23/2022).        Great to see you today.    If labs were collected, we will inform you of lab results once received either by echart message or telephone call.   - echart message- for normal results that have been seen by the patient already.   - telephone call: abnormal results or if patient has not viewed results in their echart.

## 2022-12-09 ENCOUNTER — Other Ambulatory Visit: Payer: Self-pay

## 2022-12-23 ENCOUNTER — Encounter: Payer: Self-pay | Admitting: Family Medicine

## 2022-12-23 ENCOUNTER — Ambulatory Visit (INDEPENDENT_AMBULATORY_CARE_PROVIDER_SITE_OTHER): Payer: 59 | Admitting: Family Medicine

## 2022-12-23 VITALS — BP 114/80 | HR 81 | Temp 98.2°F | Wt 162.8 lb

## 2022-12-23 DIAGNOSIS — E1169 Type 2 diabetes mellitus with other specified complication: Secondary | ICD-10-CM

## 2022-12-23 DIAGNOSIS — I10 Essential (primary) hypertension: Secondary | ICD-10-CM | POA: Diagnosis not present

## 2022-12-23 DIAGNOSIS — I679 Cerebrovascular disease, unspecified: Secondary | ICD-10-CM

## 2022-12-23 DIAGNOSIS — E782 Mixed hyperlipidemia: Secondary | ICD-10-CM

## 2022-12-23 DIAGNOSIS — K50819 Crohn's disease of both small and large intestine with unspecified complications: Secondary | ICD-10-CM

## 2022-12-23 DIAGNOSIS — I700 Atherosclerosis of aorta: Secondary | ICD-10-CM | POA: Diagnosis not present

## 2022-12-23 DIAGNOSIS — R519 Headache, unspecified: Secondary | ICD-10-CM

## 2022-12-23 MED ORDER — LISINOPRIL 20 MG PO TABS: 20.0000 mg | ORAL_TABLET | Freq: Every day | ORAL | 1 refills | Status: DC

## 2022-12-23 MED ORDER — TIRZEPATIDE 12.5 MG/0.5ML ~~LOC~~ SOAJ: 12.5000 mg | SUBCUTANEOUS | 1 refills | Status: DC

## 2022-12-23 MED ORDER — CYCLOBENZAPRINE HCL 5 MG PO TABS: 5.0000 mg | ORAL_TABLET | Freq: Every day | ORAL | 2 refills | Status: DC

## 2022-12-23 MED ORDER — ATORVASTATIN CALCIUM 40 MG PO TABS: 40.0000 mg | ORAL_TABLET | Freq: Every day | ORAL | 1 refills | Status: DC

## 2022-12-23 NOTE — Progress Notes (Signed)
Patient ID: Clayton Jarmon, female  DOB: 07-25-1956, 67 y.o.   MRN: [id number] Patient Care Team    Relationship Specialty Notifications Start End  Ma Hillock, DO PCP - General Family Medicine  12/02/22   [facility] ophthamology Consulting Physician Ophthalmology  05/06/21   Milus Banister, MD Attending Physician Gastroenterology  12/02/22   Bayard Hugger, MD Referring Physician Neurosurgery  12/02/22   Marcial Pacas, MD Consulting Physician Neurology  12/02/22     Chief Complaint  Patient presents with   Hypertension    Subjective: Walida Cajas is a 67 y.o.  female present for Chronic Conditions/illness Management  All past medical history, surgical history, allergies, family history, immunizations, medications and social history were updated in the electronic medical record today. All recent labs, ED visits and hospitalizations within the last year were reviewed.  Type 2 diabetes mellitus with stage 3a chronic kidney disease, without long-term current use of insulin ([place]) Patient reports compliance with Mounjaro 7.5 mg weekly which was started recently and she has been tapering up due to start 10 mg (at pharmacy in a week). Labs utd 12/12023 at priop pcp received. A1c was 5.1. Patient denies dizziness, hyperglycemic or hypoglycemic events. Patient denies numbness, tingling in the extremities or nonhealing wounds of feet.    Primary hypertension/Mixed hyperlipidemia/Hardening of the aorta (main artery of the heart) (HCC) Pt reports compliance  with lisinopril 20 mg daily. Blood pressures ranges at home normal. Patient denies chest pain, shortness of breath, dizziness or lower extremity edema.  RF: Hypertension, hyperlipidemia, diabetes  Crohn's disease of small and large intestines with complication ([place]) Managed by Dr. Ardis Hughs. Prescribed Stelara.   Headaches: Pt reports her headaches are improving. She did start a b12 supplement and using the flexeril 5 mg at night. She  is seeing improvement in her headaches.     12/02/2022   11:02 AM 08/24/2020    2:56 PM 02/06/2018    9:07 AM  Depression screen PHQ 2/9  Decreased Interest 0 0 0  Down, Depressed, Hopeless 0 0 0  PHQ - 2 Score 0 0 0       No data to display                08/24/2020    2:56 PM 02/06/2018    9:07 AM  Fall Risk   Falls in the past year? 0 No   Immunization History  Administered Date(s) Administered   Pneumococcal Conjugate-13 05/14/2018    No results found.  Past Medical History:  Diagnosis Date   C. difficile diarrhea    Crohn's colitis ([place])    Cyclical vomiting syndrome 03/21/2022   Diabetes mellitus without complication ([place])    Diplopia 05/05/2022   Family history of breast cancer    Hyperlipidemia    Hypertension    Intra-abdominal phelgmon (RLQ) 03/08/2018   PONV (postoperative nausea and vomiting)    Allergies  Allergen Reactions   Humira [Adalimumab] Other (See Comments)    Joints aches/double vision/flu-like symptoms.   Azathioprine Other (See Comments)   Penicillins Hives    Has patient had a PCN reaction causing immediate rash, facial/tongue/throat swelling, SOB or lightheadedness with hypotension: Yes Has patient had a PCN reaction causing severe rash involving mucus membranes or skin necrosis: No Has patient had a PCN reaction that required hospitalization: No Has patient had a PCN reaction occurring within the last 10 years: No If all of the above answers are "NO", then may  proceed with Cephalosporin use.    Past Surgical History:  Procedure Laterality Date   APPENDECTOMY     BIOPSY  04/29/2021   Procedure: BIOPSY;  Surgeon: Milus Banister, MD;  Location: [facility];  Service: Endoscopy;;   CESAREAN SECTION     x2   CHOLECYSTECTOMY  2011   COLONOSCOPY  12/2019   COLONOSCOPY WITH PROPOFOL N/A 04/29/2021   Procedure: COLONOSCOPY WITH PROPOFOL;  Surgeon: Milus Banister, MD;  [facility];  Service: Endoscopy;  Laterality: N/A;    FLEXIBLE SIGMOIDOSCOPY  03/04/2020   Procedure: DIAGNOSTIC FLEXIBLE SIGMOIDOSCOPY;  Surgeon: Ileana Roup, MD;  Location: [facility];  Service: General;;   KNEE SURGERY Right    LAPAROSCOPIC SUBTOTAL COLECTOMY N/A 03/04/2020   Procedure: LAPAROSCOPIC ILEOCOLECTOMY, LYSIS OF ADHESIONS, BILATERAL TAP BLOCK;  Surgeon: Ileana Roup, MD;  Location: [facility];  Service: General;  Laterality: N/A;   spinal ablation  05/23/2022   SPINE SURGERY     Herniated Disc x 2   tummy tuck  2001   Family History  Problem Relation Age of Onset   Leukemia Mother    Heart failure Mother    Hypertension Mother    Diabetes Mother    Breast cancer Mother    Hearing loss Mother    Kidney disease Mother    Hypertension Father    Cancer Father    Esophageal cancer Father    Depression Father    Alcohol abuse Father    Depression Sister    Cancer Sister    Leukemia Sister 5   Cervical cancer Sister    Breast cancer Maternal Grandmother        dx in her late 29s   Cancer Maternal Grandfather    Heart attack Paternal Grandfather    Breast cancer Maternal Aunt 49   Colon cancer Neg Hx    Liver cancer Neg Hx    Stomach cancer Neg Hx    Social History   Social History Narrative   Marital status/children/pets: married, 3 children   Education/employment: masters degree-retired   Safety:      -smoke alarm in the home:Yes     - wears seatbelt: Yes     - Feels safe in their relationships: Yes      Uses hearing aid       Allergies as of 12/23/2022       Reactions   Humira [adalimumab] Other (See Comments)   Joints aches/double vision/flu-like symptoms.   Azathioprine Other (See Comments)   Penicillins Hives   Has patient had a PCN reaction causing immediate rash, facial/tongue/throat swelling, SOB or lightheadedness with hypotension: Yes Has patient had a PCN reaction causing severe rash involving mucus membranes or skin necrosis: No Has patient had a PCN reaction that required  hospitalization: No Has patient had a PCN reaction occurring within the last 10 years: No If all of the above answers are "NO", then may proceed with Cephalosporin use.        Medication List        Accurate as of December 23, 2022 10:49 AM. If you have any questions, ask your nurse or doctor.          STOP taking these medications    colestipol 1 g tablet Commonly known as: COLESTID Stopped by: Howard Pouch, DO   Mounjaro 7.5 MG/0.5ML Pen Generic drug: tirzepatide Replaced by: tirzepatide 12.5 MG/0.5ML Pen You also have another medication with the same name that you need to  continue taking as instructed. Stopped by: Howard Pouch, DO       TAKE these medications    atorvastatin 40 MG tablet Commonly known as: LIPITOR Take 1 tablet (40 mg total) by mouth daily.   cyclobenzaprine 5 MG tablet Commonly known as: FLEXERIL Take 1 tablet (5 mg total) by mouth at bedtime.   lisinopril 20 MG tablet Commonly known as: ZESTRIL Take 1 tablet (20 mg total) by mouth daily.   Mounjaro 10 MG/0.5ML Pen Generic drug: tirzepatide 0.5 mL Subcutaneous Once a week for 30 days What changed:  Another medication with the same name was added. Make sure you understand how and when to take each. Another medication with the same name was removed. Continue taking this medication, and follow the directions you see here. Changed by: Howard Pouch, DO   tirzepatide 12.5 MG/0.5ML Pen Commonly known as: MOUNJARO Inject 12.5 mg into the skin once a week. Start taking on: January 20, 2023 What changed: You were already taking a medication with the same name, and this prescription was added. Make sure you understand how and when to take each. Replaces: Mounjaro 7.5 MG/0.5ML Pen Changed by: Howard Pouch, DO   Stelara 90 MG/ML Sosy injection Generic drug: ustekinumab INJECT 1 SYRINGE SUBCUTANEOUSLY  EVERY 6 WEEKS   Xiidra 5 % Soln Generic drug: Lifitegrast Apply 1 drop to eye 2 (two) times  daily.        All past medical history, surgical history, allergies, family history, immunizations andmedications were updated in the EMR today and reviewed under the history and medication portions of their EMR.      MR BRAIN W WO CONTRAST Result Date: 06/09/2022 FINDINGS: The brain parenchyma shows mild age-related changes of chronic small vessel disease and minimal supratentorial cortical atrophy.  No other structural lesion, tumor or infarct is noted.  Diffusion-weighted imaging is negative for acute ischemia.  SWI sequences do not show any microhemorrhages.  Subarachnoid space and ventricular system appear normal.  Cortical sulci and gyri show normal appearance.  Extra-axial brain structures appear normal.  Calvarium shows no abnormalities.  Orbits appear unremarkable.  Paranasal sinuses show mild chronic inflammatory changes.  The pituitary gland and cerebellar tonsils within normal.  Visualized portion of the upper cervical spine shows no abnormalities.  Flow-voids of large vessels of intracranial circulation appear to be patent.  Postcontrast images do not result in abnormal areas of enhancement.   ROS 14 pt review of systems performed and negative (unless mentioned in an HPI)  Objective: BP 114/80   Pulse 81   Temp 98.2 F (36.8 C)   Wt 162 lb 12.8 oz (73.8 kg)   SpO2 98%   BMI 27.09 kg/m  Physical Exam Vitals and nursing note reviewed.  Constitutional:      General: She is not in acute distress.    Appearance: Normal appearance. She is not ill-appearing, toxic-appearing or diaphoretic.  HENT:     Head: Normocephalic and atraumatic.  Eyes:     General: No scleral icterus.       Right eye: No discharge.        Left eye: No discharge.     Extraocular Movements: Extraocular movements intact.     Conjunctiva/sclera: Conjunctivae normal.     Pupils: Pupils are equal, round, and reactive to light.  Cardiovascular:     Rate and Rhythm: Normal rate and regular rhythm.   Pulmonary:     Effort: Pulmonary effort is normal. No respiratory distress.  Breath sounds: Normal breath sounds. No wheezing, rhonchi or rales.  Musculoskeletal:     Right lower leg: No edema.     Left lower leg: No edema.  Skin:    General: Skin is warm.     Findings: No rash.  Neurological:     Mental Status: She is alert and oriented to person, place, and time. Mental status is at baseline.     Motor: No weakness.     Gait: Gait normal.  Psychiatric:        Mood and Affect: Mood normal.        Behavior: Behavior normal.        Thought Content: Thought content normal.        Judgment: Judgment normal.     Assessment/plan: Faron Whitelock is a 67 y.o. female present for cmc DM type 2 with diabetic mixed hyperlipidemia (HCC) A1c 5.1, 2.5 months ago PNA series: will get after 67 Flu shot: declined (recommneded yearly) BMP: UTD 10/2022 Microalb: UTD 10/2022 Foot exam: completed 12/23/2022 Eye exam: 12/01/2022, Dr. Letta Kocher oph.  A1c: 5.1( due next appt q 4-6 mos)   Primary hypertension/hyperlipidemia/atherosclerosis of the aorta Stable Continue lisinopril 20 mg daily Continue atorvastatin 40 mg daily  Crohn's disease of small and large intestines with complication ([place]) Managed by GI. Continue Stelara  Cerebrovascular small vessel disease Incidental finding recent MRI through neurology  Increased frequency of headaches Improved.  Continue B complex vitamin.   Continue low dose Flexeril nightly prn only   Return in about 15 weeks (around 04/07/2023) for Routine chronic condition follow-up.  No orders of the defined types were placed in this encounter.  Meds ordered this encounter  Medications   atorvastatin (LIPITOR) 40 MG tablet    Sig: Take 1 tablet (40 mg total) by mouth daily.    Dispense:  90 tablet    Refill:  1    Please DC any scripts for this med by other providers. New pcp   cyclobenzaprine (FLEXERIL) 5 MG tablet    Sig: Take 1 tablet (5 mg total) by  mouth at bedtime.    Dispense:  30 tablet    Refill:  2   lisinopril (ZESTRIL) 20 MG tablet    Sig: Take 1 tablet (20 mg total) by mouth daily.    Dispense:  90 tablet    Refill:  1    Please DC any scripts for this med by other providers. New pcp   tirzepatide (MOUNJARO) 12.5 MG/0.5ML Pen    Sig: Inject 12.5 mg into the skin once a week.    Dispense:  6 mL    Refill:  1   Referral Orders  No referral(s) requested today     Note is dictated utilizing voice recognition software. Although note has been proof read prior to signing, occasional typographical errors still can be missed. If any questions arise, please do not hesitate to call for verification.  Electronically signed by: Howard Pouch, DO [place]

## 2022-12-23 NOTE — Patient Instructions (Signed)
Return in about 15 weeks (around 04/07/2023) for Routine chronic condition follow-up.        Great to see you today.  I have refilled the medication(s) we provide.   If labs were collected, we will inform you of lab results once received either by echart message or telephone call.   - echart message- for normal results that have been seen by the patient already.   - telephone call: abnormal results or if patient has not viewed results in their echart.

## 2023-01-17 ENCOUNTER — Encounter: Payer: Self-pay | Admitting: Gastroenterology

## 2023-01-23 ENCOUNTER — Encounter: Payer: Self-pay | Admitting: Gastroenterology

## 2023-03-07 ENCOUNTER — Encounter: Payer: Self-pay | Admitting: Gastroenterology

## 2023-03-07 ENCOUNTER — Ambulatory Visit (INDEPENDENT_AMBULATORY_CARE_PROVIDER_SITE_OTHER): Payer: 59 | Admitting: Gastroenterology

## 2023-03-07 VITALS — BP 134/84 | HR 74 | Ht 65.0 in | Wt 160.0 lb

## 2023-03-07 DIAGNOSIS — K50819 Crohn's disease of both small and large intestine with unspecified complications: Secondary | ICD-10-CM

## 2023-03-07 NOTE — Progress Notes (Signed)
03/07/2023 Angela [name] [id number] [birth year]  1. Crohns ileocolitis: Diagnosed 2019 with a inflammatory process in the right lower quadrant involving the cecum and terminal ileum.  Eventual colonoscopy with Dr. Leary Roca April 2019 showed "congested, red, pseudopolypoid and scar area in the ascending colon, the proximal ascending colon and the IC valve, congested mucosa in the distal sigmoid" endoscopically this was suspicious for Crohn's disease.  Pathology showed erosion, focal active cryptitis and mild crypt or distortion.  No granulomas.  She had a hyperplastic polyp removed.  [facility] IBD testing was "positive for Crohn's disease".  She was evaluated by Dr. Angelena Form at [facility] surgery he recommended second opinion GI and she transferred her care to [facility].  I did feel that she had Crohn's ileocolitis and started her on Humira June 2019.  Colonoscopy February 2021 Dr. Christella Hartigan TI was stenotic with a pinpoint lumen which could not be intubated..  Just distal to the IC valve there was a frond-like mucosa though suspicious for colocolonic fistula described on recent CT.  Right colon was normal-appearing, left colon was normal except for a 10 cm segment that appeared congested and edematous.  Pathology showed no active inflammation or chronic changes. Humira start June 2019: Significant side effects including extreme myalgias, double vision, significant injection site pain, flulike symptoms.  Changed to stelara 08/2018 TB quant gold negative June 2019 Hepatitis B surface antibody and antigen were -June 2019 Pneumococcal immunizations started June 2019 with Prevnar 13. Stelara trough drug level 2.1ug/mL, no circulating antibodies (blood work August 2020); began process of every 6 week frequency dosing change.  Started every 6 weeks Stelara October 2020.  Trough level January 2021 greater than 10 ug/mL Lap assisted ileocolonic resection, lysis of adhesion April 2021 Dr. 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  Biopsies from her neoterminal ileum were essentially normal, random biopsies from the colon were all normal. MR enterography 12/2021 no sign of active inflammation, also sed rate, CRP, fecal calprotectin were all normal, GI pathogen was normal.  Multidisciplinary care with Dr. Cliffton Asters: We decided to try increasing her Crohn's therapy by adding immune modulator azathioprine.  TPMT enzyme activity was normal Transaminases elevated shortly after starting her azathioprine.  Stopped this medicine July 2023 2.  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   3.  C. difficile infection: Presented with urgency, frequency.  January 2022 Stelara antibodies negative, Stelara drug level appropriate (12).  Proven by toxin PCR January 2022, remainder of GI path panel was negative.  Treated with vancomycin 125 mg p.o. 4 times daily for 2 weeks.   Recurrent, untreated C. difficile positive again by PCR toxin March 2022; started dificid 200 BID for 10 days     HISTORY OF PRESENT ILLNESS:  This is a 67 year old female who was a patient of Dr. Christella Hartigan.  She has Crohn's ileocolitis and has been on Stelara every 6 weeks for quite some time.  She has been feeling great since July 2023.  I saw her in October 2023.  She is up-to-date with all of her labs, all looks well.  She is here for a 6 month follow-up.  Has a minimal right sided abdominal pain, but she does not necessarily think that it is her Crohn's disease as she has no other symptoms and  feels well.  Is on Mounjaro and that has caused some mild constipation, but nothing that has required treatment.  Just of note, she had been referred her to [facility] to see Dr. Lilli Light last year (summer  2023) because she was still having GI symptoms and everything was looking ok.  She was not able to get an appointment until early December and the appt was not even with him specifically.  Very shortly after she saw Dr. Christella Hartigan in July 2023 though she started to feel better and she has felt great since then so she cancelled that appt and was not seen there.   Past Medical History:  Diagnosis Date   C. difficile diarrhea    Crohn's colitis    Cyclical vomiting syndrome 03/21/2022   Diabetes mellitus without complication    Diplopia 05/05/2022   Family history of breast cancer    Hyperlipidemia    Hypertension    Intra-abdominal phelgmon (RLQ) 03/08/2018   PONV (postoperative nausea and vomiting)    Past Surgical History:  Procedure Laterality Date   APPENDECTOMY     BIOPSY  04/29/2021   Procedure: BIOPSY;  Surgeon: Rachael Fee, MD;  [facility];  Service: Endoscopy;;   CESAREAN SECTION     x2   CHOLECYSTECTOMY  2011   COLONOSCOPY  12/2019   COLONOSCOPY WITH PROPOFOL N/A 04/29/2021   Procedure: COLONOSCOPY WITH PROPOFOL;  Surgeon: Rachael Fee, MD;  [facility];  Service: Endoscopy;  Laterality: N/A;   FLEXIBLE SIGMOIDOSCOPY  03/04/2020   Procedure: DIAGNOSTIC FLEXIBLE SIGMOIDOSCOPY;  Surgeon: Andria Meuse, MD;  [facility];  Service: General;;   KNEE SURGERY Right    LAPAROSCOPIC SUBTOTAL COLECTOMY N/A 03/04/2020   Procedure: LAPAROSCOPIC ILEOCOLECTOMY, LYSIS OF ADHESIONS, BILATERAL TAP BLOCK;  Surgeon: Andria Meuse, MD;  [facility];  Service: General;  Laterality: N/A;   spinal ablation  05/23/2022   SPINE SURGERY     Herniated Disc x 2   tummy tuck  2001    reports that she has quit smoking. Her smoking use included cigarettes. She has never used smokeless tobacco. She reports current alcohol use. She reports that she does not use drugs. family history includes Alcohol abuse in her father; Breast cancer in her maternal  grandmother and mother; Breast cancer (age of onset: 21) in her maternal aunt; Cancer in her father, maternal grandfather, and sister; Cervical cancer in her sister; Depression in her father and sister; Diabetes in her mother; Esophageal cancer in her father; Hearing loss in her mother; Heart attack in her paternal grandfather; Heart failure in her mother; Hypertension in her father and mother; Kidney disease in her mother; Leukemia in her mother; Leukemia (age of onset: 5) in her sister. Allergies  Allergen Reactions   Humira [Adalimumab] Other (See Comments)    Joints aches/double vision/flu-like symptoms.   Azathioprine Other (See Comments)   Penicillins Hives    Has patient had a PCN reaction causing immediate rash, facial/tongue/throat swelling, SOB or lightheadedness with hypotension: Yes Has patient had a PCN reaction causing severe rash involving mucus membranes or skin necrosis: No Has patient had a PCN reaction that required hospitalization: No Has patient had a PCN reaction occurring within the last 10 years: No If all of the above answers are "NO", then may proceed with Cephalosporin use.       Outpatient Encounter Medications as of 03/07/2023  Medication Sig   atorvastatin (LIPITOR) 40 MG tablet Take 1 tablet (40  mg total) by mouth daily.   cyclobenzaprine (FLEXERIL) 5 MG tablet Take 1 tablet (5 mg total) by mouth at bedtime. (Patient taking differently: Take 5 mg by mouth daily as needed.)   lisinopril (ZESTRIL) 20 MG tablet Take 1 tablet (20 mg total) by mouth daily.   STELARA 90 MG/ML SOSY injection INJECT 1 SYRINGE SUBCUTANEOUSLY  EVERY 6 WEEKS   tirzepatide (MOUNJARO) 12.5 MG/0.5ML Pen Inject 12.5 mg into the skin once a week.   XIIDRA 5 % SOLN Apply 1 drop to eye 2 (two) times daily.   [DISCONTINUED] tirzepatide Greggory Keen) 10 MG/0.5ML Pen 0.5 mL Subcutaneous Once a week for 30 days   No facility-administered encounter medications on file as of 03/07/2023.     REVIEW OF  SYSTEMS  : All other systems reviewed and negative except where noted in the History of Present Illness.   PHYSICAL EXAM: BP 134/84   Pulse 74   Ht  (1.651 m)   Wt 160 lb (72.6 kg)   BMI 26.63 kg/m  General: Well developed white female in no acute distress Head: Normocephalic and atraumatic Eyes:  Sclerae anicteric, conjunctiva pink. Ears: Normal auditory acuity Lungs: Clear throughout to auscultation; no W/R/R. Heart: Regular rate and rhythm; no M/R/G. Abdomen: Soft, non-distended.  BS present.  Minimal right sided TTP. Musculoskeletal: Symmetrical with no gross deformities  Skin: No lesions on visible extremities Extremities: No edema  Neurological: Alert oriented x 4, grossly non-focal Psychological:  Alert and cooperative. Normal mood and affect  ASSESSMENT AND PLAN: 67 year old female with Crohn's ileocolitis: Has actually been doing very well since last July.  She is on Stelara injections ever 6 weeks.  She will continue Stelara at her current dose for now.  She will follow-up in 6 months or sooner if needed.  Will need labs at that time.   CC:  Kuneff, Renee A, DO

## 2023-03-07 NOTE — Patient Instructions (Signed)
_______________________________________________________  If your blood pressure at your visit was 140/90 or greater, please contact your primary care physician to follow up on this.  _______________________________________________________  If you are age 65 or older, your body mass index should be between 23-30. Your Body mass index is 26.63 kg/m. If this is out of the aforementioned range listed, please consider follow up with your Primary Care Provider.  If you are age 89 or younger, your body mass index should be between 19-25. Your Body mass index is 26.63 kg/m. If this is out of the aformentioned range listed, please consider follow up with your Primary Care Provider.   ________________________________________________________  The [place] [facility] providers would like to encourage you to use [facility] to communicate with providers for non-urgent requests or questions.  Due to long hold times on the telephone, sending your provider a message by [facility] may be a faster and more efficient way to get a response.  Please allow 48 business hours for a response.  Please remember that this is for non-urgent requests.  _______________________________________________________  We will call you regarding your 6 month follow up appointment. But follow up as needed. Please call with any questions or concerns in the meantime to [phone]   It was a pleasure to see you today!  Thank you for trusting me with your gastrointestinal care!

## 2023-03-13 ENCOUNTER — Encounter: Payer: Self-pay | Admitting: Family Medicine

## 2023-03-13 NOTE — Telephone Encounter (Signed)
Noted  

## 2023-04-03 ENCOUNTER — Encounter: Payer: Self-pay | Admitting: Family Medicine

## 2023-04-03 ENCOUNTER — Ambulatory Visit (INDEPENDENT_AMBULATORY_CARE_PROVIDER_SITE_OTHER): Payer: 59 | Admitting: Family Medicine

## 2023-04-03 VITALS — BP 109/74 | HR 83 | Temp 97.7°F | Wt 154.2 lb

## 2023-04-03 DIAGNOSIS — E782 Mixed hyperlipidemia: Secondary | ICD-10-CM

## 2023-04-03 DIAGNOSIS — E1169 Type 2 diabetes mellitus with other specified complication: Secondary | ICD-10-CM | POA: Diagnosis not present

## 2023-04-03 DIAGNOSIS — Z7985 Long-term (current) use of injectable non-insulin antidiabetic drugs: Secondary | ICD-10-CM

## 2023-04-03 LAB — POCT GLYCOSYLATED HEMOGLOBIN (HGB A1C)
HbA1c POC (<> result, manual entry): 5.2 % (ref 4.0–5.6)
HbA1c, POC (controlled diabetic range): 5.2 % (ref 0.0–7.0)
HbA1c, POC (prediabetic range): 5.2 % — AB (ref 5.7–6.4)
Hemoglobin A1C: 5.2 % (ref 4.0–5.6)

## 2023-04-03 MED ORDER — ATORVASTATIN CALCIUM 40 MG PO TABS: 40.0000 mg | ORAL_TABLET | Freq: Every day | ORAL | 1 refills | Status: DC

## 2023-04-03 MED ORDER — LISINOPRIL 20 MG PO TABS: 20.0000 mg | ORAL_TABLET | Freq: Every day | ORAL | 1 refills | Status: DC

## 2023-04-03 MED ORDER — TIRZEPATIDE 12.5 MG/0.5ML ~~LOC~~ SOAJ: 12.5000 mg | SUBCUTANEOUS | 1 refills | Status: DC

## 2023-04-03 NOTE — Patient Instructions (Signed)
No follow-ups on file.        Great to see you today.  I have refilled the medication(s) we provide.   If labs were collected, we will inform you of lab results once received either by echart message or telephone call.   - echart message- for normal results that have been seen by the patient already.   - telephone call: abnormal results or if patient has not viewed results in their echart.  

## 2023-04-03 NOTE — Progress Notes (Signed)
Patient ID: Angela [name], female  DOB: 04/18/1956, 67 y.o.   MRN: [id number] Patient Care Team    Relationship Specialty Notifications Start End  Natalia Leatherwood, DO PCP - General Family Medicine  12/02/22   [facility] ophthamology Consulting Physician Ophthalmology  05/06/21   Rachael Fee, MD Attending Physician Gastroenterology  12/02/22   Keith Rake, MD Referring Physician Neurosurgery  12/02/22   Levert Feinstein, MD Consulting Physician Neurology  12/02/22     Chief Complaint  Patient presents with   Diabetes   Hypertension    Subjective: Angela [name] is a 67 y.o.  female present for Chronic Conditions/illness Management  All past medical history, surgical history, allergies, family history, immunizations, medications and social history were updated in the electronic medical record today. All recent labs, ED visits and hospitalizations within the last year were reviewed.  Type 2 diabetes mellitus with stage 3a chronic kidney disease, without long-term current use of insulin (HCC) Patient reports compliance with Mounjaro 12.5 mg weekly. Patient denies dizziness, hyperglycemic or hypoglycemic events. Patient denies numbness, tingling in the extremities or nonhealing wounds of feet.   Primary hypertension/Mixed hyperlipidemia/Hardening of the aorta (main artery of the heart) (HCC) Pt reports compliance  with lisinopril 20 mg daily.  Patient denies chest pain, shortness of breath, dizziness or lower extremity edema.  RF: Hypertension, hyperlipidemia, diabetes  Crohn's disease of small and large intestines with complication (HCC) Managed by Dr. Christella Hartigan. Prescribed Stelara.  Headaches: Pt reports her headaches are improving. She did start a b12 supplement . She is seeing improvement in her headaches.      04/03/2023   10:23 AM 12/02/2022   11:02 AM 08/24/2020    2:56 PM 02/06/2018    9:07 AM  Depression screen PHQ 2/9  Decreased Interest 0 0 0 0  Down, Depressed,  Hopeless 0 0 0 0  PHQ - 2 Score 0 0 0 0       No data to display                04/03/2023   10:23 AM 08/24/2020    2:56 PM 02/06/2018    9:07 AM  Fall Risk   Falls in the past year? 0 0 No  Number falls in past yr: 0    Injury with Fall? 0    Risk for fall due to : No Fall Risks    Follow up Falls evaluation completed     Immunization History  Administered Date(s) Administered   Pneumococcal Conjugate-13 05/14/2018    No results found.  Past Medical History:  Diagnosis Date   C. difficile diarrhea    Crohn's colitis (HCC)    Cyclical vomiting syndrome 03/21/2022   Diabetes mellitus without complication (HCC)    Diplopia 05/05/2022   Family history of breast cancer    Hyperlipidemia    Hypertension    Intra-abdominal phelgmon (RLQ) 03/08/2018   PONV (postoperative nausea and vomiting)    Allergies  Allergen Reactions   Humira [Adalimumab] Other (See Comments)    Joints aches/double vision/flu-like symptoms.   Azathioprine Other (See Comments)   Penicillins Hives    Has patient had a PCN reaction causing immediate rash, facial/tongue/throat swelling, SOB or lightheadedness with hypotension: Yes Has patient had a PCN reaction causing severe rash involving mucus membranes or skin necrosis: No Has patient had a PCN reaction that required hospitalization: No Has patient had a PCN reaction occurring within the last 10 years: No  If all of the above answers are "NO", then may proceed with Cephalosporin use.    Past Surgical History:  Procedure Laterality Date   APPENDECTOMY     BIOPSY  04/29/2021   Procedure: BIOPSY;  Surgeon: Rachael Fee, MD;  Location: [facility];  Service: Endoscopy;;   CESAREAN SECTION     x2   CHOLECYSTECTOMY  2011   COLONOSCOPY  12/2019   COLONOSCOPY WITH PROPOFOL N/A 04/29/2021   Procedure: COLONOSCOPY WITH PROPOFOL;  Surgeon: Rachael Fee, MD;  [facility]: [facility];  Service: Endoscopy;  Laterality: N/A;   FLEXIBLE  SIGMOIDOSCOPY  03/04/2020   Procedure: DIAGNOSTIC FLEXIBLE SIGMOIDOSCOPY;  Surgeon: Andria Meuse, MD;  Location: [facility];  Service: General;;   KNEE SURGERY Right    LAPAROSCOPIC SUBTOTAL COLECTOMY N/A 03/04/2020   Procedure: LAPAROSCOPIC ILEOCOLECTOMY, LYSIS OF ADHESIONS, BILATERAL TAP BLOCK;  Surgeon: Andria Meuse, MD;  Location: [facility];  Service: General;  Laterality: N/A;   spinal ablation  05/23/2022   SPINE SURGERY     Herniated Disc x 2   tummy tuck  2001   Family History  Problem Relation Age of Onset   Leukemia Mother    Heart failure Mother    Hypertension Mother    Diabetes Mother    Breast cancer Mother    Hearing loss Mother    Kidney disease Mother    Hypertension Father    Cancer Father    Esophageal cancer Father    Depression Father    Alcohol abuse Father    Depression Sister    Cancer Sister    Leukemia Sister 5   Cervical cancer Sister    Breast cancer Maternal Grandmother        dx in her late 62s   Cancer Maternal Grandfather    Heart attack Paternal Grandfather    Breast cancer Maternal Aunt 49   Colon cancer Neg Hx    Liver cancer Neg Hx    Stomach cancer Neg Hx    Social History   Social History Narrative   Marital status/children/pets: married, 3 children   Education/employment: masters degree-retired   Safety:      -smoke alarm in the home:Yes     - wears seatbelt: Yes     - Feels safe in their relationships: Yes      Uses hearing aid       Allergies as of 04/03/2023       Reactions   Humira [adalimumab] Other (See Comments)   Joints aches/double vision/flu-like symptoms.   Azathioprine Other (See Comments)   Penicillins Hives   Has patient had a PCN reaction causing immediate rash, facial/tongue/throat swelling, SOB or lightheadedness with hypotension: Yes Has patient had a PCN reaction causing severe rash involving mucus membranes or skin necrosis: No Has patient had a PCN reaction that required hospitalization:  No Has patient had a PCN reaction occurring within the last 10 years: No If all of the above answers are "NO", then may proceed with Cephalosporin use.        Medication List        Accurate as of Apr 03, 2023 10:36 AM. If you have any questions, ask your nurse or doctor.          STOP taking these medications    cyclobenzaprine 5 MG tablet Commonly known as: FLEXERIL Stopped by: Felix Pacini, DO       TAKE these medications    atorvastatin 40 MG tablet Commonly known  as: LIPITOR Take 1 tablet (40 mg total) by mouth daily.   lisinopril 20 MG tablet Commonly known as: ZESTRIL Take 1 tablet (20 mg total) by mouth daily.   Stelara 90 MG/ML Sosy injection Generic drug: ustekinumab INJECT 1 SYRINGE SUBCUTANEOUSLY  EVERY 6 WEEKS   tirzepatide 12.5 MG/0.5ML Pen Commonly known as: MOUNJARO Inject 12.5 mg into the skin once a week.   Xiidra 5 % Soln Generic drug: Lifitegrast Apply 1 drop to eye 2 (two) times daily.        All past medical history, surgical history, allergies, family history, immunizations andmedications were updated in the EMR today and reviewed under the history and medication portions of their EMR.       ROS 14 pt review of systems performed and negative (unless mentioned in an HPI)  Objective: BP 109/74   Pulse 83   Temp 97.7 F (36.5 C)   Wt 154 lb 3.2 oz (69.9 kg)   SpO2 99%   BMI 25.66 kg/m  Physical Exam Vitals and nursing note reviewed.  Constitutional:      General: She is not in acute distress.    Appearance: Normal appearance. She is not ill-appearing, toxic-appearing or diaphoretic.  HENT:     Head: Normocephalic and atraumatic.  Eyes:     General: No scleral icterus.       Right eye: No discharge.        Left eye: No discharge.     Extraocular Movements: Extraocular movements intact.     Conjunctiva/sclera: Conjunctivae normal.     Pupils: Pupils are equal, round, and reactive to light.  Cardiovascular:     Rate  and Rhythm: Normal rate and regular rhythm.  Pulmonary:     Effort: Pulmonary effort is normal. No respiratory distress.     Breath sounds: Normal breath sounds. No wheezing, rhonchi or rales.  Musculoskeletal:     Right lower leg: No edema.     Left lower leg: No edema.  Skin:    General: Skin is warm.     Findings: No rash.  Neurological:     Mental Status: She is alert and oriented to person, place, and time. Mental status is at baseline.  Psychiatric:        Mood and Affect: Mood normal.        Behavior: Behavior normal.        Thought Content: Thought content normal.        Judgment: Judgment normal.     Assessment/plan: Angela [name] is a 67 y.o. female present for cmc DM type 2 with diabetic mixed hyperlipidemia (HCC) Stable Continue mounjaro 12.5 mg weekly PNA series: will get after 67 Flu shot: declined (recommneded yearly) BMP: UTD 10/2022 Microalb: UTD 10/2022 Foot exam: completed 12/23/2022 Eye exam: 12/01/2022, Dr. Manley Mason oph.  A1c: 5.1> 5.2 today  Primary hypertension/hyperlipidemia/atherosclerosis of the aorta Stable continue lisinopril 20 mg daily Continue  atorvastatin 40 mg daily  Crohn's disease of small and large intestines with complication (HCC) Managed by GI. Continue Stelara  Cerebrovascular small vessel disease Incidental finding recent MRI through neurology  Increased frequency of headaches improved Continue B complex vitamin.   Continue low dose Flexeril nightly prn only if desired  Return in about 24 weeks (around 09/18/2023) for cpe (20 min), Routine chronic condition follow-up.  Orders Placed This Encounter  Procedures   POCT HgB A1C   Meds ordered this encounter  Medications   lisinopril (ZESTRIL) 20 MG tablet  Sig: Take 1 tablet (20 mg total) by mouth daily.    Dispense:  90 tablet    Refill:  1   tirzepatide (MOUNJARO) 12.5 MG/0.5ML Pen    Sig: Inject 12.5 mg into the skin once a week.    Dispense:  6 mL    Refill:  1    atorvastatin (LIPITOR) 40 MG tablet    Sig: Take 1 tablet (40 mg total) by mouth daily.    Dispense:  90 tablet    Refill:  1   Referral Orders  No referral(s) requested today     Note is dictated utilizing voice recognition software. Although note has been proof read prior to signing, occasional typographical errors still can be missed. If any questions arise, please do not hesitate to call for verification.  Electronically signed by: Felix Pacini, DO [facility]

## 2023-04-07 ENCOUNTER — Encounter: Payer: Self-pay | Admitting: Genetic Counselor

## 2023-04-07 ENCOUNTER — Ambulatory Visit: Payer: 59 | Admitting: Family Medicine

## 2023-04-07 NOTE — Progress Notes (Signed)
Update: The VUS MSH2 c.1892G>A ([id number]) has been amended to Likely Benign. The amended report date is March 20, 2023.

## 2023-08-14 ENCOUNTER — Other Ambulatory Visit: Payer: Self-pay | Admitting: Family Medicine

## 2023-08-25 ENCOUNTER — Other Ambulatory Visit: Payer: Self-pay | Admitting: Gastroenterology

## 2023-08-25 NOTE — Telephone Encounter (Signed)
Patty this was a Christella Hartigan Patient and was assigned to Dr Lavon Paganini after seeing Doug Sou, but has now switched to Dr Meridee Score with him agreeing. Is this to be refilled under Dr Meridee Score?

## 2023-08-30 ENCOUNTER — Other Ambulatory Visit: Payer: 59

## 2023-08-30 ENCOUNTER — Ambulatory Visit: Payer: 59 | Admitting: Gastroenterology

## 2023-08-30 ENCOUNTER — Encounter: Payer: Self-pay | Admitting: Gastroenterology

## 2023-08-30 VITALS — BP 110/74 | HR 76 | Ht 65.0 in | Wt 148.0 lb

## 2023-08-30 DIAGNOSIS — Z79899 Other long term (current) drug therapy: Secondary | ICD-10-CM

## 2023-08-30 DIAGNOSIS — R1030 Lower abdominal pain, unspecified: Secondary | ICD-10-CM

## 2023-08-30 DIAGNOSIS — K50819 Crohn's disease of both small and large intestine with unspecified complications: Secondary | ICD-10-CM

## 2023-08-30 DIAGNOSIS — D84821 Immunodeficiency due to drugs: Secondary | ICD-10-CM

## 2023-08-30 DIAGNOSIS — K50919 Crohn's disease, unspecified, with unspecified complications: Secondary | ICD-10-CM

## 2023-08-30 NOTE — Progress Notes (Unsigned)
GASTROENTEROLOGY OUTPATIENT CLINIC VISIT   Primary Care Provider Natalia Leatherwood, [place] [address] [place] [address] [phone]   Patient Profile: Angela [name] is a 66 y.o. female with a pmh significant for Crohn's disease (status post previous ileocecectomy on Stelara), diabetes, hypertension, hyperlipidemia, prior C. difficile infection.  The patient presents to the [facility] for an evaluation and management of problem(s) noted below:  Problem List 1. Crohn's disease of small and large intestines with complication (HCC)   2. Lower abdominal pain   3. Immunosuppression due to drug therapy [facility])    Discussed the use of AI scribe software for clinical note transcription with the patient, who gave verbal consent to proceed.  History of Present Illness Please see prior GI notes for details of HPI.  Interval History The patient presents for follow-up.  She has been a previous patient of Dr. Christella Hartigan, and this is the first time I have seen her, but she has been seen by PA Zehr in the last few years.  She has a history of Crohn's disease and is s/p biologic therapy as well as prior ileocecectomy after internal fistulization had occurred years ago.  She presents with intermittent episodes of fatigue and lower abdominal discomfort. These episodes, described as 'flare-ups,' are characterized by significant fatigue that prompts longer periods of rest.  Episodes typically last for a day and have been occurring sporadically.  The patient does however relay that there has been some progressiveness to the discomfort over this last year.  The dullness that is more pronounced on the right side is almost a chronic dull ache. The pain on the left side is described as sharp and severe, often triggered by sneezing, and resolves within a few minutes.  She has not had any updated imaging in over a year.  The patient reports normal bowel movements that are well formed occurring 1-2  times daily.  No nocturnal bowel movements, urgency, blood, or mucus has been noted either.  The patient is currently on Stelara (Ustekinumab) every 6-weeks with good effect, if she goes longer than 6-weeks she will have more significant discomfort and can have alteration of her bowel habits.  She has been on this regimen for the last few years.  Her last colonoscopy in 2022 was unremarkable for active Crohn's disease.  Her last CT-Enterography is documented below.  The patient also reports being on Mounjaro for at least six months, but she does not associate any worsening of her symptoms with this, and she has only had a 5-10-pound weight loss with this medication.   GI Review of Systems Positive as above Negative for dysphagia, odynophagia, nausea, vomiting, melena, hematochezia  Review of Systems General: Denies fevers/chills/weight loss unintentionally HEENT: Denies oral lesions Cardiovascular: Denies chest pain Pulmonary: Denies shortness of breath Gastroenterological: See HPI Genitourinary: Denies darkened urine  Hematological: Denies easy bruising/bleeding Endocrine: Denies temperature intolerance Dermatological: Denies jaundice or skin rashes Psychological: Mood is stable Musculoskeletal: Denies new arthralgias   Medications Current Outpatient Medications  Medication Sig Dispense Refill   atorvastatin (LIPITOR) 40 MG tablet Take 1 tablet (40 mg total) by mouth daily. 90 tablet 1   lisinopril (ZESTRIL) 20 MG tablet Take 1 tablet (20 mg total) by mouth daily. 90 tablet 1   STELARA 90 MG/ML SOSY injection INJECT 1 SYRINGE SUBCUTANEOUSLY  EVERY 6 WEEKS 1 mL 6   tirzepatide (MOUNJARO) 12.5 MG/0.5ML Pen Inject 12.5 mg into the skin once a week. 6 mL 1  XIIDRA 5 % SOLN Apply 1 drop to eye 2 (two) times daily.     No current facility-administered medications for this visit.    Allergies Allergies  Allergen Reactions   Humira [Adalimumab] Other (See Comments)    Joints  aches/double vision/flu-like symptoms.   Azathioprine Other (See Comments)   Penicillins Hives    Has patient had a PCN reaction causing immediate rash, facial/tongue/throat swelling, SOB or lightheadedness with hypotension: Yes Has patient had a PCN reaction causing severe rash involving mucus membranes or skin necrosis: No Has patient had a PCN reaction that required hospitalization: No Has patient had a PCN reaction occurring within the last 10 years: No If all of the above answers are "NO", then may proceed with Cephalosporin use.     Histories Past Medical History:  Diagnosis Date   C. difficile diarrhea    Crohn's colitis (HCC)    Cyclical vomiting syndrome 03/21/2022   Diabetes mellitus without complication (HCC)    Diplopia 05/05/2022   Family history of breast cancer    Hyperlipidemia    Hypertension    Intra-abdominal phelgmon (RLQ) 03/08/2018   PONV (postoperative nausea and vomiting)    Past Surgical History:  Procedure Laterality Date   APPENDECTOMY     BIOPSY  04/29/2021   Procedure: BIOPSY;  Surgeon: Rachael Fee, MD;  [facility];  Service: Endoscopy;;   CESAREAN SECTION     x2   CHOLECYSTECTOMY  2011   COLONOSCOPY  12/2019   COLONOSCOPY WITH PROPOFOL N/A 04/29/2021   Procedure: COLONOSCOPY WITH PROPOFOL;  Surgeon: Rachael Fee, MD;  [facility];  Service: Endoscopy;  Laterality: N/A;   FLEXIBLE SIGMOIDOSCOPY  03/04/2020   Procedure: DIAGNOSTIC FLEXIBLE SIGMOIDOSCOPY;  Surgeon: Andria Meuse, MD;  [facility];  Service: General;;   KNEE SURGERY Right    LAPAROSCOPIC SUBTOTAL COLECTOMY N/A 03/04/2020   Procedure: LAPAROSCOPIC ILEOCOLECTOMY, LYSIS OF ADHESIONS, BILATERAL TAP BLOCK;  Surgeon: Andria Meuse, MD;  Location: [facility];  Service: General;  Laterality: N/A;   spinal ablation  05/23/2022   SPINE SURGERY     Herniated Disc x 2   tummy tuck  2001   Social History   Socioeconomic History   Marital  status: Married    Spouse name: Not on file   Number of children: 3   Years of education: 18   Highest education level: Master's degree (e.g., MA, MS, MEng, MEd, MSW, MBA)  Occupational History   Occupation: realtor  Tobacco Use   Smoking status: Former    Types: Cigarettes   Smokeless tobacco: Never  Vaping Use   Vaping status: Never Used  Substance and Sexual Activity   Alcohol use: Yes    Comment: occasionally   Drug use: Never   Sexual activity: Yes    Comment: married  Other Topics Concern   Not on file  Social History Narrative   Marital status/children/pets: married, 3 children   Education/employment: masters degree-retired   Safety:      -smoke alarm in the home:Yes     - wears seatbelt: Yes     - Feels safe in their relationships: Yes      Uses hearing aid      Social Determinants of Health   Financial Resource Strain: Low Risk  (04/02/2023)   Overall Financial Resource Strain (CARDIA)    Difficulty of Paying Living Expenses: Not hard at all  Food Insecurity: No Food Insecurity (04/02/2023)   Hunger Vital Sign  Worried About Programme researcher, broadcasting/film/video in the Last Year: Never true    Ran Out of Food in the Last Year: Never true  Transportation Needs: No Transportation Needs (04/02/2023)   PRAPARE - Administrator, Civil Service (Medical): No    Lack of Transportation (Non-Medical): No  Physical Activity: Insufficiently Active (04/02/2023)   Exercise Vital Sign    Days of Exercise per Week: 2 days    Minutes of Exercise per Session: 20 min  Stress: No Stress Concern Present (04/02/2023)   [employer] of Occupational Health - Occupational Stress Questionnaire    Feeling of Stress : Only a little  Social Connections: Moderately Integrated (04/02/2023)   Social Connection and Isolation Panel [NHANES]    Frequency of Communication with Friends and Family: Three times a week    Frequency of Social Gatherings with Friends and Family: Three times a week     Attends Religious Services: Never    Active Member of Clubs or Organizations: Yes    Attends Engineer, structural: More than 4 times per year    Marital Status: Married  Catering manager Violence: Not on file   Family History  Problem Relation Age of Onset   Leukemia Mother    Heart failure Mother    Hypertension Mother    Diabetes Mother    Breast cancer Mother    Hearing loss Mother    Kidney disease Mother    Hypertension Father    Cancer Father    Esophageal cancer Father    Depression Father    Alcohol abuse Father    Depression Sister    Cancer Sister    Leukemia Sister 5   Cervical cancer Sister    Breast cancer Maternal Aunt 80   Breast cancer Maternal Grandmother        dx in her late 1s   Cancer Maternal Grandfather    Heart attack Paternal Grandfather    Colon cancer Neg Hx    Stomach cancer Neg Hx    Inflammatory bowel disease Neg Hx    Liver disease Neg Hx    Pancreatic cancer Neg Hx    Rectal cancer Neg Hx    I have reviewed her medical, social, and family history in detail and updated the electronic medical record as necessary.    PHYSICAL EXAMINATION  BP 110/74   Pulse 76   Ht 5\' 5"  (1.651 m)   Wt 148 lb (67.1 kg)   BMI 24.63 kg/m  Wt Readings from Last 3 Encounters:  08/30/23 148 lb (67.1 kg)  04/03/23 154 lb 3.2 oz (69.9 kg)  03/07/23 160 lb (72.6 kg)  GEN: NAD, appears stated age, doesn't appear chronically ill PSYCH: Cooperative, without pressured speech EYE: Conjunctivae pink, sclerae anicteric ENT: MMM CV: Nontachycardic RESP: CTAB posteriorly, without wheezing GI: NABS, soft, NT/ND, surgical scars present, without rebound or guarding MSK/EXT: No significant lower extremity edema SKIN: No jaundice NEURO:  Alert & Oriented x 3, no focal deficits   REVIEW OF DATA  I reviewed the following data at the time of this encounter:  GI Procedures and Studies  No new procedures to review  Laboratory Studies  Reviewed those in  epic  Imaging Studies  February 2023 MRI enterography IMPRESSION: 1. Mild dilatation of the distal ileum leading up the cecum. No mucosal enhancement to suggest active Crohn's disease. 2. No evidence of fistula or abscess in the RIGHT lower quadrant. 3. No evidence of inflammatory bowel disease  in the more proximal ileum or jejunum. Stomach and duodenum normal. 4. No colonic fistula identified.   ASSESSMENT  Ms. Pearman is a 67 y.o. female with a pmh significant for Crohn's disease (status post previous ileocecectomy on Stelara), diabetes, hypertension, hyperlipidemia, prior C. difficile infection, family history esophagus cancer (father).  The patient is seen today for evaluation and management of:  1. Crohn's disease of small and large intestines with complication (HCC)   2. Lower abdominal pain   3. Immunosuppression due to drug therapy [facility])    The patient is hemodynamically stable.  Clinically, the etiology of her abdominal discomfort over the last year is not clearly defined as her having active Crohn's disease.  However, what has been documented over the last few years, is that every 8 weeks Stelara is not helpful for her and that she begins to have more significant symptoms towards the last 2 weeks of an 8-week regimen.  As such, she has been on a every 6 week Stelara dosing and she needs to continue this moving forward to maximize her chance of not going into a flare.  Additional imaging from a cross-sectional standpoint may be reasonable.  I wonder if this is adhesive disease within her abdomen that is causing her issues as this seems more musculoskeletal at times.  I am going to order a fecal calprotectin.  If this fecal calprotectin is elevated, then this may tell us that we do need to proceed with additional enterography imaging versus moving forward with a colonoscopy.  If the fecal calprotectin is normal but she continues to have issues then we will move forward with enterography.   Will plan therapeutic drug monitoring before patient's next Stelara injection.   All patient questions were answered to the best of my ability, and the patient agrees to the aforementioned plan of action with follow-up as indicated.   PLAN  Continue Stelara every 6 weeks as she has done for years Obtain fecal calprotectin -If fecal calprotectin is elevated, then we will consider performing updated colonoscopy as well as enterography -If fecal calprotectin is normal, with persisting symptoms, cross-sectional imaging of the abdomen and pelvis will be performed likely as an enterography Colonoscopy otherwise in 2025 QuantiFERON gold to be obtained with next set of labs Therapeutic drug monitoring of Stelara before next injection   Orders Placed This Encounter  Procedures   Calprotectin, Fecal    New Prescriptions   No medications on file   Modified Medications   No medications on file    Planned Follow Up No follow-ups on file.   Total Time in Face-to-Face and in Coordination of Care for patient including independent/personal interpretation/review of prior testing, medical history, examination, medication adjustment, communicating results with the patient directly, and documentation within the EHR is 30 minutes.  Corliss Parish, MD [facility] Office # [phone]

## 2023-08-30 NOTE — Patient Instructions (Signed)
Your provider has requested that you go to the basement level for lab work before leaving today. Press "B" on the elevator. The lab is located at the first door on the left as you exit the elevator.  You will need labs prior to your next Stelara Injection. Please have labs done prior to next injection.   _______________________________________________________  If your blood pressure at your visit was 140/90 or greater, please contact your primary care physician to follow up on this.  _______________________________________________________  If you are age 50 or older, your body mass index should be between 23-30. Your Body mass index is 24.63 kg/m. If this is out of the aforementioned range listed, please consider follow up with your Primary Care Provider.  If you are age 54 or younger, your body mass index should be between 19-25. Your Body mass index is 24.63 kg/m. If this is out of the aformentioned range listed, please consider follow up with your Primary Care Provider.   ________________________________________________________  The [facility] providers would like to encourage you to use [facility] to communicate with providers for non-urgent requests or questions.  Due to long hold times on the telephone, sending your provider a message by [facility] may be a faster and more efficient way to get a response.  Please allow 48 business hours for a response.  Please remember that this is for non-urgent requests.  _______________________________________________________  Due to recent changes in healthcare laws, you may see the results of your imaging and laboratory studies on MyChart before your provider has had a chance to review them.  We understand that in some cases there may be results that are confusing or concerning to you. Not all laboratory results come back in the same time frame and the provider may be waiting for multiple results in order to interpret others.  Please give [place] 48 hours in  order for your provider to thoroughly review all the results before contacting the office for clarification of your results.   Thank you for choosing me and [facility].  Dr. Meridee Score

## 2023-08-31 ENCOUNTER — Encounter: Payer: Self-pay | Admitting: Gastroenterology

## 2023-08-31 DIAGNOSIS — Z79899 Other long term (current) drug therapy: Secondary | ICD-10-CM | POA: Insufficient documentation

## 2023-08-31 DIAGNOSIS — R1030 Lower abdominal pain, unspecified: Secondary | ICD-10-CM

## 2023-08-31 HISTORY — DX: Lower abdominal pain, unspecified: R10.30

## 2023-09-04 ENCOUNTER — Other Ambulatory Visit: Payer: Self-pay

## 2023-09-04 DIAGNOSIS — R1030 Lower abdominal pain, unspecified: Secondary | ICD-10-CM

## 2023-09-04 DIAGNOSIS — K50819 Crohn's disease of both small and large intestine with unspecified complications: Secondary | ICD-10-CM

## 2023-09-06 LAB — CALPROTECTIN, FECAL: Calprotectin, Fecal: 116 ug/g (ref 0–120)

## 2023-09-07 ENCOUNTER — Encounter: Payer: Self-pay | Admitting: Gastroenterology

## 2023-09-08 NOTE — Telephone Encounter (Signed)
Dr Meridee Score ok to proceed with Colon in the [facility]?

## 2023-09-11 ENCOUNTER — Telehealth: Payer: Self-pay

## 2023-09-11 NOTE — Telephone Encounter (Signed)
Angela [name], last colonoscopy done at Hospital-based setting.  Let's just plan this in hospital. GM        Note   You routed conversation to Mansouraty, Netty Starring., MD3 days ago   You3 days ago    Dr Meridee Score ok to proceed with Colon in the [facility]?       Note   You routed conversation to You3 days ago   [name]  P [facility] Clinical Pool (supporting Lemar Lofty., MD)3 days ago    Hi [name]! Hope you are doing well. I am so saddened to hear the news about Dr Christella Hartigan. Please give him a hug for me if you see him.  Per doctor recommendation, I guess it's best to schedule a colposcopy :(    You  [name]3 days ago    Please see the message below from Dr Meridee Score this morning.  Let me know how you would like to proceed. Colonosopy vs CT scan.       [name], Please let the patient know that I have reviewed her fecal calprotectin. It is in the borderline status, and based on her having symptoms, I think the next steps are for [place] to consider colonoscopy versus moving forward with CT scan.  If we move forward with a CT scan I would recommend a CT enterography, but with the elevation I would favor moving forward with colonoscopy. We had discussed both of these potential options as per my clinic note. Repeat fecal calprotectin in 6 to 8 weeks. Also, be prepared for her Stelara prior authorization every 6 weeks as you have done with Dr. Christella Hartigan in the past. Please let me know what she decides to do. Thanks. GM    [name]  [facility] Clinical Pool (supporting Lemar Lofty., MD)4 days ago    I saw the test results and am wondering if the increase from 44 to 116 indicates that there is some inflammation. If so, what are the next steps? We only discussed what to do if the results came back negative. Thank you!

## 2023-09-11 NOTE — Telephone Encounter (Signed)
Angela [name], last colonoscopy done at Hospital-based setting.  Let's just plan this in hospital. GM

## 2023-09-12 ENCOUNTER — Other Ambulatory Visit: Payer: Self-pay

## 2023-09-12 DIAGNOSIS — K50819 Crohn's disease of both small and large intestine with unspecified complications: Secondary | ICD-10-CM

## 2023-09-12 MED ORDER — NA SULFATE-K SULFATE-MG SULF 17.5-3.13-1.6 GM/177ML PO SOLN: 1.0000 | Freq: Once | ORAL | 0 refills | Status: AC

## 2023-09-12 NOTE — Telephone Encounter (Signed)
Colon has been set up for 10/16/23 at 1145 am at [facility] with GM    All information has been sent to the pt  My Chart- pt preference

## 2023-09-12 NOTE — Telephone Encounter (Signed)
She has been set up at [facility] and is ok to proceed.

## 2023-09-12 NOTE — Telephone Encounter (Signed)
JN, Thanks for the update.  Patty, You can offer patient Hospital-based if she wishes as that is where she had her last colonoscopy, or she can be done in our [facility] based on Anesthesia review. Offer her both and see what she would like to do. Thanks. GM

## 2023-09-13 NOTE — Telephone Encounter (Signed)
Got it.

## 2023-09-18 ENCOUNTER — Other Ambulatory Visit (INDEPENDENT_AMBULATORY_CARE_PROVIDER_SITE_OTHER): Payer: 59

## 2023-09-18 DIAGNOSIS — R1030 Lower abdominal pain, unspecified: Secondary | ICD-10-CM | POA: Diagnosis not present

## 2023-09-18 DIAGNOSIS — K50819 Crohn's disease of both small and large intestine with unspecified complications: Secondary | ICD-10-CM

## 2023-09-18 LAB — COMPREHENSIVE METABOLIC PANEL
ALT: 22 U/L (ref 0–35)
AST: 17 U/L (ref 0–37)
Albumin: 4.3 g/dL (ref 3.5–5.2)
Alkaline Phosphatase: 61 U/L (ref 39–117)
BUN: 14 mg/dL (ref 6–23)
CO2: 27 meq/L (ref 19–32)
Calcium: 9.4 mg/dL (ref 8.4–10.5)
Chloride: 104 meq/L (ref 96–112)
Creatinine, Ser: 0.99 mg/dL (ref 0.40–1.20)
GFR: 60.01 mL/min (ref >60.00)
Glucose, Bld: 110 mg/dL — ABNORMAL HIGH (ref 70–99)
Potassium: 3.9 meq/L (ref 3.5–5.1)
Sodium: 140 meq/L (ref 135–145)
Total Bilirubin: 0.7 mg/dL (ref 0.2–1.2)
Total Protein: 6.6 g/dL (ref 6.0–8.3)

## 2023-09-18 LAB — C-REACTIVE PROTEIN: CRP: <1 mg/dL (ref 0.5–20.0)

## 2023-09-18 LAB — CBC
HCT: 42.9 % (ref 36.0–46.0)
Hemoglobin: 13.9 g/dL (ref 12.0–15.0)
MCHC: 32.4 g/dL (ref 30.0–36.0)
MCV: 90.1 fL (ref 78.0–100.0)
Platelets: 277 10*3/uL (ref 150.0–400.0)
RBC: 4.76 Mil/uL (ref 3.87–5.11)
RDW: 13 % (ref 11.5–15.5)
WBC: 6.6 10*3/uL (ref 4.0–10.5)

## 2023-09-18 LAB — SEDIMENTATION RATE: Sed Rate: 3 mm/h (ref 0–30)

## 2023-09-19 ENCOUNTER — Encounter: Payer: 59 | Admitting: Family Medicine

## 2023-09-20 LAB — QUANTIFERON-TB GOLD PLUS
Mitogen-NIL: >10 [IU]/mL
NIL: 0.21 [IU]/mL
QuantiFERON-TB Gold Plus: NEGATIVE
TB1-NIL: <0 [IU]/mL
TB2-NIL: <0 [IU]/mL

## 2023-09-27 ENCOUNTER — Encounter: Payer: Self-pay | Admitting: Family Medicine

## 2023-09-27 ENCOUNTER — Ambulatory Visit: Payer: 59 | Admitting: Family Medicine

## 2023-09-27 VITALS — BP 110/80 | HR 83 | Temp 97.7°F | Ht 65.75 in | Wt 143.0 lb

## 2023-09-27 DIAGNOSIS — Z1231 Encounter for screening mammogram for malignant neoplasm of breast: Secondary | ICD-10-CM

## 2023-09-27 DIAGNOSIS — E782 Mixed hyperlipidemia: Secondary | ICD-10-CM | POA: Diagnosis not present

## 2023-09-27 DIAGNOSIS — I700 Atherosclerosis of aorta: Secondary | ICD-10-CM | POA: Diagnosis not present

## 2023-09-27 DIAGNOSIS — Z7985 Long-term (current) use of injectable non-insulin antidiabetic drugs: Secondary | ICD-10-CM

## 2023-09-27 DIAGNOSIS — I10 Essential (primary) hypertension: Secondary | ICD-10-CM | POA: Diagnosis not present

## 2023-09-27 DIAGNOSIS — Z803 Family history of malignant neoplasm of breast: Secondary | ICD-10-CM | POA: Diagnosis not present

## 2023-09-27 DIAGNOSIS — Z79899 Other long term (current) drug therapy: Secondary | ICD-10-CM

## 2023-09-27 DIAGNOSIS — M8580 Other specified disorders of bone density and structure, unspecified site: Secondary | ICD-10-CM | POA: Diagnosis not present

## 2023-09-27 DIAGNOSIS — E1169 Type 2 diabetes mellitus with other specified complication: Secondary | ICD-10-CM | POA: Diagnosis not present

## 2023-09-27 DIAGNOSIS — E2839 Other primary ovarian failure: Secondary | ICD-10-CM

## 2023-09-27 DIAGNOSIS — Z23 Encounter for immunization: Secondary | ICD-10-CM | POA: Diagnosis not present

## 2023-09-27 DIAGNOSIS — D84821 Immunodeficiency due to drugs: Secondary | ICD-10-CM

## 2023-09-27 DIAGNOSIS — Z0000 Encounter for general adult medical examination without abnormal findings: Secondary | ICD-10-CM

## 2023-09-27 DIAGNOSIS — K50819 Crohn's disease of both small and large intestine with unspecified complications: Secondary | ICD-10-CM | POA: Diagnosis not present

## 2023-09-27 DIAGNOSIS — I679 Cerebrovascular disease, unspecified: Secondary | ICD-10-CM

## 2023-09-27 LAB — LIPID PANEL
Cholesterol: 164 mg/dL (ref 0–200)
HDL: 44 mg/dL (ref >39.00)
LDL Cholesterol: 94 mg/dL (ref 0–99)
NonHDL: 120.2
Total CHOL/HDL Ratio: 4
Triglycerides: 130 mg/dL (ref 0.0–149.0)
VLDL: 26 mg/dL (ref 0.0–40.0)

## 2023-09-27 LAB — TSH: TSH: 1.76 u[IU]/mL (ref 0.35–5.50)

## 2023-09-27 LAB — VITAMIN D 25 HYDROXY (VIT D DEFICIENCY, FRACTURES): VITD: 21.77 ng/mL — ABNORMAL LOW (ref 30.00–100.00)

## 2023-09-27 LAB — HEMOGLOBIN A1C: Hgb A1c MFr Bld: 5.6 % (ref 4.6–6.5)

## 2023-09-27 MED ORDER — ATORVASTATIN CALCIUM 40 MG PO TABS: 40.0000 mg | ORAL_TABLET | Freq: Every day | ORAL | 1 refills | Status: DC

## 2023-09-27 MED ORDER — TIRZEPATIDE 12.5 MG/0.5ML ~~LOC~~ SOAJ: 12.5000 mg | SUBCUTANEOUS | 1 refills | Status: DC

## 2023-09-27 MED ORDER — LISINOPRIL 20 MG PO TABS: 20.0000 mg | ORAL_TABLET | Freq: Every day | ORAL | 1 refills | Status: DC

## 2023-09-27 NOTE — Progress Notes (Signed)
Patient ID: Angela [name], female  DOB: 01-Sep-1956, 67 y.o.   MRN: [id number] Patient Care Team    Relationship Specialty Notifications Start End  Natalia Leatherwood, DO PCP - General Family Medicine  12/02/22   [facility] ophthamology Consulting Physician Ophthalmology  05/06/21   Keith Rake, MD Referring Physician Neurosurgery  12/02/22   Levert Feinstein, MD Consulting Physician Neurology  12/02/22   Napoleon Form, MD Consulting Physician Gastroenterology  09/27/23 09/27/23  Mansouraty, Netty Starring., MD Consulting Physician Gastroenterology  09/27/23     Chief Complaint  Patient presents with   Annual Exam    And chronic condition management appointment. Pt is fasting.    Subjective:  Angela [name] is a 67 y.o.  Female  present for CPE and chronic condition management combination appointment. All past medical history, surgical history, allergies, family history, immunizations, medications and social history were updated in the electronic medical record today. All recent labs, ED visits and hospitalizations within the last year were reviewed.  Health maintenance:  Colonoscopy: completed 04/29/2021, by [facility], has scheduled colonoscopy the end of this month 09/2023 with Dr. Meridee Score  Mammogram: completed: 10/04/2021-[facility] on [address].  Ordered for her today. Immunizations: tdap -declined, Influenza -declined (encouraged yearly), PNA20- completed today, zostavax #1 provided today, #2 at 6 mos follow up or after 3 mos by nurse visit.  Infectious disease screening: HIV and Hep C completed DEXA: Ordered-[facility] on [employer] device: None Oxygen use: None Patient has a Dental home. Hospitalizations/ED visits: Reviewed   Type 2 diabetes mellitus with stage 3a chronic kidney disease, without long-term current use of insulin (HCC) Patient reports compliance with Mounjaro 12.5 mg weekly. Patient denies dizziness, hyperglycemic or hypoglycemic events. Patient denies  numbness, tingling in the extremities or nonhealing wounds of feet.   Primary hypertension/Mixed hyperlipidemia/Hardening of the aorta (main artery of the heart) (HCC) Pt reports compliance with lisinopril 20 mg daily.  Patient denies chest pain, shortness of breath, dizziness or lower extremity edema.  RF: Hypertension, hyperlipidemia, diabetes  Crohn's disease of small and large intestines with complication (HCC) Managed by Dr. Christella Hartigan. Prescribed Stelara.  Headaches: She did start a b12 supplement . She is seeing improvement in her headaches.      09/27/2023    7:54 AM 04/03/2023   10:23 AM 12/02/2022   11:02 AM 08/24/2020    2:56 PM 02/06/2018    9:07 AM  Depression screen PHQ 2/9  Decreased Interest 0 0 0 0 0  Down, Depressed, Hopeless 0 0 0 0 0  PHQ - 2 Score 0 0 0 0 0       No data to display              Immunization History  Administered Date(s) Administered   PNEUMOCOCCAL CONJUGATE-20 09/27/2023   Pneumococcal Conjugate-13 05/14/2018   Zoster Recombinant(Shingrix) 09/27/2023    Past Medical History:  Diagnosis Date   C. difficile diarrhea    Crohn's colitis (HCC)    Cyclical vomiting syndrome 03/21/2022   Diabetes mellitus without complication (HCC)    Diplopia 05/05/2022   Family history of breast cancer    Hyperlipidemia    Hypertension    Intra-abdominal phelgmon (RLQ) 03/08/2018   PONV (postoperative nausea and vomiting)    Allergies  Allergen Reactions   Humira [Adalimumab] Other (See Comments)    Joints aches/double vision/flu-like symptoms.   Azathioprine Other (See Comments)   Penicillins Hives    Has patient had a  PCN reaction causing immediate rash, facial/tongue/throat swelling, SOB or lightheadedness with hypotension: Yes Has patient had a PCN reaction causing severe rash involving mucus membranes or skin necrosis: No Has patient had a PCN reaction that required hospitalization: No Has patient had a PCN reaction occurring within the last  10 years: No If all of the above answers are "NO", then may proceed with Cephalosporin use.    Past Surgical History:  Procedure Laterality Date   APPENDECTOMY     BIOPSY  04/29/2021   Procedure: BIOPSY;  Surgeon: Rachael Fee, MD;  Location: [facility];  Service: Endoscopy;;   CESAREAN SECTION     x2   CHOLECYSTECTOMY  2011   COLONOSCOPY  12/2019   COLONOSCOPY WITH PROPOFOL N/A 04/29/2021   Procedure: COLONOSCOPY WITH PROPOFOL;  Surgeon: Rachael Fee, MD;  Location: [facility];  Service: Endoscopy;  Laterality: N/A;   FLEXIBLE SIGMOIDOSCOPY  03/04/2020   Procedure: DIAGNOSTIC FLEXIBLE SIGMOIDOSCOPY;  Surgeon: Andria Meuse, MD;  Location: [facility];  Service: General;;   KNEE SURGERY Right    LAPAROSCOPIC SUBTOTAL COLECTOMY N/A 03/04/2020   Procedure: LAPAROSCOPIC ILEOCOLECTOMY, LYSIS OF ADHESIONS, BILATERAL TAP BLOCK;  Surgeon: Andria Meuse, MD;  Location: [facility];  Service: General;  Laterality: N/A;   spinal ablation  05/23/2022   SPINE SURGERY     Herniated Disc x 2   tummy tuck  2001   Family History  Problem Relation Age of Onset   Leukemia Mother    Heart failure Mother    Hypertension Mother    Diabetes Mother    Breast cancer Mother    Hearing loss Mother    Kidney disease Mother    Hypertension Father    Cancer Father    Esophageal cancer Father    Depression Father    Alcohol abuse Father    Depression Sister    Cancer Sister    Leukemia Sister 5   Cervical cancer Sister    Breast cancer Maternal Aunt 24   Breast cancer Maternal Grandmother        dx in her late 59s   Cancer Maternal Grandfather    Heart attack Paternal Grandfather    Colon cancer Neg Hx    Stomach cancer Neg Hx    Inflammatory bowel disease Neg Hx    Liver disease Neg Hx    Pancreatic cancer Neg Hx    Rectal cancer Neg Hx    Social History   Social History Narrative   Marital status/children/pets: married, 3 children   Education/employment: masters  degree-retired   Safety:      -smoke alarm in the home:Yes     - wears seatbelt: Yes     - Feels safe in their relationships: Yes      Uses hearing aid       Allergies as of 09/27/2023       Reactions   Humira [adalimumab] Other (See Comments)   Joints aches/double vision/flu-like symptoms.   Azathioprine Other (See Comments)   Penicillins Hives   Has patient had a PCN reaction causing immediate rash, facial/tongue/throat swelling, SOB or lightheadedness with hypotension: Yes Has patient had a PCN reaction causing severe rash involving mucus membranes or skin necrosis: No Has patient had a PCN reaction that required hospitalization: No Has patient had a PCN reaction occurring within the last 10 years: No If all of the above answers are "NO", then may proceed with Cephalosporin use.  Medication List        Accurate as of September 27, 2023  8:42 AM. If you have any questions, ask your nurse or doctor.          atorvastatin 40 MG tablet Commonly known as: LIPITOR Take 1 tablet (40 mg total) by mouth daily.   fluorouracil 5 % cream Commonly known as: EFUDEX Apply topically.   lisinopril 20 MG tablet Commonly known as: ZESTRIL Take 1 tablet (20 mg total) by mouth daily.   Stelara 90 MG/ML Sosy injection Generic drug: ustekinumab INJECT 1 SYRINGE SUBCUTANEOUSLY  EVERY 6 WEEKS   tirzepatide 12.5 MG/0.5ML Pen Commonly known as: MOUNJARO Inject 12.5 mg into the skin once a week.   Xiidra 5 % Soln Generic drug: Lifitegrast Apply 1 drop to eye 2 (two) times daily.       All past medical history, surgical history, allergies, family history, immunizations andmedications were updated in the EMR today and reviewed under the history and medication portions of their EMR.     MR BRAIN W WO CONTRAST Result Date: 06/09/2022 FINDINGS: The brain parenchyma shows mild age-related changes of chronic small vessel disease and minimal supratentorial cortical atrophy.  No  other structural lesion, tumor or infarct is noted.  Diffusion-weighted imaging is negative for acute ischemia.  SWI sequences do not show any microhemorrhages.  Subarachnoid space and ventricular system appear normal.  Cortical sulci and gyri show normal appearance.  Extra-axial brain structures appear normal.  Calvarium shows no abnormalities.  Orbits appear unremarkable.  Paranasal sinuses show mild chronic inflammatory changes.  The pituitary gland and cerebellar tonsils within normal.  Visualized portion of the upper cervical spine shows no abnormalities.  Flow-voids of large vessels of intracranial circulation appear to be patent.  Postcontrast images do not result in abnormal areas of enhancement.   ROS 14 pt review of systems performed and negative (unless mentioned in an HPI)  Objective:  BP 110/80   Pulse 83   Temp 97.7 F (36.5 C)   Ht 5' 5.75" (1.67 m)   Wt 143 lb (64.9 kg)   SpO2 97%   BMI 23.26 kg/m  Physical Exam Vitals and nursing note reviewed.  Constitutional:      General: She is not in acute distress.    Appearance: Normal appearance. She is not ill-appearing or toxic-appearing.  HENT:     Head: Normocephalic and atraumatic.     Right Ear: Tympanic membrane, ear canal and external ear normal. There is no impacted cerumen.     Left Ear: Tympanic membrane, ear canal and external ear normal. There is no impacted cerumen.     Nose: No congestion or rhinorrhea.     Mouth/Throat:     Mouth: Mucous membranes are moist.     Pharynx: Oropharynx is clear. No oropharyngeal exudate or posterior oropharyngeal erythema.  Eyes:     General: No scleral icterus.       Right eye: No discharge.        Left eye: No discharge.     Extraocular Movements: Extraocular movements intact.     Conjunctiva/sclera: Conjunctivae normal.     Pupils: Pupils are equal, round, and reactive to light.  Cardiovascular:     Rate and Rhythm: Normal rate and regular rhythm.     Pulses: Normal  pulses.     Heart sounds: Normal heart sounds. No murmur heard.    No friction rub. No gallop.  Pulmonary:     Effort: Pulmonary effort is  normal. No respiratory distress.     Breath sounds: Normal breath sounds. No stridor. No wheezing, rhonchi or rales.  Chest:     Chest wall: No tenderness.  Abdominal:     General: Abdomen is flat. Bowel sounds are normal. There is no distension.     Palpations: Abdomen is soft. There is no mass.     Tenderness: There is abdominal tenderness (chron's flare). There is no right CVA tenderness, left CVA tenderness, guarding or rebound.     Hernia: No hernia is present.  Musculoskeletal:        General: No swelling, tenderness or deformity. Normal range of motion.     Cervical back: Normal range of motion and neck supple. No rigidity or tenderness.     Right lower leg: No edema.     Left lower leg: No edema.  Lymphadenopathy:     Cervical: No cervical adenopathy.  Skin:    General: Skin is warm and dry.     Coloration: Skin is not jaundiced or pale.     Findings: No bruising, erythema, lesion or rash.  Neurological:     General: No focal deficit present.     Mental Status: She is alert and oriented to person, place, and time. Mental status is at baseline.     Cranial Nerves: No cranial nerve deficit.     Sensory: No sensory deficit.     Motor: No weakness.     Coordination: Coordination normal.     Gait: Gait normal.     Deep Tendon Reflexes: Reflexes normal.  Psychiatric:        Mood and Affect: Mood normal.        Behavior: Behavior normal.        Thought Content: Thought content normal.        Judgment: Judgment normal.     Diabetic Foot Exam - Simple   Simple Foot Form Diabetic Foot exam was performed with the following findings: Yes 09/27/2023  8:21 AM  Visual Inspection No deformities, no ulcerations, no other skin breakdown bilaterally: Yes Sensation Testing Intact to touch and monofilament testing bilaterally: Yes Pulse  Check Posterior Tibialis and Dorsalis pulse intact bilaterally: Yes Comments     No results found.  Assessment/plan: Angela [name] is a 67 y.o. female present for CPE and chronic condition management combination appointment DM type 2 with diabetic mixed hyperlipidemia (HCC) Stable Continue mounjaro 12.5 mg weekly PNA series: will get after 67-adminstered today Flu shot: declined(recommneded yearly) Microalb: Active today 09/27/2023 Foot exam: completed 09/27/2023 Eye exam: 12/01/2022, Dr. Manley Mason oph.  A1c: 5.1> 5.2> A1c collected today  Hemoglobin A1c - Lipid panel - Vitamin D (25 hydroxy)  Primary hypertension/hyperlipidemia/atherosclerosis of the aorta Stable Continue lisinopril 20 mg daily Continue atorvastatin 40 mg daily Lipids collected today  Crohn's disease of small and large intestines with complication (HCC)/Immunosuppression due to drug therapy (HCC) Managed by GI. Continue Stelara Vitamin D collected today  Cerebrovascular small vessel disease Incidental finding recent MRI through neurology  Increased frequency of headaches improved Continue B complex vitamin.   Continue low dose Flexeril nightly prn only if desired Breast cancer screening by mammogram/ Family history of breast cancer - MM 3D SCREENING MAMMOGRAM BILATERAL BREAST; Future Advanced bone age/Estrogen deficiency - DG Bone Density; Future - Vitamin D (25 hydroxy)  Need for shingles vaccine - Zoster, Recombinant (Shingrix)#1 - #2 at 6 mos cmc  Need for vaccination for pneumococcus Administered today   Routine general medical examination at  health care facility - TSH Patient was encouraged to exercise greater than 150 minutes a week. Patient was encouraged to choose a diet filled with fresh fruits and vegetables, and lean meats. AVS provided to patient today for education/recommendation on gender specific health and safety maintenance. Colonoscopy: completed 04/29/2021, by [facility], has  scheduled colonoscopy the end of this month 09/2023 with Dr. Meridee Score  Mammogram: completed: 10/04/2021-[facility] on [address].  Ordered for her today. Immunizations: tdap -declined, Influenza -declined (encouraged yearly), PNA20- completed today, zostavax #1 provided today, #2 at 6 mos follow up or after 3 mos by nurse visit.  Infectious disease screening: HIV and Hep C completed DEXA: Ordered-[facility] on [address] Return in about 24 weeks (around 03/13/2024) for Routine chronic condition follow-up.   Orders Placed This Encounter  Procedures   MM 3D SCREENING MAMMOGRAM BILATERAL BREAST   DG Bone Density   Zoster, Recombinant (Shingrix)   Pneumococcal conjugate vaccine 20-valent   Hemoglobin A1c   TSH   Lipid panel   Vitamin D (25 hydroxy)   Meds ordered this encounter  Medications   atorvastatin (LIPITOR) 40 MG tablet    Sig: Take 1 tablet (40 mg total) by mouth daily.    Dispense:  90 tablet    Refill:  1   lisinopril (ZESTRIL) 20 MG tablet    Sig: Take 1 tablet (20 mg total) by mouth daily.    Dispense:  90 tablet    Refill:  1   tirzepatide (MOUNJARO) 12.5 MG/0.5ML Pen    Sig: Inject 12.5 mg into the skin once a week.    Dispense:  6 mL    Refill:  1   Referral Orders  No referral(s) requested today     Electronically signed by: Felix Pacini, DO [facility] [place]

## 2023-09-27 NOTE — Patient Instructions (Addendum)

## 2023-10-02 LAB — USTEKINUMAB AND ANTI-USTEK AB
Anti-Ustekinumab Antibody: <40 ng/mL
Ustekinumab: 7.2 ug/mL

## 2023-10-02 LAB — SERIAL MONITORING

## 2023-10-09 ENCOUNTER — Encounter (HOSPITAL_COMMUNITY): Payer: Self-pay | Admitting: Gastroenterology

## 2023-10-16 ENCOUNTER — Ambulatory Visit (HOSPITAL_COMMUNITY): Payer: 59 | Admitting: Anesthesiology

## 2023-10-16 ENCOUNTER — Encounter (HOSPITAL_COMMUNITY): Payer: Self-pay | Admitting: Gastroenterology

## 2023-10-16 ENCOUNTER — Encounter (HOSPITAL_COMMUNITY)
Admission: RE | Disposition: A | Discharge status: Home / Self Care | Payer: Self-pay | Source: Home / Self Care | Attending: Gastroenterology

## 2023-10-16 ENCOUNTER — Other Ambulatory Visit: Payer: Self-pay

## 2023-10-16 ENCOUNTER — Ambulatory Visit (HOSPITAL_COMMUNITY)
Admission: RE | Admit: 2023-10-16 | Discharge: 2023-10-16 | Disposition: A | Discharge status: Home / Self Care | Payer: 59 | Attending: Gastroenterology | Admitting: Gastroenterology

## 2023-10-16 DIAGNOSIS — K6389 Other specified diseases of intestine: Secondary | ICD-10-CM

## 2023-10-16 DIAGNOSIS — Z87891 Personal history of nicotine dependence: Secondary | ICD-10-CM | POA: Insufficient documentation

## 2023-10-16 DIAGNOSIS — K635 Polyp of colon: Secondary | ICD-10-CM

## 2023-10-16 DIAGNOSIS — E119 Type 2 diabetes mellitus without complications: Secondary | ICD-10-CM | POA: Diagnosis not present

## 2023-10-16 DIAGNOSIS — K5730 Diverticulosis of large intestine without perforation or abscess without bleeding: Secondary | ICD-10-CM

## 2023-10-16 DIAGNOSIS — I10 Essential (primary) hypertension: Secondary | ICD-10-CM | POA: Insufficient documentation

## 2023-10-16 DIAGNOSIS — K641 Second degree hemorrhoids: Secondary | ICD-10-CM | POA: Diagnosis not present

## 2023-10-16 DIAGNOSIS — K642 Third degree hemorrhoids: Secondary | ICD-10-CM | POA: Insufficient documentation

## 2023-10-16 DIAGNOSIS — K5010 Crohn's disease of large intestine without complications: Secondary | ICD-10-CM | POA: Insufficient documentation

## 2023-10-16 DIAGNOSIS — K621 Rectal polyp: Secondary | ICD-10-CM | POA: Diagnosis not present

## 2023-10-16 DIAGNOSIS — R195 Other fecal abnormalities: Secondary | ICD-10-CM

## 2023-10-16 DIAGNOSIS — Z980 Intestinal bypass and anastomosis status: Secondary | ICD-10-CM | POA: Insufficient documentation

## 2023-10-16 DIAGNOSIS — K50819 Crohn's disease of both small and large intestine with unspecified complications: Secondary | ICD-10-CM

## 2023-10-16 DIAGNOSIS — Z1211 Encounter for screening for malignant neoplasm of colon: Secondary | ICD-10-CM

## 2023-10-16 DIAGNOSIS — K644 Residual hemorrhoidal skin tags: Secondary | ICD-10-CM | POA: Insufficient documentation

## 2023-10-16 DIAGNOSIS — Z860100 Personal history of colon polyps, unspecified: Secondary | ICD-10-CM

## 2023-10-16 HISTORY — PX: POLYPECTOMY: SHX5525

## 2023-10-16 HISTORY — PX: COLONOSCOPY WITH PROPOFOL: SHX5780

## 2023-10-16 HISTORY — PX: BIOPSY: SHX5522

## 2023-10-16 LAB — GLUCOSE, CAPILLARY: Glucose-Capillary: 73 mg/dL (ref 70–99)

## 2023-10-16 SURGERY — COLONOSCOPY WITH PROPOFOL
Anesthesia: Monitor Anesthesia Care

## 2023-10-16 MED ORDER — FENTANYL CITRATE (PF) 100 MCG/2ML IJ SOLN
INTRAMUSCULAR | Status: DC | PRN
  Administered 2023-10-16 (×2): 25 ug via INTRAVENOUS

## 2023-10-16 MED ORDER — PROPOFOL 500 MG/50ML IV EMUL
INTRAVENOUS | Status: DC | PRN
  Administered 2023-10-16: 40 mg via INTRAVENOUS
  Administered 2023-10-16 (×2): 50 mg via INTRAVENOUS
  Administered 2023-10-16: 60 mg via INTRAVENOUS
  Administered 2023-10-16: 150 ug/kg/min via INTRAVENOUS

## 2023-10-16 MED ORDER — PROPOFOL 1000 MG/100ML IV EMUL
INTRAVENOUS | Status: AC
  Filled 2023-10-16: qty 100

## 2023-10-16 MED ORDER — FENTANYL CITRATE (PF) 100 MCG/2ML IJ SOLN
INTRAMUSCULAR | Status: AC
  Filled 2023-10-16: qty 2

## 2023-10-16 MED ORDER — LIDOCAINE HCL (CARDIAC) PF 100 MG/5ML IV SOSY
PREFILLED_SYRINGE | INTRAVENOUS | Status: DC | PRN
  Administered 2023-10-16: 60 mg via INTRAVENOUS

## 2023-10-16 MED ORDER — PHENYLEPHRINE HCL (PRESSORS) 10 MG/ML IV SOLN
INTRAVENOUS | Status: DC | PRN
  Administered 2023-10-16: 80 ug via INTRAVENOUS
  Administered 2023-10-16: 160 ug via INTRAVENOUS

## 2023-10-16 MED ORDER — SODIUM CHLORIDE 0.9 % IV SOLN: INTRAVENOUS | Status: DC

## 2023-10-16 MED ORDER — SODIUM CHLORIDE 0.9 % IV SOLN: INTRAVENOUS | Status: DC | PRN

## 2023-10-16 SURGICAL SUPPLY — 20 items
ELECT REM PT RETURN 9FT ADLT (ELECTROSURGICAL)
ELECTRODE REM PT RTRN 9FT ADLT (ELECTROSURGICAL) IMPLANT
FCP BXJMBJMB 240X2.8X (CUTTING FORCEPS)
FLOOR PAD 36X40 (MISCELLANEOUS) ×2
FORCEPS BIOP RAD 4 LRG CAP 4 (CUTTING FORCEPS) IMPLANT
FORCEPS BXJMBJMB 240X2.8X (CUTTING FORCEPS) IMPLANT
INJECTOR/SNARE I SNARE (MISCELLANEOUS) IMPLANT
LUBRICANT JELLY 4.5OZ STERILE (MISCELLANEOUS) IMPLANT
MANIFOLD NEPTUNE II (INSTRUMENTS) IMPLANT
NDL SCLEROTHERAPY 25GX240 (NEEDLE) IMPLANT
NEEDLE SCLEROTHERAPY 25GX240 (NEEDLE)
PAD FLOOR 36X40 (MISCELLANEOUS) ×3 IMPLANT
PROBE APC STR FIRE (PROBE) IMPLANT
PROBE INJECTION GOLD 7FR (MISCELLANEOUS) IMPLANT
SNARE ROTATE MED OVAL 20MM (MISCELLANEOUS) IMPLANT
SYR 50ML LL SCALE MARK (SYRINGE) IMPLANT
TRAP SPECIMEN MUCOUS 40CC (MISCELLANEOUS) IMPLANT
TUBING ENDO SMARTCAP PENTAX (MISCELLANEOUS) IMPLANT
TUBING IRRIGATION ENDOGATOR (MISCELLANEOUS) ×3 IMPLANT
WATER STERILE IRR 1000ML POUR (IV SOLUTION) IMPLANT

## 2023-10-16 NOTE — Op Note (Signed)
[facility] Patient Name: Angela [name] Procedure Date: 10/16/2023 MRN: [id number] Attending MD: Corliss Parish , MD, [id number] Date of Birth: June 12, 1956 CSN: [id number] Age: 67 Admit Type: Outpatient Procedure:                Colonoscopy Indications:              Lower abdominal pain, Follow-up of Crohn's disease                            of the colon, Disease activity assessment of                            Crohn's disease of the colon, Assess therapeutic                            response to therapy of Crohn's disease of the                            colon, Elevated fecal calprotectin Providers:                Corliss Parish, MD, Fransisca Connors, Kandice Robinsons, Technician Referring MD:              Medicines:                Monitored Anesthesia Care Complications:            No immediate complications. Estimated Blood Loss:     Estimated blood loss was minimal. Procedure:                Pre-Anesthesia Assessment:                           - Prior to the procedure, a History and Physical                            was performed, and patient medications and                            allergies were reviewed. The patient's tolerance of                            previous anesthesia was also reviewed. The risks                            and benefits of the procedure and the sedation                            options and risks were discussed with the patient.                            All questions were answered, and informed consent  was obtained. Prior Anticoagulants: The patient has                            taken no anticoagulant or antiplatelet agents. ASA                            Grade Assessment: II - A patient with mild systemic                            disease. After reviewing the risks and benefits,                            the patient was deemed in satisfactory condition to                             undergo the procedure.                           After obtaining informed consent, the colonoscope                            was passed under direct vision. Throughout the                            procedure, the patient's blood pressure, pulse, and                            oxygen saturations were monitored continuously. The                            CF-HQ190L ([id number]) Olympus colonoscope was                            introduced through the anus and advanced to the the                            ileocolonic anastomosis. The colonoscopy was                            performed without difficulty. The patient tolerated                            the procedure. The quality of the bowel preparation                            was good. Scope In: 1:11:32 PM Scope Out: 1:29:10 PM Scope Withdrawal Time: 0 hours 13 minutes 18 seconds  Total Procedure Duration: 0 hours 17 minutes 38 seconds  Findings:      Skin tags were found on perianal exam.      The digital rectal exam findings include hemorrhoids. Pertinent       negatives include no palpable rectal lesions.      There was evidence of a prior functional end-to-end ileo-colonic       anastomosis in the ascending colon. This was  patent and was       characterized by healthy appearing mucosa. The anastomosis was traversed.      The neo-terminal ileum appeared normal. Biopsies were taken with a cold       forceps for histology.      Four sessile polyps were found in the rectum. The polyps were 3 to 5 mm       in size. These polyps were removed with a cold snare. Resection and       retrieval were complete.      Multiple small-mouthed diverticula were found in the recto-sigmoid colon.      Normal mucosa was found in the entire colon otherwise. Biopsies were       taken with a cold forceps for histology from the right colon. Biopsies       were taken with a cold forceps for histology from the left colon.       Biopsies  were taken with a cold forceps for histology from the rectum.      Non-bleeding non-thrombosed external and internal hemorrhoids were found       during retroflexion, during perianal exam and during digital exam. The       hemorrhoids were Grade II (internal hemorrhoids that prolapse but reduce       spontaneously). Impression:               - Perianal skin tags found on perianal exam.                            Hemorrhoids found on digital rectal exam.                           - Patent functional end-to-end ileo-colonic                            anastomosis, characterized by healthy appearing                            mucosa.                           - The examined portion of the ileum was normal.                            Biopsied.                           - Four 3 to 5 mm polyps in the rectum, removed with                            a cold snare. Resected and retrieved.                           - Diverticulosis in the recto-sigmoid colon.                           - Normal mucosa in the entire examined colon  otherwise. Biopsied.                           - Non-bleeding non-thrombosed external and internal                            hemorrhoids. Moderate Sedation:      Not Applicable - Patient had care per Anesthesia. Recommendation:           - The patient will be observed post-procedure,                            until all discharge criteria are met.                           - Discharge patient to home.                           - Patient has a contact number available for                            emergencies. The signs and symptoms of potential                            delayed complications were discussed with the                            patient. Return to normal activities tomorrow.                            Written discharge instructions were provided to the                            patient.                           - High fiber  diet.                           - Use FiberCon 1-2 tablets PO daily.                           - Continue present medications.                           - Await pathology results.                           - If pathology is unremarkable, then CT                            enterography repeat will be considered for further                            evaluation of elevated fecal calprotectin and  patient's abdominal pain in the lower abdomen                            bilaterally.                           - Repeat colonoscopy for surveillance based on                            pathology results.                           - The findings and recommendations were discussed                            with the patient.                           - The findings and recommendations were discussed                            with the patient's family. Procedure Code(s):        --- Professional ---                           [phone], Colonoscopy, flexible; with removal of                            tumor(s), polyp(s), or other lesion(s) by snare                            technique                           45380, 59, Colonoscopy, flexible; with biopsy,                            single or multiple Diagnosis Code(s):        --- Professional ---                           Z98.0, Intestinal bypass and anastomosis status                           K64.2, Third degree hemorrhoids                           D12.8, Benign neoplasm of rectum                           K64.4, Residual hemorrhoidal skin tags                           R10.30, Lower abdominal pain, unspecified                           K50.10, Crohn's disease of large intestine without  complications                           K57.30, Diverticulosis of large intestine without                            perforation or abscess without bleeding CPT copyright 2022 American Medical Association. All  rights reserved. The codes documented in this report are preliminary and upon coder review may  be revised to meet current compliance requirements. Corliss Parish, MD 10/16/2023 1:40:29 PM Number of Addenda: 0

## 2023-10-16 NOTE — Anesthesia Postprocedure Evaluation (Signed)
Anesthesia Post Note  Patient: Christabell Pezzuti  Procedure(s) Performed: COLONOSCOPY WITH PROPOFOL BIOPSY POLYPECTOMY     Patient location during evaluation: PACU Anesthesia Type: MAC Level of consciousness: awake and alert Pain management: pain level controlled Vital Signs Assessment: post-procedure vital signs reviewed and stable Respiratory status: spontaneous breathing, nonlabored ventilation, respiratory function stable and patient connected to nasal cannula oxygen Cardiovascular status: stable and blood pressure returned to baseline Postop Assessment: no apparent nausea or vomiting Anesthetic complications: no  No notable events documented.  Last Vitals:  Vitals:   10/16/23 1400 10/16/23 1405  BP: 110/62 115/70  Pulse: 71 66  Resp: 17 19  Temp:    SpO2: 100% 99%    Last Pain:  Vitals:   10/16/23 1405  TempSrc:   PainSc: 0-No pain                 Kennieth Rad

## 2023-10-16 NOTE — Discharge Instructions (Signed)
YOU HAD AN ENDOSCOPIC PROCEDURE TODAY: Refer to the procedure report and other information in the discharge instructions given to you for any specific questions about what was found during the examination. If this information does not answer your questions, please call [place] office at [phone] to clarify.   YOU SHOULD EXPECT: Some feelings of bloating in the abdomen. Passage of more gas than usual. Walking can help get rid of the air that was put into your GI tract during the procedure and reduce the bloating. If you had a lower endoscopy (such as a colonoscopy or flexible sigmoidoscopy) you may notice spotting of blood in your stool or on the toilet paper. Some abdominal soreness may be present for a day or two, also.  DIET: Your first meal following the procedure should be a light meal and then it is ok to progress to your normal diet. A half-sandwich or bowl of soup is an example of a good first meal. Heavy or fried foods are harder to digest and may make you feel nauseous or bloated. Drink plenty of fluids but you should avoid alcoholic beverages for 24 hours.   ACTIVITY: Your care partner should take you home directly after the procedure. You should plan to take it easy, moving slowly for the rest of the day. You can resume normal activity the day after the procedure however YOU SHOULD NOT DRIVE, use power tools, machinery or perform tasks that involve climbing or major physical exertion for 24 hours (because of the sedation medicines used during the test).   SYMPTOMS TO REPORT IMMEDIATELY: A gastroenterologist can be reached at any hour. Please call [phone]  for any of the following symptoms:  Following lower endoscopy (colonoscopy, flexible sigmoidoscopy) Excessive amounts of blood in the stool  Significant tenderness, worsening of abdominal pains  Swelling of the abdomen that is new, acute  Fever of 100 or higher    FOLLOW UP:  If any biopsies were taken you will be contacted by  phone or by letter within the next 1-3 weeks. Call [phone]  if you have not heard about the biopsies in 3 weeks.  Please also call with any specific questions about appointments or follow up tests.

## 2023-10-16 NOTE — Anesthesia Preprocedure Evaluation (Signed)
Anesthesia Evaluation  Patient identified by MRN, date of birth, ID band Patient awake    History of Anesthesia Complications (+) PONV and history of anesthetic complications  Airway Mallampati: II  TM Distance: >3 FB Neck ROM: Full    Dental  (+) Teeth Intact   Pulmonary neg pulmonary ROS, former smoker   Pulmonary exam normal        Cardiovascular hypertension, Pt. on medications  Rhythm:Regular Rate:Normal     Neuro/Psych negative neurological ROS  negative psych ROS   GI/Hepatic Neg liver ROS,,,Crohn's disease   Endo/Other  diabetes, Type 2, Oral Hypoglycemic Agents    Renal/GU   negative genitourinary   Musculoskeletal negative musculoskeletal ROS (+)    Abdominal  (+)  Abdomen: soft. Bowel sounds: normal.  Peds  Hematology  (+) Blood dyscrasia, anemia   Anesthesia Other Findings   Reproductive/Obstetrics                             Anesthesia Physical Anesthesia Plan  ASA: 2  Anesthesia Plan: MAC   Post-op Pain Management:    Induction: Intravenous  PONV Risk Score and Plan: 3 and Propofol infusion and Treatment may vary due to age or medical condition  Airway Management Planned: Simple Face Mask and Natural Airway  Additional Equipment: None  Intra-op Plan:   Post-operative Plan:   Informed Consent: I have reviewed the patients History and Physical, chart, labs and discussed the procedure including the risks, benefits and alternatives for the proposed anesthesia with the patient or authorized representative who has indicated his/her understanding and acceptance.     Dental advisory given  Plan Discussed with: CRNA  Anesthesia Plan Comments:         Anesthesia Quick Evaluation

## 2023-10-16 NOTE — Transfer of Care (Signed)
Immediate Anesthesia Transfer of Care Note  Patient: Angela [name]  Procedure(s) Performed: COLONOSCOPY WITH PROPOFOL BIOPSY POLYPECTOMY  Patient Location: PACU and Endoscopy Unit  Anesthesia Type:General  Level of Consciousness: awake, alert , and oriented  Airway & Oxygen Therapy: Patient Spontanous Breathing and Patient connected to face mask oxygen  Post-op Assessment: Report given to RN and Post -op Vital signs reviewed and stable  Post vital signs: Reviewed and stable  Last Vitals:  Vitals Value Taken Time  BP 91/54 10/16/23 1344  Temp    Pulse 71 10/16/23 1345  Resp 12 10/16/23 1345  SpO2 99 % 10/16/23 1345  Vitals shown include unfiled device data.  Last Pain:  Vitals:   10/16/23 1344  TempSrc:   PainSc: 0-No pain         Complications: No notable events documented.

## 2023-10-16 NOTE — H&P (Signed)
GASTROENTEROLOGY PROCEDURE H&P NOTE   Primary Care Physician: Natalia Leatherwood, DO  HPI: Angela [name] is a 67 y.o. female who presents for Colonoscopy for followup of Crohn's disease, elevated fecal calprotectin, bilateral abdominal pain.  Past Medical History:  Diagnosis Date   C. difficile diarrhea    Crohn's colitis (HCC)    Cyclical vomiting syndrome 03/21/2022   Diabetes mellitus without complication (HCC)    Diplopia 05/05/2022   Family history of breast cancer    Hyperlipidemia    Hypertension    Intra-abdominal phelgmon (RLQ) 03/08/2018   PONV (postoperative nausea and vomiting)    Past Surgical History:  Procedure Laterality Date   APPENDECTOMY     BIOPSY  04/29/2021   Procedure: BIOPSY;  Surgeon: Rachael Fee, MD;  Location: [facility];  Service: Endoscopy;;   CESAREAN SECTION     x2   CHOLECYSTECTOMY  2011   COLONOSCOPY  12/2019   COLONOSCOPY WITH PROPOFOL N/A 04/29/2021   Procedure: COLONOSCOPY WITH PROPOFOL;  Surgeon: Rachael Fee, MD;  Location: [facility];  Service: Endoscopy;  Laterality: N/A;   FLEXIBLE SIGMOIDOSCOPY  03/04/2020   Procedure: DIAGNOSTIC FLEXIBLE SIGMOIDOSCOPY;  Surgeon: Andria Meuse, MD;  Location: [facility];  Service: General;;   KNEE SURGERY Right    LAPAROSCOPIC SUBTOTAL COLECTOMY N/A 03/04/2020   Procedure: LAPAROSCOPIC ILEOCOLECTOMY, LYSIS OF ADHESIONS, BILATERAL TAP BLOCK;  Surgeon: Andria Meuse, MD;  Location: [facility];  Service: General;  Laterality: N/A;   spinal ablation  05/23/2022   SPINE SURGERY     Herniated Disc x 2   tummy tuck  2001   Current Facility-Administered Medications  Medication Dose Route Frequency Provider Last Rate Last Admin   0.9 %  sodium chloride infusion   Intravenous Continuous Mansouraty, Netty Starring., MD        Current Facility-Administered Medications:    0.9 %  sodium chloride infusion, , Intravenous, Continuous, Mansouraty, Netty Starring., MD Allergies  Allergen  Reactions   Humira [Adalimumab] Other (See Comments)    Joints aches/double vision/flu-like symptoms.   Azathioprine Other (See Comments)   Penicillins Hives    Has patient had a PCN reaction causing immediate rash, facial/tongue/throat swelling, SOB or lightheadedness with hypotension: Yes Has patient had a PCN reaction causing severe rash involving mucus membranes or skin necrosis: No Has patient had a PCN reaction that required hospitalization: No Has patient had a PCN reaction occurring within the last 10 years: No If all of the above answers are "NO", then may proceed with Cephalosporin use.    Family History  Problem Relation Age of Onset   Leukemia Mother    Heart failure Mother    Hypertension Mother    Diabetes Mother    Breast cancer Mother    Hearing loss Mother    Kidney disease Mother    Hypertension Father    Cancer Father    Esophageal cancer Father    Depression Father    Alcohol abuse Father    Depression Sister    Cancer Sister    Leukemia Sister 5   Cervical cancer Sister    Breast cancer Maternal Aunt 72   Breast cancer Maternal Grandmother        dx in her late 29s   Cancer Maternal Grandfather    Heart attack Paternal Grandfather    Colon cancer Neg Hx    Stomach cancer Neg Hx    Inflammatory bowel disease Neg Hx    Liver disease  Neg Hx    Pancreatic cancer Neg Hx    Rectal cancer Neg Hx    Social History   Socioeconomic History   Marital status: Married    Spouse name: Not on file   Number of children: 3   Years of education: 3   Highest education level: Master's degree (e.g., MA, MS, MEng, MEd, MSW, MBA)  Occupational History   Occupation: realtor  Tobacco Use   Smoking status: Former    Types: Cigarettes   Smokeless tobacco: Never  Vaping Use   Vaping status: Never Used  Substance and Sexual Activity   Alcohol use: Yes    Comment: occasionally   Drug use: Never   Sexual activity: Yes    Comment: married  Other Topics Concern    Not on file  Social History Narrative   Marital status/children/pets: married, 3 children   Education/employment: Agricultural engineer:      -smoke alarm in the home:Yes     - wears seatbelt: Yes     - Feels safe in their relationships: Yes      Uses hearing aid      Social Determinants of Health   Financial Resource Strain: Low Risk  (09/26/2023)   Overall Financial Resource Strain (CARDIA)    Difficulty of Paying Living Expenses: Not hard at all  Food Insecurity: No Food Insecurity (09/26/2023)   Hunger Vital Sign    Worried About Running Out of Food in the Last Year: Never true    Ran Out of Food in the Last Year: Never true  Transportation Needs: No Transportation Needs (09/26/2023)   PRAPARE - Administrator, Civil Service (Medical): No    Lack of Transportation (Non-Medical): No  Physical Activity: Insufficiently Active (04/02/2023)   Exercise Vital Sign    Days of Exercise per Week: 2 days    Minutes of Exercise per Session: 20 min  Stress: No Stress Concern Present (04/02/2023)   [employer] of Occupational Health - Occupational Stress Questionnaire    Feeling of Stress : Only a little  Social Connections: Moderately Integrated (09/26/2023)   Social Connection and Isolation Panel [NHANES]    Frequency of Communication with Friends and Family: More than three times a week    Frequency of Social Gatherings with Friends and Family: Twice a week    Attends Religious Services: Never    Database administrator or Organizations: Yes    Attends Engineer, structural: More than 4 times per year    Marital Status: Married  Catering manager Violence: Not on file    Physical Exam: Today's Vitals   10/16/23 1055  BP: 128/82  Pulse: 73  Resp: 17  Temp: 97.8 F (36.6 C)  TempSrc: Temporal  SpO2: 100%  Weight: 63.5 kg  Height: 5\' 5"  (1.651 m)  PainSc: 3    Body mass index is 23.3 kg/m. GEN: NAD EYE: Sclerae anicteric ENT: MMM CV:  Non-tachycardic GI: Soft, NT/ND NEURO:  Alert & Oriented x 3  Lab Results: No results for input(s): "WBC", "HGB", "HCT", "PLT" in the last 72 hours. BMET No results for input(s): "NA", "K", "CL", "CO2", "GLUCOSE", "BUN", "CREATININE", "CALCIUM" in the last 72 hours. LFT No results for input(s): "PROT", "ALBUMIN", "AST", "ALT", "ALKPHOS", "BILITOT", "BILIDIR", "IBILI" in the last 72 hours. PT/INR No results for input(s): "LABPROT", "INR" in the last 72 hours.   Impression / Plan: This is a 67 y.o.female  who presents for Colonoscopy for  followup of Crohn's disease, elevated fecal calprotectin, bilateral abdominal pain.  The risks and benefits of endoscopic evaluation/treatment were discussed with the patient and/or family; these include but are not limited to the risk of perforation, infection, bleeding, missed lesions, lack of diagnosis, severe illness requiring hospitalization, as well as anesthesia and sedation related illnesses.  The patient's history has been reviewed, patient examined, no change in status, and deemed stable for procedure.  The patient and/or family is agreeable to proceed.    Corliss Parish, MD [facility] Office # [phone]

## 2023-10-18 ENCOUNTER — Encounter (HOSPITAL_COMMUNITY): Payer: Self-pay | Admitting: Gastroenterology

## 2023-10-23 LAB — SURGICAL PATHOLOGY

## 2023-10-27 ENCOUNTER — Encounter: Payer: Self-pay | Admitting: Gastroenterology

## 2023-10-27 ENCOUNTER — Other Ambulatory Visit: Payer: Self-pay

## 2023-10-27 DIAGNOSIS — K50819 Crohn's disease of both small and large intestine with unspecified complications: Secondary | ICD-10-CM

## 2023-10-27 DIAGNOSIS — R1030 Lower abdominal pain, unspecified: Secondary | ICD-10-CM

## 2023-11-17 ENCOUNTER — Ambulatory Visit (HOSPITAL_COMMUNITY)
Admission: RE | Admit: 2023-11-17 | Discharge: 2023-11-17 | Disposition: A | Discharge status: Home / Self Care | Payer: 59 | Source: Ambulatory Visit | Attending: Gastroenterology | Admitting: Gastroenterology

## 2023-11-17 DIAGNOSIS — K50819 Crohn's disease of both small and large intestine with unspecified complications: Secondary | ICD-10-CM

## 2023-11-17 DIAGNOSIS — R1030 Lower abdominal pain, unspecified: Secondary | ICD-10-CM | POA: Diagnosis present

## 2023-11-17 LAB — POCT I-STAT CREATININE: Creatinine, Ser: 1 mg/dL (ref 0.44–1.00)

## 2023-11-17 MED ORDER — BARIUM SULFATE 0.1 % PO SUSP
1350.0000 mL | Freq: Once | ORAL | Status: AC
  Administered 2023-11-17: 1350 mL via ORAL

## 2023-11-17 MED ORDER — IOHEXOL 300 MG/ML  SOLN
100.0000 mL | Freq: Once | INTRAMUSCULAR | Status: AC | PRN
  Administered 2023-11-17: 100 mL via INTRAVENOUS

## 2023-11-17 MED ORDER — BARIUM SULFATE 0.1 % PO SUSP
ORAL | Status: AC
  Filled 2023-11-17: qty 3

## 2023-11-20 ENCOUNTER — Encounter: Payer: Self-pay | Admitting: Family Medicine

## 2023-11-23 ENCOUNTER — Other Ambulatory Visit (HOSPITAL_COMMUNITY): Payer: Self-pay

## 2023-11-23 MED ORDER — CYCLOBENZAPRINE HCL 5 MG PO TABS: 5.0000 mg | ORAL_TABLET | Freq: Every day | ORAL | 0 refills | Status: DC

## 2023-11-23 NOTE — Telephone Encounter (Signed)
Refilled Flexeril.

## 2023-11-25 ENCOUNTER — Encounter: Payer: Self-pay | Admitting: Gastroenterology

## 2023-11-28 LAB — HM DIABETES EYE EXAM

## 2023-11-29 ENCOUNTER — Other Ambulatory Visit: Payer: Self-pay

## 2023-11-30 NOTE — Telephone Encounter (Addendum)
 See note from pt regarding Stelara- she needs the message as soon as possible she is late getting it.

## 2023-12-11 ENCOUNTER — Other Ambulatory Visit: Payer: 59

## 2023-12-12 ENCOUNTER — Other Ambulatory Visit: Payer: 59

## 2023-12-12 DIAGNOSIS — K50819 Crohn's disease of both small and large intestine with unspecified complications: Secondary | ICD-10-CM

## 2023-12-14 ENCOUNTER — Telehealth: Payer: Self-pay | Admitting: Pharmacy Technician

## 2023-12-14 ENCOUNTER — Other Ambulatory Visit (HOSPITAL_COMMUNITY): Payer: Self-pay

## 2023-12-14 ENCOUNTER — Telehealth: Payer: Self-pay

## 2023-12-14 ENCOUNTER — Telehealth: Payer: Self-pay | Admitting: Pharmacist

## 2023-12-14 LAB — CALPROTECTIN, FECAL: Calprotectin, Fecal: 47 ug/g (ref 0–120)

## 2023-12-14 NOTE — Telephone Encounter (Signed)
Pharmacy Patient Advocate Encounter   Received notification from Physician's Office that prior authorization for [facility] 90MG  is required/requested.   Insurance verification completed.   The patient is insured through [facility] .   Per test claim: PA required; PA submitted to above mentioned insurance via CoverMyMeds Key/confirmation #/EOC [id number] Status is pending

## 2023-12-14 NOTE — Telephone Encounter (Signed)
All Conversations: Field seismologist (Newest Message First)November 27, 2023 Me to Angela [name]      11/27/23 10:08 AM Unfortunately there is up to a 3 week delay on getting results from any imaging due to the number of reading radiologist.  As soon as we get the results I will let you know. Sorry for the delay.      Last read by [name] at  5:07 PM on 12/07/2023. Angela [name] to [facility] (supporting Lemar Lofty., MD)      11/27/23  9:17 AM Last question! Do you have any idea when I should get the results from my cat scan? It was over a week ago but with a holiday in there too.  Angela [name] to [facility] Clinical Pool (supporting Lemar Lofty., MD)      11/27/23  9:13 AM They just resent it. Could you please also send documentation to [employer] that I need to take it every 6 weeks not 8. They deny it every year without the documentation. Thank you so much for your help! Me to Angela [name]      11/27/23  9:02 AM I have not seen anything if you don't mind can you have them resend. I will be in the look out.   Last read by [name] at  5:07 PM on 12/07/2023. [name] to [facility] Clinical Pool (supporting Lemar Lofty., MD)      11/27/23  8:49 AM Per [facility] , they did send a request. If you didn't receive it, please let me know and I'll call them again. Thanks  Me to Angela [name]      11/27/23  8:47 AM I had a great Christmas, I hope you did as well.  Have the pharmacy send [place] a request and I will get that to the prior auth team as soon as we receive it.    Last read by [name] at  5:07 PM on 12/07/2023. November 25, 2023 [name] to [facility] Clinical Pool (supporting Lemar Lofty., MD)      11/25/23  8:26 AM Hi Angela [name], hope you had a wonderful holiday! My stelara needs a prior authorization before it will be refilled again. I believe they will also need documentation that I need to take it  every 6 weeks instead of 8. Insurance company denies it every year without the documentation. Can you please  let me know how long this takes since I'm already late taking medication. Thank you!

## 2023-12-14 NOTE — Telephone Encounter (Signed)
PA has been submitted, and telephone encounter has been created. Please see telephone encounter dated 1.23.25.

## 2023-12-14 NOTE — Telephone Encounter (Signed)
I have sent a message to the pt letting her know the PA has been sent as Expedited

## 2023-12-14 NOTE — Telephone Encounter (Signed)
Noted  

## 2023-12-14 NOTE — Telephone Encounter (Signed)
Pharmacy Patient Advocate Encounter  Received notification from [facility] that Prior Authorization for [facility] 90MG  has been APPROVED from 1.23.25 to 1.23.26 with QUANTITY LIMIT of 1 for a 56 day supply.   PA #/Case ID/Reference #: [id number]

## 2023-12-14 NOTE — Telephone Encounter (Signed)
I have sent a message directly to [employer] PA team to inquire on the status of the Stelara as urgent.   I have also sent a message to the pt with updates

## 2023-12-14 NOTE — Telephone Encounter (Signed)
Angela [name] can you please let me know the status of this patients Stelara. She has been waiting about 2 weeks for her medication.  Thank you

## 2023-12-14 NOTE — Telephone Encounter (Signed)
An expedited appeal has been submitted for Stelara 90MG /ML syringes every 6 weeks dosing. Will advise when response is received.  Appeal letter and supporting documentation were faxed to [phone] on 12/14/2023 @1 :10 pm.  Thank you, Dellie Burns, PharmD Clinical Pharmacist  [facility]  Direct Dial: [phone]

## 2023-12-15 ENCOUNTER — Telehealth: Payer: Self-pay | Admitting: Gastroenterology

## 2023-12-15 NOTE — Telephone Encounter (Signed)
[facility] is needing a new pre-authorization for Stelera medication since it is not going to be covered [phone]

## 2023-12-15 NOTE — Telephone Encounter (Signed)
Monchell is this something prior to what we did yesterday?

## 2023-12-19 ENCOUNTER — Telehealth: Payer: Self-pay | Admitting: Pharmacy Technician

## 2023-12-19 ENCOUNTER — Other Ambulatory Visit (HOSPITAL_COMMUNITY): Payer: Self-pay

## 2023-12-19 NOTE — Telephone Encounter (Signed)
Pt called about following up on appeal. Stated appeals department had the appeal set for standard decision and would not have decision until 2.7.25 or sooner. Pt provided case number she was given ([id number]). Called appeals department to verify expedited fax number. It is correct, but somehow was marked as standard. Rep Karl Luke E.) escalated the appeal and should have a response within 24-72 hours. Called back and informed patient of update. Pt also inquired about patient assistance. Started Stela and Me PAP and faxed to plan [phone])

## 2023-12-19 NOTE — Telephone Encounter (Signed)
Submitted Patient Assistance Application to  [facility]  for [facility]. Will update patient when we receive a response.  PHONE: [phone] FAX: [phone]

## 2023-12-26 ENCOUNTER — Other Ambulatory Visit (HOSPITAL_COMMUNITY): Payer: Self-pay

## 2023-12-29 ENCOUNTER — Other Ambulatory Visit (HOSPITAL_COMMUNITY): Payer: Self-pay

## 2023-12-29 NOTE — Telephone Encounter (Signed)
 Noted.

## 2023-12-29 NOTE — Telephone Encounter (Signed)
 Pharmacy Patient Advocate Encounter  Received notification from [facility] that APPEAL for STELARA  90MG  has been APPROVED from 1.29.25 to 1.29.26   PA #/Case ID/Reference #: [id number]  THEY DO NOT FAX APPROVAL LETTER

## 2024-01-05 LAB — HM MAMMOGRAPHY: HM Mammogram: NORMAL (ref 0–4)

## 2024-01-05 LAB — HM DEXA SCAN: HM Dexa Scan: NORMAL

## 2024-01-19 ENCOUNTER — Telehealth: Payer: Self-pay

## 2024-01-19 NOTE — Telephone Encounter (Signed)
 Results for pt's most recent mammogram faxed 01/18/24. Results given to PCP for review.

## 2024-01-22 NOTE — Telephone Encounter (Signed)
 Please inform patient her mammogram is normal. Bone density is also normal.

## 2024-01-23 NOTE — Telephone Encounter (Signed)
Mychart message read by pt.

## 2024-02-02 ENCOUNTER — Other Ambulatory Visit: Payer: Self-pay | Admitting: Family Medicine

## 2024-02-07 ENCOUNTER — Encounter: Payer: Self-pay | Admitting: Family Medicine

## 2024-02-13 ENCOUNTER — Other Ambulatory Visit: Payer: Self-pay | Admitting: Orthopedic Surgery

## 2024-02-13 DIAGNOSIS — M545 Low back pain, unspecified: Secondary | ICD-10-CM

## 2024-02-18 ENCOUNTER — Ambulatory Visit
Admission: RE | Admit: 2024-02-18 | Discharge: 2024-02-18 | Disposition: A | Discharge status: Home / Self Care | Source: Ambulatory Visit | Attending: Orthopedic Surgery | Admitting: Orthopedic Surgery

## 2024-02-18 DIAGNOSIS — M545 Low back pain, unspecified: Secondary | ICD-10-CM

## 2024-02-27 ENCOUNTER — Other Ambulatory Visit (INDEPENDENT_AMBULATORY_CARE_PROVIDER_SITE_OTHER)

## 2024-02-27 ENCOUNTER — Encounter: Payer: Self-pay | Admitting: Gastroenterology

## 2024-02-27 ENCOUNTER — Ambulatory Visit (INDEPENDENT_AMBULATORY_CARE_PROVIDER_SITE_OTHER): Payer: 59 | Admitting: Gastroenterology

## 2024-02-27 ENCOUNTER — Other Ambulatory Visit: Payer: Self-pay | Admitting: Orthopedic Surgery

## 2024-02-27 VITALS — BP 122/80 | HR 85 | Ht 65.0 in | Wt 134.2 lb

## 2024-02-27 DIAGNOSIS — D84821 Immunodeficiency due to drugs: Secondary | ICD-10-CM

## 2024-02-27 DIAGNOSIS — K50819 Crohn's disease of both small and large intestine with unspecified complications: Secondary | ICD-10-CM

## 2024-02-27 DIAGNOSIS — R1030 Lower abdominal pain, unspecified: Secondary | ICD-10-CM | POA: Diagnosis not present

## 2024-02-27 DIAGNOSIS — Z79899 Other long term (current) drug therapy: Secondary | ICD-10-CM | POA: Diagnosis not present

## 2024-02-27 DIAGNOSIS — M533 Sacrococcygeal disorders, not elsewhere classified: Secondary | ICD-10-CM

## 2024-02-27 LAB — BASIC METABOLIC PANEL WITH GFR
BUN: 15 mg/dL (ref 6–23)
CO2: 28 meq/L (ref 19–32)
Calcium: 9.9 mg/dL (ref 8.4–10.5)
Chloride: 101 meq/L (ref 96–112)
Creatinine, Ser: 0.92 mg/dL (ref 0.40–1.20)
GFR: 65.33 mL/min (ref >60.00)
Glucose, Bld: 93 mg/dL (ref 70–99)
Potassium: 4.3 meq/L (ref 3.5–5.1)
Sodium: 139 meq/L (ref 135–145)

## 2024-02-27 LAB — CBC
HCT: 47.7 % — ABNORMAL HIGH (ref 36.0–46.0)
Hemoglobin: 15.7 g/dL — ABNORMAL HIGH (ref 12.0–15.0)
MCHC: 32.9 g/dL (ref 30.0–36.0)
MCV: 89.3 fl (ref 78.0–100.0)
Platelets: 306 10*3/uL (ref 150.0–400.0)
RBC: 5.34 Mil/uL — ABNORMAL HIGH (ref 3.87–5.11)
RDW: 13.5 % (ref 11.5–15.5)
WBC: 6.4 10*3/uL (ref 4.0–10.5)

## 2024-02-27 LAB — C-REACTIVE PROTEIN: CRP: <1 mg/dL (ref 0.5–20.0)

## 2024-02-27 LAB — SEDIMENTATION RATE: Sed Rate: 2 mm/h (ref 0–30)

## 2024-02-27 MED ORDER — HYOSCYAMINE SULFATE 0.125 MG SL SUBL: 0.1250 mg | SUBLINGUAL_TABLET | Freq: Two times a day (BID) | SUBLINGUAL | 0 refills | Status: DC | PRN

## 2024-02-27 NOTE — Progress Notes (Unsigned)
 GASTROENTEROLOGY OUTPATIENT CLINIC VISIT   Primary Care Provider Natalia Leatherwood, [place] [address] [place] [address] [phone]   Patient Profile: Angela [name] is a 68 y.o. female with a pmh significant for Crohn's disease (status post previous ileocecectomy on Stelara), diabetes, hypertension, hyperlipidemia, prior C. difficile infection.  The patient presents to the [facility] for an evaluation and management of problem(s) noted below:  Problem List No diagnosis found.  Discussed the use of AI scribe software for clinical note transcription with the patient, who gave verbal consent to proceed.  History of Present Illness Please see prior GI notes for details of HPI.  Interval History The patient presents for follow-up.  She has been a previous patient of Dr. Christella Hartigan, and this is the first time I have seen her, but she has been seen by PA Zehr in the last few years.  She has a history of Crohn's disease and is s/p biologic therapy as well as prior ileocecectomy after internal fistulization had occurred years ago.  She presents with intermittent episodes of fatigue and lower abdominal discomfort. These episodes, described as 'flare-ups,' are characterized by significant fatigue that prompts longer periods of rest.  Episodes typically last for a day and have been occurring sporadically.  The patient does however relay that there has been some progressiveness to the discomfort over this last year.  The dullness that is more pronounced on the right side is almost a chronic dull ache. The pain on the left side is described as sharp and severe, often triggered by sneezing, and resolves within a few minutes.  She has not had any updated imaging in over a year.  The patient reports normal bowel movements that are well formed occurring 1-2 times daily.  No nocturnal bowel movements, urgency, blood, or mucus has been noted either.  The patient is currently on Stelara  (Ustekinumab) every 6-weeks with good effect, if she goes longer than 6-weeks she will have more significant discomfort and can have alteration of her bowel habits.  She has been on this regimen for the last few years.  Her last colonoscopy in 2022 was unremarkable for active Crohn's disease.  Her last CT-Enterography is documented below.  The patient also reports being on Mounjaro for at least six months, but she does not associate any worsening of her symptoms with this, and she has only had a 5-10-pound weight loss with this medication.   GI Review of Systems Positive as above Negative for dysphagia, odynophagia, nausea, vomiting, melena, hematochezia  Review of Systems General: Denies fevers/chills/weight loss unintentionally HEENT: Denies oral lesions Cardiovascular: Denies chest pain Pulmonary: Denies shortness of breath Gastroenterological: See HPI Genitourinary: Denies darkened urine  Hematological: Denies easy bruising/bleeding Endocrine: Denies temperature intolerance Dermatological: Denies jaundice or skin rashes Psychological: Mood is stable Musculoskeletal: Denies new arthralgias   Medications Current Outpatient Medications  Medication Sig Dispense Refill   atorvastatin (LIPITOR) 40 MG tablet Take 1 tablet (40 mg total) by mouth daily. 90 tablet 1   diazepam (VALIUM) 5 MG tablet Take 5 mg by mouth 2 (two) times daily as needed.     lisinopril (ZESTRIL) 20 MG tablet Take 1 tablet (20 mg total) by mouth daily. 90 tablet 1   MOUNJARO 12.5 MG/0.5ML Pen INJECT THE CONTENTS OF ONE PEN  SUBCUTANEOUSLY WEEKLY AS  DIRECTED 6 mL 3   STELARA 90 MG/ML SOSY injection INJECT 1 SYRINGE SUBCUTANEOUSLY  EVERY 6 WEEKS 1 mL 6   XIIDRA 5 %  SOLN Apply 1 drop to eye 2 (two) times daily.     No current facility-administered medications for this visit.    Allergies Allergies  Allergen Reactions   Humira [Adalimumab] Other (See Comments)    Joints aches/double vision/flu-like symptoms.    Azathioprine Other (See Comments)   Penicillins Hives    Has patient had a PCN reaction causing immediate rash, facial/tongue/throat swelling, SOB or lightheadedness with hypotension: Yes Has patient had a PCN reaction causing severe rash involving mucus membranes or skin necrosis: No Has patient had a PCN reaction that required hospitalization: No Has patient had a PCN reaction occurring within the last 10 years: No If all of the above answers are "NO", then may proceed with Cephalosporin use.     Histories Past Medical History:  Diagnosis Date   C. difficile diarrhea    Crohn's colitis (HCC)    Cyclical vomiting syndrome 03/21/2022   Diabetes mellitus without complication (HCC)    Diplopia 05/05/2022   Family history of breast cancer    Hyperlipidemia    Hypertension    Intra-abdominal phelgmon (RLQ) 03/08/2018   PONV (postoperative nausea and vomiting)    Past Surgical History:  Procedure Laterality Date   APPENDECTOMY     BIOPSY  04/29/2021   Procedure: BIOPSY;  Surgeon: Rachael Fee, MD;  Location: [facility];  Service: Endoscopy;;   BIOPSY  10/16/2023   Procedure: BIOPSY;  Surgeon: Lemar Lofty., MD;  Location: [facility];  Service: Gastroenterology;;   CESAREAN SECTION     x2   CHOLECYSTECTOMY  2011   COLONOSCOPY  12/2019   COLONOSCOPY WITH PROPOFOL N/A 04/29/2021   Procedure: COLONOSCOPY WITH PROPOFOL;  Surgeon: Rachael Fee, MD;  Location: [facility];  Service: Endoscopy;  Laterality: N/A;   COLONOSCOPY WITH PROPOFOL N/A 10/16/2023   Procedure: COLONOSCOPY WITH PROPOFOL;  Surgeon: Meridee Score Netty Starring., MD;  Location: [facility];  Service: Gastroenterology;  Laterality: N/A;   FLEXIBLE SIGMOIDOSCOPY  03/04/2020   Procedure: DIAGNOSTIC FLEXIBLE SIGMOIDOSCOPY;  Surgeon: Andria Meuse, MD;  Location: [facility];  Service: General;;   KNEE SURGERY Right    LAPAROSCOPIC SUBTOTAL COLECTOMY N/A 03/04/2020   Procedure: LAPAROSCOPIC  ILEOCOLECTOMY, LYSIS OF ADHESIONS, BILATERAL TAP BLOCK;  Surgeon: Andria Meuse, MD;  Location: [facility];  Service: General;  Laterality: N/A;   POLYPECTOMY  10/16/2023   Procedure: POLYPECTOMY;  Surgeon: Lemar Lofty., MD;  Location: [facility];  Service: Gastroenterology;;   spinal ablation  05/23/2022   SPINE SURGERY     Herniated Disc x 2   tummy tuck  2001   Social History   Socioeconomic History   Marital status: Married    Spouse name: Not on file   Number of children: 3   Years of education: 18   Highest education level: Master's degree (e.g., MA, MS, MEng, MEd, MSW, MBA)  Occupational History   Occupation: realtor  Tobacco Use   Smoking status: Former    Types: Cigarettes   Smokeless tobacco: Never  Advertising account planner   Vaping status: Never Used  Substance and Sexual Activity   Alcohol use: Not Currently    Comment: occasionally   Drug use: Never   Sexual activity: Yes    Comment: married  Other Topics Concern   Not on file  Social History Narrative   Marital status/children/pets: married, 3 children   Education/employment: masters degree-retired   Safety:      -smoke alarm in the home:Yes     -  wears seatbelt: Yes     - Feels safe in their relationships: Yes      Uses hearing aid      Social Drivers of Health   Financial Resource Strain: Low Risk  (09/26/2023)   Overall Financial Resource Strain (CARDIA)    Difficulty of Paying Living Expenses: Not hard at all  Food Insecurity: No Food Insecurity (09/26/2023)   Hunger Vital Sign    Worried About Running Out of Food in the Last Year: Never true    Ran Out of Food in the Last Year: Never true  Transportation Needs: No Transportation Needs (09/26/2023)   PRAPARE - Administrator, Civil Service (Medical): No    Lack of Transportation (Non-Medical): No  Physical Activity: Insufficiently Active (04/02/2023)   Exercise Vital Sign    Days of Exercise per Week: 2 days    Minutes of Exercise  per Session: 20 min  Stress: No Stress Concern Present (04/02/2023)   [employer] of Occupational Health - Occupational Stress Questionnaire    Feeling of Stress : Only a little  Social Connections: Moderately Integrated (09/26/2023)   Social Connection and Isolation Panel [NHANES]    Frequency of Communication with Friends and Family: More than three times a week    Frequency of Social Gatherings with Friends and Family: Twice a week    Attends Religious Services: Never    Database administrator or Organizations: Yes    Attends Engineer, structural: More than 4 times per year    Marital Status: Married  Catering manager Violence: Not on file   Family History  Problem Relation Age of Onset   Leukemia Mother    Heart failure Mother    Hypertension Mother    Diabetes Mother    Breast cancer Mother    Hearing loss Mother    Kidney disease Mother    Hypertension Father    Cancer Father    Esophageal cancer Father    Depression Father    Alcohol abuse Father    Depression Sister    Cancer Sister    Leukemia Sister 5   Cervical cancer Sister    Breast cancer Maternal Aunt 78   Breast cancer Maternal Grandmother        dx in her late 66s   Cancer Maternal Grandfather    Heart attack Paternal Grandfather    Colon cancer Neg Hx    Stomach cancer Neg Hx    Inflammatory bowel disease Neg Hx    Liver disease Neg Hx    Pancreatic cancer Neg Hx    Rectal cancer Neg Hx    I have reviewed her medical, social, and family history in detail and updated the electronic medical record as necessary.    PHYSICAL EXAMINATION  BP 122/80   Pulse 85   Ht 5\' 5"  (1.651 m)   Wt 134 lb 4 oz (60.9 kg)   SpO2 99%   BMI 22.34 kg/m  Wt Readings from Last 3 Encounters:  02/27/24 134 lb 4 oz (60.9 kg)  10/16/23 140 lb (63.5 kg)  09/27/23 143 lb (64.9 kg)  GEN: NAD, appears stated age, doesn't appear chronically ill PSYCH: Cooperative, without pressured speech EYE: Conjunctivae  pink, sclerae anicteric ENT: MMM CV: Nontachycardic RESP: CTAB posteriorly, without wheezing GI: NABS, soft, NT/ND, surgical scars present, without rebound or guarding MSK/EXT: No significant lower extremity edema SKIN: No jaundice NEURO:  Alert & Oriented x 3, no focal deficits  REVIEW OF DATA  I reviewed the following data at the time of this encounter:  GI Procedures and Studies  No new procedures to review  Laboratory Studies  Reviewed those in epic  Imaging Studies  February 2023 MRI enterography IMPRESSION: 1. Mild dilatation of the distal ileum leading up the cecum. No mucosal enhancement to suggest active Crohn's disease. 2. No evidence of fistula or abscess in the RIGHT lower quadrant. 3. No evidence of inflammatory bowel disease in the more proximal ileum or jejunum. Stomach and duodenum normal. 4. No colonic fistula identified.   ASSESSMENT  Ms. Beneke is a 68 y.o. female with a pmh significant for Crohn's disease (status post previous ileocecectomy on Stelara), diabetes, hypertension, hyperlipidemia, prior C. difficile infection, family history esophagus cancer (father).  The patient is seen today for evaluation and management of:  No diagnosis found.  The patient is hemodynamically stable.  Clinically, the etiology of her abdominal discomfort over the last year is not clearly defined as her having active Crohn's disease.  However, what has been documented over the last few years, is that every 8 weeks Stelara is not helpful for her and that she begins to have more significant symptoms towards the last 2 weeks of an 8-week regimen.  As such, she has been on a every 6 week Stelara dosing and she needs to continue this moving forward to maximize her chance of not going into a flare.  Additional imaging from a cross-sectional standpoint may be reasonable.  I wonder if this is adhesive disease within her abdomen that is causing her issues as this seems more musculoskeletal  at times.  I am going to order a fecal calprotectin.  If this fecal calprotectin is elevated, then this may tell us that we do need to proceed with additional enterography imaging versus moving forward with a colonoscopy.  If the fecal calprotectin is normal but she continues to have issues then we will move forward with enterography.  Will plan therapeutic drug monitoring before patient's next Stelara injection.   All patient questions were answered to the best of my ability, and the patient agrees to the aforementioned plan of action with follow-up as indicated.   PLAN  Continue Stelara every 6 weeks as she has done for years Obtain fecal calprotectin -If fecal calprotectin is elevated, then we will consider performing updated colonoscopy as well as enterography -If fecal calprotectin is normal, with persisting symptoms, cross-sectional imaging of the abdomen and pelvis will be performed likely as an enterography Colonoscopy otherwise in 2025 QuantiFERON gold to be obtained with next set of labs Therapeutic drug monitoring of Stelara before next injection   No orders of the defined types were placed in this encounter.   New Prescriptions   No medications on file   Modified Medications   No medications on file    Planned Follow Up No follow-ups on file.   Total Time in Face-to-Face and in Coordination of Care for patient including independent/personal interpretation/review of prior testing, medical history, examination, medication adjustment, communicating results with the patient directly, and documentation within the EHR is 30 minutes.  Corliss Parish, MD [facility] Office # [phone]

## 2024-02-27 NOTE — Patient Instructions (Signed)
 We have sent the following medications to your pharmacy for you to pick up at your convenience: Levsin   Follow-up in 6 months. Office will contact you to schedule.   Your provider has requested that you go to the basement level for lab work before leaving today. Press "B" on the elevator. The lab is located at the first door on the left as you exit the elevator.  You will need labs in May prior to taking Stelara. Please come to our lab to have this done. Lab hours 7:30 am -4:30 pm Monday - Friday.   _______________________________________________________  If your blood pressure at your visit was 140/90 or greater, please contact your primary care physician to follow up on this.  _______________________________________________________  If you are age 25 or older, your body mass index should be between 23-30. Your Body mass index is 22.34 kg/m. If this is out of the aforementioned range listed, please consider follow up with your Primary Care Provider.  If you are age 50 or younger, your body mass index should be between 19-25. Your Body mass index is 22.34 kg/m. If this is out of the aformentioned range listed, please consider follow up with your Primary Care Provider.   ________________________________________________________  The [facility] providers would like to encourage you to use [facility] to communicate with providers for non-urgent requests or questions.  Due to long hold times on the telephone, sending your provider a message by [facility] may be a faster and more efficient way to get a response.  Please allow 48 business hours for a response.  Please remember that this is for non-urgent requests.  _______________________________________________________  Thank you for choosing me and [facility].  Dr. Meridee Score

## 2024-02-28 ENCOUNTER — Encounter: Payer: Self-pay | Admitting: Gastroenterology

## 2024-03-15 ENCOUNTER — Ambulatory Visit: Payer: 59 | Admitting: Family Medicine

## 2024-03-15 ENCOUNTER — Encounter: Payer: Self-pay | Admitting: Family Medicine

## 2024-03-15 VITALS — BP 122/78 | HR 77 | Temp 98.0°F | Wt 136.0 lb

## 2024-03-15 DIAGNOSIS — E782 Mixed hyperlipidemia: Secondary | ICD-10-CM | POA: Diagnosis not present

## 2024-03-15 DIAGNOSIS — I10 Essential (primary) hypertension: Secondary | ICD-10-CM

## 2024-03-15 DIAGNOSIS — E1169 Type 2 diabetes mellitus with other specified complication: Secondary | ICD-10-CM | POA: Diagnosis not present

## 2024-03-15 DIAGNOSIS — K50819 Crohn's disease of both small and large intestine with unspecified complications: Secondary | ICD-10-CM | POA: Diagnosis not present

## 2024-03-15 DIAGNOSIS — Z23 Encounter for immunization: Secondary | ICD-10-CM

## 2024-03-15 DIAGNOSIS — Z7985 Long-term (current) use of injectable non-insulin antidiabetic drugs: Secondary | ICD-10-CM | POA: Diagnosis not present

## 2024-03-15 DIAGNOSIS — I700 Atherosclerosis of aorta: Secondary | ICD-10-CM

## 2024-03-15 LAB — MICROALBUMIN / CREATININE URINE RATIO
Creatinine,U: 32.7 mg/dL
Microalb Creat Ratio: UNDETERMINED mg/g (ref 0.0–30.0)
Microalb, Ur: <0.7 mg/dL

## 2024-03-15 LAB — POCT GLYCOSYLATED HEMOGLOBIN (HGB A1C)
HbA1c POC (<> result, manual entry): 5.1 % (ref 4.0–5.6)
HbA1c, POC (controlled diabetic range): 5.1 % (ref 0.0–7.0)
HbA1c, POC (prediabetic range): 5.1 % — AB (ref 5.7–6.4)
Hemoglobin A1C: 5.1 % (ref 4.0–5.6)

## 2024-03-15 MED ORDER — LISINOPRIL 20 MG PO TABS: 20.0000 mg | ORAL_TABLET | Freq: Every day | ORAL | 1 refills | Status: DC

## 2024-03-15 MED ORDER — ATORVASTATIN CALCIUM 40 MG PO TABS: 40.0000 mg | ORAL_TABLET | Freq: Every day | ORAL | 1 refills | Status: DC

## 2024-03-15 NOTE — Progress Notes (Signed)
 Patient ID: Angela [name], female  DOB: Sep 03, 1956, 68 y.o.   MRN: [id number] Patient Care Team    Relationship Specialty Notifications Start End  Mariel Shope, DO PCP - General Family Medicine  12/02/22   [facility] ophthamology Consulting Physician Ophthalmology  05/06/21   Corena Devon, MD Referring Physician Neurosurgery  12/02/22   Phebe Brasil, MD Consulting Physician Neurology  12/02/22   Mansouraty, Albino Alu., MD Consulting Physician Gastroenterology  09/27/23     Chief Complaint  Patient presents with   Diabetes    Chronic Conditions/illness Management Pt is fasting.     Subjective:  Angela [name] is a 68 y.o.  Female  present for  chronic condition management combination appointment. All past medical history, surgical history, allergies, family history, immunizations, medications and social history were updated in the electronic medical record today. All recent labs, ED visits and hospitalizations within the last year were reviewed.  Type 2 diabetes mellitus with stage 3a chronic kidney disease, without long-term current use of insulin  (HCC) Patient reports compliance with Mounjaro  12.5 mg weekly.  Patient denies dizziness, hyperglycemic or hypoglycemic events. Patient denies numbness, tingling in the extremities or nonhealing wounds of feet.   Primary hypertension/Mixed hyperlipidemia/Hardening of the aorta (main artery of the heart) (HCC) Pt reports compliance with lisinopril  20 mg daily.  Patient denies chest pain, shortness of breath, dizziness or lower extremity edema.   RF: Hypertension, hyperlipidemia, diabetes  Crohn's disease of small and large intestines with complication (HCC) Managed by Dr. Howard Macho. Prescribed Stelara .  Headaches: Taking b12 supplement . She is seeing improvement in her headaches.      09/27/2023    7:54 AM 04/03/2023   10:23 AM 12/02/2022   11:02 AM 08/24/2020    2:56 PM 02/06/2018    9:07 AM  Depression screen PHQ 2/9  Decreased  Interest 0 0 0 0 0  Down, Depressed, Hopeless 0 0 0 0 0  PHQ - 2 Score 0 0 0 0 0       No data to display              Immunization History  Administered Date(s) Administered   PNEUMOCOCCAL CONJUGATE-20 09/27/2023   Pneumococcal Conjugate-13 05/14/2018   Zoster Recombinant(Shingrix ) 09/27/2023, 03/15/2024    Past Medical History:  Diagnosis Date   C. difficile diarrhea    Crohn's colitis (HCC)    Cyclical vomiting syndrome 03/21/2022   Diabetes mellitus without complication (HCC)    Diplopia 05/05/2022   Family history of breast cancer    Hyperlipidemia    Hypertension    Increased frequency of headaches 05/05/2022   Intra-abdominal phelgmon (RLQ) 03/08/2018   Lower abdominal pain 08/31/2023   PONV (postoperative nausea and vomiting)    Allergies  Allergen Reactions   Humira  [Adalimumab ] Other (See Comments)    Joints aches/double vision/flu-like symptoms.   Azathioprine  Other (See Comments)   Penicillins Hives    Has patient had a PCN reaction causing immediate rash, facial/tongue/throat swelling, SOB or lightheadedness with hypotension: Yes Has patient had a PCN reaction causing severe rash involving mucus membranes or skin necrosis: No Has patient had a PCN reaction that required hospitalization: No Has patient had a PCN reaction occurring within the last 10 years: No If all of the above answers are "NO", then may proceed with Cephalosporin use.    Past Surgical History:  Procedure Laterality Date   APPENDECTOMY     BIOPSY  04/29/2021   Procedure: BIOPSY;  Surgeon: Janel Medford, MD;  Location: [facility];  Service: Endoscopy;;   BIOPSY  10/16/2023   Procedure: BIOPSY;  Surgeon: Normie Becton., MD;  Location: [facility];  Service: Gastroenterology;;   CESAREAN SECTION     x2   CHOLECYSTECTOMY  2011   COLONOSCOPY  12/2019   COLONOSCOPY WITH PROPOFOL  N/A 04/29/2021   Procedure: COLONOSCOPY WITH PROPOFOL ;  Surgeon: Janel Medford, MD;   Location: [facility];  Service: Endoscopy;  Laterality: N/A;   COLONOSCOPY WITH PROPOFOL  N/A 10/16/2023   Procedure: COLONOSCOPY WITH PROPOFOL ;  Surgeon: Brice Campi Albino Alu., MD;  Location: [facility];  Service: Gastroenterology;  Laterality: N/A;   FLEXIBLE SIGMOIDOSCOPY  03/04/2020   Procedure: DIAGNOSTIC FLEXIBLE SIGMOIDOSCOPY;  Surgeon: Melvenia Stabs, MD;  Location: [facility];  Service: General;;   KNEE SURGERY Right    LAPAROSCOPIC SUBTOTAL COLECTOMY N/A 03/04/2020   Procedure: LAPAROSCOPIC ILEOCOLECTOMY, LYSIS OF ADHESIONS, BILATERAL TAP BLOCK;  Surgeon: Melvenia Stabs, MD;  Location: [facility];  Service: General;  Laterality: N/A;   POLYPECTOMY  10/16/2023   Procedure: POLYPECTOMY;  Surgeon: Normie Becton., MD;  Location: [facility];  Service: Gastroenterology;;   spinal ablation  05/23/2022   SPINE SURGERY     Herniated Disc x 2   tummy tuck  2001   Family History  Problem Relation Age of Onset   Leukemia Mother    Heart failure Mother    Hypertension Mother    Diabetes Mother    Breast cancer Mother    Hearing loss Mother    Kidney disease Mother    Hypertension Father    Cancer Father    Esophageal cancer Father    Depression Father    Alcohol abuse Father    Depression Sister    Cancer Sister    Leukemia Sister 5   Cervical cancer Sister    Breast cancer Maternal Aunt 6   Breast cancer Maternal Grandmother        dx in her late 59s   Cancer Maternal Grandfather    Heart attack Paternal Grandfather    Colon cancer Neg Hx    Stomach cancer Neg Hx    Inflammatory bowel disease Neg Hx    Liver disease Neg Hx    Pancreatic cancer Neg Hx    Rectal cancer Neg Hx    Social History   Social History Narrative   Marital status/children/pets: married, 3 children   Education/employment: masters degree-retired   Safety:      -smoke alarm in the home:Yes     - wears seatbelt: Yes     - Feels safe in their relationships: Yes      Uses  hearing aid       Allergies as of 03/15/2024       Reactions   Humira  [adalimumab ] Other (See Comments)   Joints aches/double vision/flu-like symptoms.   Azathioprine  Other (See Comments)   Penicillins Hives   Has patient had a PCN reaction causing immediate rash, facial/tongue/throat swelling, SOB or lightheadedness with hypotension: Yes Has patient had a PCN reaction causing severe rash involving mucus membranes or skin necrosis: No Has patient had a PCN reaction that required hospitalization: No Has patient had a PCN reaction occurring within the last 10 years: No If all of the above answers are "NO", then may proceed with Cephalosporin use.        Medication List        Accurate as of March 15, 2024  8:26 AM.  If you have any questions, ask your nurse or doctor.          STOP taking these medications    diazepam  5 MG tablet Commonly known as: VALIUM  Stopped by: Napolean Backbone   hyoscyamine  0.125 MG SL tablet Commonly known as: Levsin/SL Stopped by: Napolean Backbone       TAKE these medications    atorvastatin  40 MG tablet Commonly known as: LIPITOR Take 1 tablet (40 mg total) by mouth daily.   lisinopril  20 MG tablet Commonly known as: ZESTRIL  Take 1 tablet (20 mg total) by mouth daily.   Mounjaro  12.5 MG/0.5ML Pen Generic drug: tirzepatide  INJECT THE CONTENTS OF ONE PEN  SUBCUTANEOUSLY WEEKLY AS  DIRECTED   Stelara  90 MG/ML Sosy injection Generic drug: ustekinumab  INJECT 1 SYRINGE SUBCUTANEOUSLY  EVERY 6 WEEKS   Xiidra 5 % Soln Generic drug: Lifitegrast Apply 1 drop to eye 2 (two) times daily.       All past medical history, surgical history, allergies, family history, immunizations andmedications were updated in the EMR today and reviewed under the history and medication portions of their EMR.     MR BRAIN W WO CONTRAST Result Date: 06/09/2022 FINDINGS: The brain parenchyma shows mild age-related changes of chronic small vessel disease and minimal  supratentorial cortical atrophy.  No other structural lesion, tumor or infarct is noted.  Diffusion-weighted imaging is negative for acute ischemia.  SWI sequences do not show any microhemorrhages.  Subarachnoid space and ventricular system appear normal.  Cortical sulci and gyri show normal appearance.  Extra-axial brain structures appear normal.  Calvarium shows no abnormalities.  Orbits appear unremarkable.  Paranasal sinuses show mild chronic inflammatory changes.  The pituitary gland and cerebellar tonsils within normal.  Visualized portion of the upper cervical spine shows no abnormalities.  Flow-voids of large vessels of intracranial circulation appear to be patent.  Postcontrast images do not result in abnormal areas of enhancement.   ROS 14 pt review of systems performed and negative (unless mentioned in an HPI)  Objective: BP 122/78   Pulse 77   Temp 98 F (36.7 C)   Wt 136 lb (61.7 kg)   SpO2 95%   BMI 22.63 kg/m  Physical Exam Vitals and nursing note reviewed.  Constitutional:      General: She is not in acute distress.    Appearance: Normal appearance. She is not ill-appearing, toxic-appearing or diaphoretic.  HENT:     Head: Normocephalic and atraumatic.  Eyes:     General: No scleral icterus.       Right eye: No discharge.        Left eye: No discharge.     Extraocular Movements: Extraocular movements intact.     Conjunctiva/sclera: Conjunctivae normal.     Pupils: Pupils are equal, round, and reactive to light.  Cardiovascular:     Rate and Rhythm: Normal rate and regular rhythm.  Pulmonary:     Effort: Pulmonary effort is normal. No respiratory distress.     Breath sounds: Normal breath sounds. No wheezing, rhonchi or rales.  Musculoskeletal:     Cervical back: Neck supple.     Right lower leg: No edema.     Left lower leg: No edema.  Skin:    General: Skin is warm.     Findings: No rash.  Neurological:     Mental Status: She is alert and oriented to person,  place, and time. Mental status is at baseline.     Motor: No weakness.  Gait: Gait normal.  Psychiatric:        Mood and Affect: Mood normal.        Behavior: Behavior normal.        Thought Content: Thought content normal.        Judgment: Judgment normal.     No results found.  Assessment/plan: Jennalee Greaves is a 68 y.o. female present for chronic condition management  DM type 2 with diabetic mixed hyperlipidemia (HCC) Stable Continue mounjaro  12.5 mg weekly PNA series: Completed after 67 Flu shot: declined(recommneded yearly) Microalb: UTD 03/2022. Collected today Foot exam: completed 09/27/2023 Eye exam: 11/2023, Dr. Daine Drummer oph.  A1c: 5.1> 5.2>5.6>5.1 A1c collected today GFR: 02/2024  Primary hypertension/hyperlipidemia/atherosclerosis of the aorta Stable Continue lisinopril  20 mg daily Continue atorvastatin  40 mg daily Labs due next visit  Crohn's disease of small and large intestines with complication (HCC)/Immunosuppression due to drug therapy (HCC) Managed by GI. Continue Stelara  Vitamin D  levels UTD  Return in about 7 months (around 09/30/2024) for cpe (20 min), Routine chronic condition follow-up.   Orders Placed This Encounter  Procedures   Varicella-zoster vaccine IM   Urine Microalbumin w/creat. ratio   POCT HgB A1C   Meds ordered this encounter  Medications   atorvastatin  (LIPITOR) 40 MG tablet    Sig: Take 1 tablet (40 mg total) by mouth daily.    Dispense:  90 tablet    Refill:  1   lisinopril  (ZESTRIL ) 20 MG tablet    Sig: Take 1 tablet (20 mg total) by mouth daily.    Dispense:  90 tablet    Refill:  1   Referral Orders  No referral(s) requested today     Electronically signed by: Napolean Backbone, DO  [facility]- [place]

## 2024-03-15 NOTE — Patient Instructions (Addendum)

## 2024-03-18 ENCOUNTER — Encounter: Payer: Self-pay | Admitting: Family Medicine

## 2024-03-20 ENCOUNTER — Encounter: Payer: Self-pay | Admitting: Family Medicine

## 2024-03-20 ENCOUNTER — Other Ambulatory Visit: Payer: Self-pay | Admitting: Gastroenterology

## 2024-03-22 ENCOUNTER — Other Ambulatory Visit

## 2024-03-29 ENCOUNTER — Ambulatory Visit
Admission: RE | Admit: 2024-03-29 | Discharge: 2024-03-29 | Disposition: A | Discharge status: Home / Self Care | Source: Ambulatory Visit | Attending: Orthopedic Surgery | Admitting: Orthopedic Surgery

## 2024-03-29 DIAGNOSIS — M533 Sacrococcygeal disorders, not elsewhere classified: Secondary | ICD-10-CM

## 2024-04-09 ENCOUNTER — Encounter: Payer: Self-pay | Admitting: Gastroenterology

## 2024-04-11 ENCOUNTER — Encounter: Payer: Self-pay | Admitting: Urgent Care

## 2024-04-11 ENCOUNTER — Ambulatory Visit (INDEPENDENT_AMBULATORY_CARE_PROVIDER_SITE_OTHER): Admitting: Urgent Care

## 2024-04-11 ENCOUNTER — Other Ambulatory Visit: Payer: Self-pay

## 2024-04-11 ENCOUNTER — Ambulatory Visit: Payer: Self-pay

## 2024-04-11 VITALS — BP 134/86 | HR 68 | Temp 97.3°F | Wt 130.8 lb

## 2024-04-11 DIAGNOSIS — R42 Dizziness and giddiness: Secondary | ICD-10-CM

## 2024-04-11 DIAGNOSIS — K50819 Crohn's disease of both small and large intestine with unspecified complications: Secondary | ICD-10-CM

## 2024-04-11 DIAGNOSIS — R8290 Unspecified abnormal findings in urine: Secondary | ICD-10-CM

## 2024-04-11 DIAGNOSIS — R197 Diarrhea, unspecified: Secondary | ICD-10-CM | POA: Diagnosis not present

## 2024-04-11 LAB — POC URINALSYSI DIPSTICK (AUTOMATED)
Bilirubin, UA: NEGATIVE
Blood, UA: NEGATIVE
Glucose, UA: NEGATIVE
Ketones, UA: NEGATIVE
Nitrite, UA: NEGATIVE
Protein, UA: NEGATIVE
Spec Grav, UA: 1.015 (ref 1.010–1.025)
Urobilinogen, UA: 0.2 U/dL
pH, UA: 6 (ref 5.0–8.0)

## 2024-04-11 LAB — CBC WITH DIFFERENTIAL/PLATELET
Basophils Absolute: 0.1 10*3/uL (ref 0.0–0.1)
Basophils Relative: 1.5 % (ref 0.0–3.0)
Eosinophils Absolute: 0.2 10*3/uL (ref 0.0–0.7)
Eosinophils Relative: 3.3 % (ref 0.0–5.0)
HCT: 45.5 % (ref 36.0–46.0)
Hemoglobin: 15.3 g/dL — ABNORMAL HIGH (ref 12.0–15.0)
Lymphocytes Relative: 25.1 % (ref 12.0–46.0)
Lymphs Abs: 1.6 10*3/uL (ref 0.7–4.0)
MCHC: 33.7 g/dL (ref 30.0–36.0)
MCV: 88.3 fl (ref 78.0–100.0)
Monocytes Absolute: 0.4 10*3/uL (ref 0.1–1.0)
Monocytes Relative: 6.1 % (ref 3.0–12.0)
Neutro Abs: 4.2 10*3/uL (ref 1.4–7.7)
Neutrophils Relative %: 64 % (ref 43.0–77.0)
Platelets: 311 10*3/uL (ref 150.0–400.0)
RBC: 5.15 Mil/uL — ABNORMAL HIGH (ref 3.87–5.11)
RDW: 13.9 % (ref 11.5–15.5)
WBC: 6.5 10*3/uL (ref 4.0–10.5)

## 2024-04-11 LAB — MAGNESIUM: Magnesium: 2.1 mg/dL (ref 1.5–2.5)

## 2024-04-11 MED ORDER — NITROFURANTOIN MONOHYD MACRO 100 MG PO CAPS: 100.0000 mg | ORAL_CAPSULE | Freq: Two times a day (BID) | ORAL | 0 refills | Status: DC

## 2024-04-11 NOTE — Telephone Encounter (Signed)
 Copied from CRM [id number]. Topic: Clinical - Red Word Triage >> Apr 11, 2024  7:33 AM Kita Perish H wrote: [facility] that prompted transfer to Nurse Triage: Dizziness last night couldn't stand up had to lay down, urine smells really bad\  Chief Complaint: dizziness and urine smells Symptoms: see above Frequency: for two weeks Pertinent Negatives: Patient denies fever, blood in urine, numbness, tingling Disposition: [] ED /[] Urgent Care (no appt availability in office) / [x] Appointment(In office/virtual)/ []  [facility]/ [] Home Care/ [] Refused Recommended Disposition /[] [facility]/ []  Follow-up with PCP Additional Notes: per protocol apt made for this am; care advice given, denies questions; instructed to go to ER if becomes worse.   Reason for Disposition  [1] MODERATE dizziness (e.g., interferes with normal activities) AND [2] has NOT been evaluated by doctor (or NP/PA) for this  (Exception: Dizziness caused by heat exposure, sudden standing, or poor fluid intake.)  Answer Assessment - Initial Assessment Questions 1. DESCRIPTION: "Describe your dizziness."     Just looses balance and walks into walls 2. LIGHTHEADED: "Do you feel lightheaded?" (e.g., somewhat faint, woozy, weak upon standing)     Lightheaded,  3. VERTIGO: "Do you feel like either you or the room is spinning or tilting?" (i.e. vertigo)     See above 4. SEVERITY: "How bad is it?"  "Do you feel like you are going to faint?" "Can you stand and walk?"   - MILD: Feels slightly dizzy, but walking normally.   - MODERATE: Feels unsteady when walking, but not falling; interferes with normal activities (e.g., school, work).   - SEVERE: Unable to walk without falling, or requires assistance to walk without falling; feels like passing out now.      severe 5. ONSET:  "When did the dizziness begin?"     Two weeks 6. AGGRAVATING FACTORS: "Does anything make it worse?" (e.g., standing, change in head position)      Standing and change in head position 7. HEART RATE: "Can you tell me your heart rate?" "How many beats in 15 seconds?"  (Note: not all patients can do this)       no 8. CAUSE: "What do you think is causing the dizziness?"     no 9. RECURRENT SYMPTOM: "Have you had dizziness before?" If Yes, ask: "When was the last time?" "What happened that time?"     no 10. OTHER SYMPTOMS: "Do you have any other symptoms?" (e.g., fever, chest pain, vomiting, diarrhea, bleeding)       Urine smells, diarrhea  11. PREGNANCY: "Is there any chance you are pregnant?" "When was your last menstrual period?"       na  Protocols used: Dizziness - Lightheadedness-A-AH

## 2024-04-11 NOTE — Telephone Encounter (Signed)
 Would have her do fecal calprotectin, C. difficile, stool culture for stool studies.  Go ahead and have her do ESR and CRP with blood work when she gets her Stelara  antibody levels. Thanks. GM

## 2024-04-11 NOTE — Telephone Encounter (Signed)
 No further action needed.

## 2024-04-11 NOTE — Progress Notes (Signed)
 Established Patient Office Visit  Subjective:  Patient ID: Angela [name], female    DOB: 11-28-1955  Age: 68 y.o. MRN: [id number]  Chief Complaint  Patient presents with   Dizziness    Dizziness that started about 2 weeks ago but has gotten worse last night. She has also been having diarrhea and foul smelling urine.    Dizziness    Discussed the use of AI scribe software for clinical note transcription with the patient, who gave verbal consent to proceed.  History of Present Illness   Angela [name] is a 68 year old female with Crohn's disease who presents with lightheadedness, diarrhea, and foul-smelling urine.  She has been experiencing lightheadedness for the past two weeks, which has progressively worsened. The sensation is described as losing balance and needing to hold onto something, rather than a spinning sensation. It is not positional but occurs with rapid head movements and while walking. Severe symptoms last night included nausea and dizziness, leading her to cancel a planned outing. She has a history of vertigo 15 years ago, but states this current episode feels different.  Diarrhea began two weeks ago, characterized by liquid stools occurring up to seven times a day. She used two tabs of Imodium AD yesterday to manage symptoms, which has temporarily halted her bowel movements with none noted today. Typically, she experiences three liquid bowel movements daily. There is no associated pain, bloating, or gas. Bright red blood has been noticed in her stool for the past couple of months; follows with GI and states "he didn't seem worried about it". Her Crohn's disease is usually controlled.  For the past two weeks, she has noticed a foul odor in her urine, though there is no pain or change in appearance. She has not tried any treatments for this symptom. She has a history of C. diff infection and is cautious about antibiotic use due to this.  In terms of social history, she  drinks water and Diet Coke regularly. She planned a social outing recently but had to cancel due to her symptoms.       Patient Active Problem List   Diagnosis Date Noted   History of colonic polyps 10/16/2023   Advanced bone age 46/04/2023   Immunosuppression due to drug therapy (HCC) 08/31/2023   DM type 2 with diabetic mixed hyperlipidemia (HCC) 12/02/2022   Cerebrovascular small vessel disease 12/02/2022   Does use hearing aid 12/02/2022   Hardening of the aorta (main artery of the heart) (HCC) 03/21/2022   Postlaminectomy syndrome, not elsewhere classified 03/21/2022   Crohn's disease of small and large intestines with complication (HCC) 05/02/2018   Hypertension 03/08/2018   Genetic testing 11/23/2017   Family history of breast cancer    Displacement of lumbar intervertebral disc without myelopathy 12/11/2014   Past Medical History:  Diagnosis Date   C. difficile diarrhea    Crohn's colitis (HCC)    Cyclical vomiting syndrome 03/21/2022   Diabetes mellitus without complication (HCC)    Diplopia 05/05/2022   Family history of breast cancer    Hyperlipidemia    Hypertension    Increased frequency of headaches 05/05/2022   Intra-abdominal phelgmon (RLQ) 03/08/2018   Lower abdominal pain 08/31/2023   PONV (postoperative nausea and vomiting)    Past Surgical History:  Procedure Laterality Date   APPENDECTOMY     BIOPSY  04/29/2021   Procedure: BIOPSY;  Surgeon: Janel Medford, MD;  Location: [facility];  Service: Endoscopy;;   BIOPSY  10/16/2023   Procedure: BIOPSY;  Surgeon: Normie Becton., MD;  Location: [facility];  Service: Gastroenterology;;   CESAREAN SECTION     x2   CHOLECYSTECTOMY  2011   COLONOSCOPY  12/2019   COLONOSCOPY WITH PROPOFOL  N/A 04/29/2021   Procedure: COLONOSCOPY WITH PROPOFOL ;  Surgeon: Janel Medford, MD;  Location: [facility];  Service: Endoscopy;  Laterality: N/A;   COLONOSCOPY WITH PROPOFOL  N/A 10/16/2023   Procedure:  COLONOSCOPY WITH PROPOFOL ;  Surgeon: Brice Campi Albino Alu., MD;  Location: [facility];  Service: Gastroenterology;  Laterality: N/A;   FLEXIBLE SIGMOIDOSCOPY  03/04/2020   Procedure: DIAGNOSTIC FLEXIBLE SIGMOIDOSCOPY;  Surgeon: Melvenia Stabs, MD;  Location: [facility];  Service: General;;   KNEE SURGERY Right    LAPAROSCOPIC SUBTOTAL COLECTOMY N/A 03/04/2020   Procedure: LAPAROSCOPIC ILEOCOLECTOMY, LYSIS OF ADHESIONS, BILATERAL TAP BLOCK;  Surgeon: Melvenia Stabs, MD;  Location: [facility];  Service: General;  Laterality: N/A;   POLYPECTOMY  10/16/2023   Procedure: POLYPECTOMY;  Surgeon: Normie Becton., MD;  Location: [facility];  Service: Gastroenterology;;   spinal ablation  05/23/2022   SPINE SURGERY     Herniated Disc x 2   tummy tuck  2001   Social History   Tobacco Use   Smoking status: Former    Types: Cigarettes   Smokeless tobacco: Never  Vaping Use   Vaping status: Never Used  Substance Use Topics   Alcohol use: Not Currently    Comment: occasionally   Drug use: Never      ROS: as noted in HPI  Objective:      BP 134/86   Pulse 68   Temp (!) 97.3 F (36.3 C) (Oral)   Wt 130 lb 12.8 oz (59.3 kg)   SpO2 100%   BMI 21.77 kg/m  BP Readings from Last 3 Encounters:  04/11/24 134/86  03/15/24 122/78  02/27/24 122/80   Wt Readings from Last 3 Encounters:  04/11/24 130 lb 12.8 oz (59.3 kg)  03/15/24 136 lb (61.7 kg)  02/27/24 134 lb 4 oz (60.9 kg)      Physical Exam Vitals and nursing note reviewed.  Constitutional:      General: She is not in acute distress.    Appearance: Normal appearance. She is not ill-appearing, toxic-appearing or diaphoretic.  HENT:     Head: Normocephalic and atraumatic.     Right Ear: Tympanic membrane, ear canal and external ear normal. There is no impacted cerumen.     Left Ear: Tympanic membrane, ear canal and external ear normal. There is no impacted cerumen.     Nose: Nose normal.     Mouth/Throat:      Mouth: Mucous membranes are moist.     Pharynx: Oropharynx is clear. No oropharyngeal exudate or posterior oropharyngeal erythema.  Eyes:     General: No visual field deficit or scleral icterus.       Right eye: No discharge.        Left eye: No discharge.     Extraocular Movements: Extraocular movements intact.     Pupils: Pupils are equal, round, and reactive to light.  Neck:     Thyroid : No thyroid  mass, thyromegaly or thyroid  tenderness.  Cardiovascular:     Rate and Rhythm: Normal rate and regular rhythm.     Pulses: Normal pulses.     Heart sounds: No murmur heard. Pulmonary:     Effort: Pulmonary effort is normal. No respiratory distress.     Breath sounds: Normal  breath sounds. No stridor. No wheezing or rhonchi.  Abdominal:     General: Abdomen is flat. Bowel sounds are normal. There is no distension.     Palpations: Abdomen is soft. There is no mass.     Tenderness: There is no guarding.     Comments: Chronic mild tenderness to abdomen without sx of rigidity, guarding or acute abdomen  Musculoskeletal:     Cervical back: Normal range of motion and neck supple. No rigidity or tenderness.     Right lower leg: No edema.     Left lower leg: No edema.  Lymphadenopathy:     Cervical: No cervical adenopathy.  Skin:    General: Skin is warm and dry.     Coloration: Skin is not jaundiced.     Findings: No bruising, erythema or rash.  Neurological:     General: No focal deficit present.     Mental Status: She is alert and oriented to person, place, and time. Mental status is at baseline.     Cranial Nerves: Cranial nerves 2-12 are intact. No cranial nerve deficit, dysarthria or facial asymmetry.     Sensory: Sensation is intact. No sensory deficit.     Motor: Motor function is intact. No weakness or atrophy.     Coordination: Coordination is intact.  Psychiatric:        Mood and Affect: Mood normal.        Behavior: Behavior normal.      Results for orders placed or  performed in visit on 04/11/24  POCT Urinalysis Dipstick (Automated)  Result Value Ref Range   Color, UA Yellow    Clarity, UA Cloudy    Glucose, UA Negative Negative   Bilirubin, UA Negative    Ketones, UA Negative    Spec Grav, UA 1.015 1.010 - 1.025   Blood, UA Negative    pH, UA 6.0 5.0 - 8.0   Protein, UA Negative Negative   Urobilinogen, UA 0.2 0.2 or 1.0 E.U./dL   Nitrite, UA Negative    Leukocytes, UA Trace (A) Negative    Last CBC Lab Results  Component Value Date   WBC 6.4 02/27/2024   HGB 15.7 (H) 02/27/2024   HCT 47.7 (H) 02/27/2024   MCV 89.3 02/27/2024   MCH 30.2 03/06/2020   RDW 13.5 02/27/2024   PLT 306.0 02/27/2024   Last metabolic panel Lab Results  Component Value Date   GLUCOSE 93 02/27/2024   NA 139 02/27/2024   K 4.3 02/27/2024   CL 101 02/27/2024   CO2 28 02/27/2024   BUN 15 02/27/2024   CREATININE 0.92 02/27/2024   GFR 65.33 02/27/2024   CALCIUM  9.9 02/27/2024   PHOS 4.2 03/08/2018   PROT 6.6 09/18/2023   ALBUMIN  4.3 09/18/2023   BILITOT 0.7 09/18/2023   ALKPHOS 61 09/18/2023   AST 17 09/18/2023   ALT 22 09/18/2023   ANIONGAP 7 03/06/2020      The 10-year ASCVD risk score (Arnett DK, et al., 2019) is: 15%  Assessment & Plan:  Foul smelling urine -     POCT Urinalysis Dipstick (Automated) -     Urine Culture -     Nitrofurantoin Monohyd Macro; Take 1 capsule (100 mg total) by mouth 2 (two) times daily.  Dispense: 14 capsule; Refill: 0  Lightheaded -     COMPLETE METABOLIC PANEL WITHOUT GFR -     CBC with Differential/Platelet -     Magnesium  Diarrhea, unspecified type -  COMPLETE METABOLIC PANEL WITHOUT GFR -     CBC with Differential/Platelet -     Magnesium -     Clostridium Difficile by PCR -     GI Profile, Stool, PCR  Assessment and Plan     Diarrhea Diarrhea for two weeks, up to seven liquid stools daily. Differential includes Crohn's exacerbation and potential C. difficile infection. - Provide stool culture  kit for C. difficile testing. - Submit stool sample when bowel movements resume; discontinue OTC anti-diarrheals.  Dizziness and lightheadedness Intermittent dizziness and lightheadedness for two weeks, worsening over time. Differential includes electrolyte imbalance due to diarrhea.  - Check electrolytes to rule out imbalance. - Advise increased intake of electrolyte-rich fluids until lab results are available.  Urinary tract infection (suspected) Suspected UTI based on foul-smelling, cloudy urine with trace leukocytes. Differential includes infection versus non-infectious causes related to electrolyte imbalance. Limited antibiotic options due to C. difficile history. - Send urine sample for culture. - Start nitrofurantoin pending culture results, given C. difficile history. - Discontinue antibiotic if culture is negative.         No follow-ups on file.   Mandy Second, PA

## 2024-04-11 NOTE — Telephone Encounter (Signed)
 Please disregard message. This has been forwarded to whitney.

## 2024-04-11 NOTE — Patient Instructions (Signed)
 Please start taking the nitrofurantoin. Take this twice daily until gone. We will call if the results of the urine test are negative.  Please start drinking Pedialyte or Gatorade to replenish electrolyte loss through stool.  We will order these labs to see if there are abnormalities.  Given your history of C. Diff, I would like to test for that as well. Please complete the stool kit at your earliest convenience and bring back to lab for processing.

## 2024-04-12 ENCOUNTER — Ambulatory Visit: Payer: Self-pay | Admitting: Urgent Care

## 2024-04-12 ENCOUNTER — Ambulatory Visit: Payer: Self-pay | Admitting: Gastroenterology

## 2024-04-12 ENCOUNTER — Other Ambulatory Visit (INDEPENDENT_AMBULATORY_CARE_PROVIDER_SITE_OTHER)

## 2024-04-12 DIAGNOSIS — R197 Diarrhea, unspecified: Secondary | ICD-10-CM | POA: Diagnosis not present

## 2024-04-12 DIAGNOSIS — K50819 Crohn's disease of both small and large intestine with unspecified complications: Secondary | ICD-10-CM | POA: Diagnosis not present

## 2024-04-12 LAB — COMPLETE METABOLIC PANEL WITHOUT GFR
AG Ratio: 2.4 (calc) (ref 1.0–2.5)
ALT: 20 U/L (ref 6–29)
AST: 17 U/L (ref 10–35)
Albumin: 4.7 g/dL (ref 3.6–5.1)
Alkaline phosphatase (APISO): 65 U/L (ref 37–153)
BUN: 16 mg/dL (ref 7–25)
CO2: 27 mmol/L (ref 20–32)
Calcium: 10.1 mg/dL (ref 8.6–10.4)
Chloride: 104 mmol/L (ref 98–110)
Creat: 0.91 mg/dL (ref 0.50–1.05)
Globulin: 2 g/dL (ref 1.9–3.7)
Glucose, Bld: 93 mg/dL (ref 65–99)
Potassium: 4.7 mmol/L (ref 3.5–5.3)
Sodium: 139 mmol/L (ref 135–146)
Total Bilirubin: 0.8 mg/dL (ref 0.2–1.2)
Total Protein: 6.7 g/dL (ref 6.1–8.1)

## 2024-04-12 LAB — HIGH SENSITIVITY CRP: CRP, High Sensitivity: 0.35 mg/L (ref 0.000–5.000)

## 2024-04-12 LAB — SEDIMENTATION RATE: Sed Rate: 1 mm/h (ref 0–30)

## 2024-04-12 NOTE — Addendum Note (Signed)
 Addended by: Zelene Barga on: 04/12/2024 09:32 AM   Modules accepted: Orders

## 2024-04-14 LAB — GI PROFILE, STOOL, PCR

## 2024-04-14 LAB — URINE CULTURE
MICRO NUMBER:: 16490363
SPECIMEN QUALITY:: ADEQUATE

## 2024-04-14 LAB — CLOSTRIDIUM DIFFICILE BY PCR: Toxigenic C. Difficile by PCR: NEGATIVE

## 2024-04-21 LAB — SERIAL MONITORING

## 2024-04-22 LAB — USTEKINUMAB AND ANTI-USTEK AB
Anti-Ustekinumab Antibody: <40 ng/mL
Ustekinumab: 7.9 ug/mL

## 2024-04-24 ENCOUNTER — Ambulatory Visit: Payer: Self-pay | Admitting: Gastroenterology

## 2024-05-30 ENCOUNTER — Ambulatory Visit: Payer: Self-pay

## 2024-05-30 NOTE — Telephone Encounter (Signed)
Pt scheduled for appt.

## 2024-05-30 NOTE — Telephone Encounter (Signed)
 FYI Only or Action Required?: FYI only for provider.  Patient was last seen in primary care on 04/11/2024 by Lowella Benton CROME, PA.  Called Nurse Triage reporting No chief complaint on file..  Symptoms began several weeks ago.  Interventions attempted: Nothing.  Symptoms are: unchanged.  Triage Disposition: See Physician Within 24 Hours  Patient/caregiver understands and will follow disposition?: Yes      Copied from CRM [id number]. Topic: Clinical - Red Word Triage >> May 30, 2024  3:46 PM Suzen RAMAN wrote: Red Word that prompted transfer to Nurse Triage: UTI and blurry vision Reason for Disposition  Bad or foul-smelling urine  Answer Assessment - Initial Assessment Questions 1. SYMPTOM: What's the main symptom you're concerned about? (e.g., frequency, incontinence)     odor 2. ONSET: When did the  odor  start?     3 weeks 3. PAIN: Is there any pain? If Yes, ask: How bad is it? (Scale: 1-10; mild, moderate, severe)     no 4. CAUSE: What do you think is causing the symptoms?     UTI 5. OTHER SYMPTOMS: Do you have any other symptoms? (e.g., blood in urine, fever, flank pain, pain with urination)     Blurred vision that is off and on for a long time  Protocols used: Urinary Symptoms-A-AH

## 2024-05-31 ENCOUNTER — Ambulatory Visit (INDEPENDENT_AMBULATORY_CARE_PROVIDER_SITE_OTHER): Admitting: Family Medicine

## 2024-05-31 ENCOUNTER — Encounter: Payer: Self-pay | Admitting: Family Medicine

## 2024-05-31 VITALS — BP 106/73 | HR 69 | Temp 96.8°F | Ht 65.0 in | Wt 132.8 lb

## 2024-05-31 DIAGNOSIS — R8290 Unspecified abnormal findings in urine: Secondary | ICD-10-CM

## 2024-05-31 LAB — POCT URINALYSIS DIPSTICK
Bilirubin, UA: NEGATIVE
Blood, UA: NEGATIVE
Glucose, UA: NEGATIVE
Ketones, UA: NEGATIVE
Leukocytes, UA: NEGATIVE
Nitrite, UA: NEGATIVE
Protein, UA: NEGATIVE
Spec Grav, UA: 1.015 (ref 1.010–1.025)
Urobilinogen, UA: 0.2 U/dL
pH, UA: 6 (ref 5.0–8.0)

## 2024-05-31 NOTE — Progress Notes (Signed)
 OFFICE VISIT  05/31/2024  CC:  Chief Complaint  Patient presents with   Urinary Concern    Pt c/o urine odor for the last few days; denies flank or back pain, urinary frequency or urgency. Has not taken any OTC meds    Patient is a 68 y.o. female who presents for abnormal urine odor.  HPI: She had concern of abnormal urine odor a couple of months ago and came in for evaluation. Her urine culture showed she had E. coli UTI, pansensitive.  Treated with Macrobid  for 7 days.  She felt like the odor went away but not long after finishing the antibiotic it returned. Interestingly, she has no dysuria or urinary urgency or frequency or suprapubic discomfort.  No vaginal discharge.  No back, flank, or abdominal pain.  No blood in urine. Her urine is light yellow. She does feel like she does not hydrate well.  She has Crohn's disease, history of C. difficile. Past Medical History:  Diagnosis Date   C. difficile diarrhea    Crohn's colitis (HCC)    Cyclical vomiting syndrome 03/21/2022   Diabetes mellitus without complication (HCC)    Diplopia 05/05/2022   Family history of breast cancer    Hyperlipidemia    Hypertension    Increased frequency of headaches 05/05/2022   Intra-abdominal phelgmon (RLQ) 03/08/2018   Lower abdominal pain 08/31/2023   PONV (postoperative nausea and vomiting)     Past Surgical History:  Procedure Laterality Date   APPENDECTOMY     BIOPSY  04/29/2021   Procedure: BIOPSY;  Surgeon: Teressa Toribio SQUIBB, MD;  Location: [facility];  Service: Endoscopy;;   BIOPSY  10/16/2023   Procedure: BIOPSY;  Surgeon: Wilhelmenia Aloha Raddle., MD;  Location: [facility];  Service: Gastroenterology;;   CESAREAN SECTION     x2   CHOLECYSTECTOMY  2011   COLONOSCOPY  12/2019   COLONOSCOPY WITH PROPOFOL  N/A 04/29/2021   Procedure: COLONOSCOPY WITH PROPOFOL ;  Surgeon: Teressa Toribio SQUIBB, MD;  Location: [facility];  Service: Endoscopy;  Laterality: N/A;   COLONOSCOPY WITH  PROPOFOL  N/A 10/16/2023   Procedure: COLONOSCOPY WITH PROPOFOL ;  Surgeon: Wilhelmenia Aloha Raddle., MD;  Location: [facility];  Service: Gastroenterology;  Laterality: N/A;   FLEXIBLE SIGMOIDOSCOPY  03/04/2020   Procedure: DIAGNOSTIC FLEXIBLE SIGMOIDOSCOPY;  Surgeon: Teresa Lonni HERO, MD;  Location: [facility];  Service: General;;   KNEE SURGERY Right    LAPAROSCOPIC SUBTOTAL COLECTOMY N/A 03/04/2020   Procedure: LAPAROSCOPIC ILEOCOLECTOMY, LYSIS OF ADHESIONS, BILATERAL TAP BLOCK;  Surgeon: Teresa Lonni HERO, MD;  Location: [facility];  Service: General;  Laterality: N/A;   POLYPECTOMY  10/16/2023   Procedure: POLYPECTOMY;  Surgeon: Wilhelmenia Aloha Raddle., MD;  Location: [facility];  Service: Gastroenterology;;   spinal ablation  05/23/2022   SPINE SURGERY     Herniated Disc x 2   tummy tuck  2001    Outpatient Medications Prior to Visit  Medication Sig Dispense Refill   atorvastatin  (LIPITOR) 40 MG tablet Take 1 tablet (40 mg total) by mouth daily. 90 tablet 1   lisinopril  (ZESTRIL ) 20 MG tablet Take 1 tablet (20 mg total) by mouth daily. 90 tablet 1   MOUNJARO  12.5 MG/0.5ML Pen INJECT THE CONTENTS OF ONE PEN  SUBCUTANEOUSLY WEEKLY AS  DIRECTED 6 mL 3   STELARA  90 MG/ML SOSY injection INJECT 1 SYRINGE SUBCUTANEOUSLY  EVERY 6 WEEKS 1 mL 6   XIIDRA 5 % SOLN Apply 1 drop to eye 2 (two) times daily.  nitrofurantoin , macrocrystal-monohydrate, (MACROBID ) 100 MG capsule Take 1 capsule (100 mg total) by mouth 2 (two) times daily. 14 capsule 0   No facility-administered medications prior to visit.    Allergies  Allergen Reactions   Humira  [Adalimumab ] Other (See Comments)    Joints aches/double vision/flu-like symptoms.   Azathioprine  Other (See Comments)   Penicillins Hives    Has patient had a PCN reaction causing immediate rash, facial/tongue/throat swelling, SOB or lightheadedness with hypotension: Yes Has patient had a PCN reaction causing severe rash involving mucus membranes or  skin necrosis: No Has patient had a PCN reaction that required hospitalization: No Has patient had a PCN reaction occurring within the last 10 years: No If all of the above answers are NO, then may proceed with Cephalosporin use.     Review of Systems  As per HPI  PE:    05/31/2024    8:53 AM 04/11/2024    9:17 AM 03/15/2024    8:09 AM  Vitals with BMI  Height 5' 5    Weight 132 lbs 13 oz 130 lbs 13 oz 136 lbs  BMI 22.1  22.63  Systolic 106 134 877  Diastolic 73 86 78  Pulse 69 68 77     Physical Exam  Gen: Alert, well appearing.  Patient is oriented to person, place, time, and situation. AFFECT: pleasant, lucid thought and speech. No further exam today  LABS:  Last CBC Lab Results  Component Value Date   WBC 6.5 04/11/2024   HGB 15.3 (H) 04/11/2024   HCT 45.5 04/11/2024   MCV 88.3 04/11/2024   MCH 30.2 03/06/2020   RDW 13.9 04/11/2024   PLT 311.0 04/11/2024   Last metabolic panel Lab Results  Component Value Date   GLUCOSE 93 04/11/2024   NA 139 04/11/2024   K 4.7 04/11/2024   CL 104 04/11/2024   CO2 27 04/11/2024   BUN 16 04/11/2024   CREATININE 0.91 04/11/2024   GFR 65.33 02/27/2024   CALCIUM  10.1 04/11/2024   PHOS 4.2 03/08/2018   PROT 6.7 04/11/2024   ALBUMIN  4.3 09/18/2023   BILITOT 0.8 04/11/2024   ALKPHOS 61 09/18/2023   AST 17 04/11/2024   ALT 20 04/11/2024   ANIONGAP 7 03/06/2020   Last hemoglobin A1c Lab Results  Component Value Date   HGBA1C 5.1 03/15/2024   HGBA1C 5.1 03/15/2024   HGBA1C 5.1 (A) 03/15/2024   HGBA1C 5.1 03/15/2024   IMPRESSION AND PLAN:  Abnormal urine odor but no other urinary tract infection symptoms. UA today is normal. We will send the specimen for culture but hold off on any antibiotics at this time (history of C. difficile).  An After Visit Summary was printed and given to the patient.  FOLLOW UP: Return if symptoms worsen or fail to improve.  Signed:  Gerlene Hockey, MD           05/31/2024

## 2024-06-02 ENCOUNTER — Ambulatory Visit: Payer: Self-pay | Admitting: Family Medicine

## 2024-06-02 LAB — URINE CULTURE
MICRO NUMBER:: 16689207
SPECIMEN QUALITY:: ADEQUATE

## 2024-06-02 MED ORDER — NITROFURANTOIN MONOHYD MACRO 100 MG PO CAPS: 100.0000 mg | ORAL_CAPSULE | Freq: Two times a day (BID) | ORAL | 0 refills | Status: AC

## 2024-06-03 NOTE — Telephone Encounter (Signed)
 Hi Brandee, Yes, I remember.  I realize we're sort of stuck between a rock and a hard place. I have chosen the antibiotic that is the least likely to kill off your gut bacteria (but still kill the urine bacteria). The only other option we have is to not treat the urinary infection (simply wait to see if any worse symptoms start occurring, such as painful urination, unusual urinary urgency and frequency, suprapubic pain, or blood in urine).   Ultimately, if you feel uncomfortable taking the antibiotic I understand.   I do not think this is coming from intestinal inflammation. --PM

## 2024-06-18 ENCOUNTER — Encounter: Payer: Self-pay | Admitting: Gastroenterology

## 2024-06-18 ENCOUNTER — Other Ambulatory Visit: Payer: Self-pay | Admitting: Gastroenterology

## 2024-07-02 ENCOUNTER — Encounter: Payer: Self-pay | Admitting: Gastroenterology

## 2024-08-01 ENCOUNTER — Encounter: Payer: Self-pay | Admitting: Gastroenterology

## 2024-08-02 ENCOUNTER — Encounter: Payer: Self-pay | Admitting: Family Medicine

## 2024-08-02 NOTE — Telephone Encounter (Signed)
 I am not sure. I recommend urology evaluation.  Millicent, please order referral to urogynecology.  Diagnosis recurrent UTI, abnormal urine odor.

## 2024-08-05 ENCOUNTER — Telehealth: Payer: Self-pay | Admitting: Gastroenterology

## 2024-08-05 ENCOUNTER — Other Ambulatory Visit (HOSPITAL_COMMUNITY): Payer: Self-pay

## 2024-08-05 ENCOUNTER — Telehealth: Payer: Self-pay

## 2024-08-05 ENCOUNTER — Other Ambulatory Visit: Payer: Self-pay

## 2024-08-05 DIAGNOSIS — N390 Urinary tract infection, site not specified: Secondary | ICD-10-CM

## 2024-08-05 DIAGNOSIS — R8290 Unspecified abnormal findings in urine: Secondary | ICD-10-CM

## 2024-08-05 NOTE — Telephone Encounter (Signed)
 Inbound call from patient stating she is in need of medication stelara  since she will be going on a trip soon to [place] . Patient states pharmacy is requesting prior authorization.  Patient also requesting a call back  Please advise  Thank you

## 2024-08-05 NOTE — Telephone Encounter (Signed)
 Message sent to the pt to confirm she is ok with the switch and confirm when is next dose due.

## 2024-08-05 NOTE — Telephone Encounter (Signed)
 PA request has been Submitted. New Encounter has been or will be created for follow up. For additional info see Pharmacy Prior Auth telephone encounter from 08-05-2024.

## 2024-08-05 NOTE — Telephone Encounter (Signed)
 Angela [name], I am sorry to hear this. I am okay with either of the biosimilars which would be covered by her insurance. We need to update the patient so that she is aware about the transition. When is her next dose due? Thanks. GM

## 2024-08-05 NOTE — Telephone Encounter (Signed)
 Pharmacy Patient Advocate Encounter  Received notification from [employer] that Prior Authorization for Stelara  90MG /ML syringes has been DENIED.  Full denial letter will be uploaded to the media tab. See denial reason below.  This medication is not covered under the pharmacy benefit for this plan.    PA #/Case ID/Reference #: [id number]

## 2024-08-05 NOTE — Telephone Encounter (Signed)
 Pharmacy Patient Advocate Encounter   Received notification from Pt Calls Messages that prior authorization for Stelara  90MG /ML syringes is required/requested.   Insurance verification completed.   The patient is insured through [facility] .   Per test claim: PA required; PA submitted to above mentioned insurance via Latent Key/confirmation #/EOC [id number] Status is pending

## 2024-08-22 ENCOUNTER — Encounter: Payer: Self-pay | Admitting: Gastroenterology

## 2024-08-22 DIAGNOSIS — K50819 Crohn's disease of both small and large intestine with unspecified complications: Secondary | ICD-10-CM

## 2024-08-23 NOTE — Telephone Encounter (Signed)
 Please let patient know I am sorry to hear this. I do think we will need to switch to Wezlana for now. We should give her a chance for at least 6 months of this medication therapy. If still having issues, we will then try to get patient to have prior authorization to go back to Stelara . Have her come in for a fecal calprotectin when able, prior to initiation of Wezlana if possible. Thanks. GM

## 2024-08-29 MED ORDER — WEZLANA 90 MG/ML ~~LOC~~ SOSY: 90.0000 mg | PREFILLED_SYRINGE | SUBCUTANEOUS | 11 refills | Status: DC

## 2024-08-29 NOTE — Addendum Note (Signed)
 Addended by: ANITRA ODETTA CROME on: 08/29/2024 12:26 PM   Modules accepted: Orders

## 2024-09-08 ENCOUNTER — Other Ambulatory Visit: Payer: Self-pay | Admitting: Family Medicine

## 2024-09-12 ENCOUNTER — Other Ambulatory Visit

## 2024-09-12 ENCOUNTER — Other Ambulatory Visit (INDEPENDENT_AMBULATORY_CARE_PROVIDER_SITE_OTHER)

## 2024-09-12 DIAGNOSIS — K50819 Crohn's disease of both small and large intestine with unspecified complications: Secondary | ICD-10-CM

## 2024-09-15 ENCOUNTER — Other Ambulatory Visit: Payer: Self-pay | Admitting: Family Medicine

## 2024-09-15 LAB — CALPROTECTIN, FECAL: Calprotectin, Fecal: 340 ug/g — ABNORMAL HIGH (ref 0–120)

## 2024-09-17 ENCOUNTER — Ambulatory Visit: Payer: Self-pay | Admitting: Gastroenterology

## 2024-09-17 ENCOUNTER — Encounter: Payer: Self-pay | Admitting: Gastroenterology

## 2024-09-17 ENCOUNTER — Ambulatory Visit (INDEPENDENT_AMBULATORY_CARE_PROVIDER_SITE_OTHER): Admitting: Gastroenterology

## 2024-09-17 ENCOUNTER — Other Ambulatory Visit (INDEPENDENT_AMBULATORY_CARE_PROVIDER_SITE_OTHER)

## 2024-09-17 VITALS — BP 114/80 | HR 94 | Ht 65.0 in | Wt 135.4 lb

## 2024-09-17 DIAGNOSIS — R195 Other fecal abnormalities: Secondary | ICD-10-CM

## 2024-09-17 DIAGNOSIS — K50819 Crohn's disease of both small and large intestine with unspecified complications: Secondary | ICD-10-CM | POA: Diagnosis not present

## 2024-09-17 DIAGNOSIS — G8929 Other chronic pain: Secondary | ICD-10-CM

## 2024-09-17 DIAGNOSIS — K529 Noninfective gastroenteritis and colitis, unspecified: Secondary | ICD-10-CM | POA: Diagnosis not present

## 2024-09-17 DIAGNOSIS — K625 Hemorrhage of anus and rectum: Secondary | ICD-10-CM

## 2024-09-17 DIAGNOSIS — R194 Change in bowel habit: Secondary | ICD-10-CM

## 2024-09-17 DIAGNOSIS — R1031 Right lower quadrant pain: Secondary | ICD-10-CM

## 2024-09-17 LAB — CBC
HCT: 42.3 % (ref 36.0–46.0)
Hemoglobin: 14.4 g/dL (ref 12.0–15.0)
MCHC: 34 g/dL (ref 30.0–36.0)
MCV: 88.8 fl (ref 78.0–100.0)
Platelets: 300 K/uL (ref 150.0–400.0)
RBC: 4.76 Mil/uL (ref 3.87–5.11)
RDW: 12.9 % (ref 11.5–15.5)
WBC: 5.3 K/uL (ref 4.0–10.5)

## 2024-09-17 LAB — COMPREHENSIVE METABOLIC PANEL WITH GFR
ALT: 21 U/L (ref 0–35)
AST: 18 U/L (ref 0–37)
Albumin: 4.6 g/dL (ref 3.5–5.2)
Alkaline Phosphatase: 61 U/L (ref 39–117)
BUN: 17 mg/dL (ref 6–23)
CO2: 28 meq/L (ref 19–32)
Calcium: 9.5 mg/dL (ref 8.4–10.5)
Chloride: 104 meq/L (ref 96–112)
Creatinine, Ser: 0.86 mg/dL (ref 0.40–1.20)
GFR: 70.56 mL/min (ref >60.00)
Glucose, Bld: 92 mg/dL (ref 70–99)
Potassium: 4.2 meq/L (ref 3.5–5.1)
Sodium: 140 meq/L (ref 135–145)
Total Bilirubin: 0.6 mg/dL (ref 0.2–1.2)
Total Protein: 7 g/dL (ref 6.0–8.3)

## 2024-09-17 LAB — SEDIMENTATION RATE: Sed Rate: 3 mm/h (ref 0–30)

## 2024-09-17 LAB — C-REACTIVE PROTEIN: CRP: <0.5 mg/dL (ref 0.5–20.0)

## 2024-09-17 MED ORDER — NA SULFATE-K SULFATE-MG SULF 17.5-3.13-1.6 GM/177ML PO SOLN: 1.0000 | ORAL | 0 refills | Status: DC

## 2024-09-17 NOTE — Progress Notes (Unsigned)
 GASTROENTEROLOGY OUTPATIENT CLINIC VISIT   Primary Care Provider Catherine Charlies [facility], [place] [address] [place] [address] [phone]   Patient Profile: Angela [name] is a 68 y.o. female with a pmh significant for Crohn's disease (status post previous ileocecectomy on Stelara ), diabetes, hypertension, hyperlipidemia, prior C. difficile infection.  The patient presents to the [facility] for an evaluation and management of problem(s) noted below:  Problem List No diagnosis found.  Discussed the use of AI scribe software for clinical note transcription with the patient, who gave verbal consent to proceed.  History of Present Illness Please see prior GI notes for details of HPI.  Interval History     GI Review of Systems Positive as above Negative for dysphagia, odynophagia, nausea, vomiting, melena  Review of Systems General: Denies fevers/chills/weight loss unintentionally HEENT: Denies oral lesions Cardiovascular: Denies chest pain Pulmonary: Denies shortness of breath Gastroenterological: See HPI Genitourinary: Denies darkened urine  Hematological: Denies easy bruising/bleeding Endocrine: Denies temperature intolerance Dermatological: Denies jaundice Psychological: Mood is stable Musculoskeletal: Denies new arthralgias   Medications Current Outpatient Medications  Medication Sig Dispense Refill   atorvastatin  (LIPITOR) 40 MG tablet TAKE 1 TABLET BY MOUTH DAILY 30 tablet 0   lisinopril  (ZESTRIL ) 20 MG tablet TAKE 1 TABLET BY MOUTH DAILY 30 tablet 0   MOUNJARO  12.5 MG/0.5ML Pen INJECT THE CONTENTS OF ONE PEN  SUBCUTANEOUSLY WEEKLY AS  DIRECTED 6 mL 3   XIIDRA 5 % SOLN Apply 1 drop to eye 2 (two) times daily.     Ustekinumab -auub (WEZLANA) 90 MG/ML SOSY Inject 90 mg into the skin every 6 (six) weeks. (Patient not taking: Reported on 09/17/2024) 1 mL 11   No current facility-administered medications for this visit.    Allergies Allergies   Allergen Reactions   Humira  [Adalimumab ] Other (See Comments)    Joints aches/double vision/flu-like symptoms.   Azathioprine  Other (See Comments)   Penicillins Hives    Has patient had a PCN reaction causing immediate rash, facial/tongue/throat swelling, SOB or lightheadedness with hypotension: Yes Has patient had a PCN reaction causing severe rash involving mucus membranes or skin necrosis: No Has patient had a PCN reaction that required hospitalization: No Has patient had a PCN reaction occurring within the last 10 years: No If all of the above answers are NO, then may proceed with Cephalosporin use.     Histories Past Medical History:  Diagnosis Date   C. difficile diarrhea    Crohn's colitis (HCC)    Cyclical vomiting syndrome 03/21/2022   Diabetes mellitus without complication (HCC)    Diplopia 05/05/2022   Family history of breast cancer    Hyperlipidemia    Hypertension    Increased frequency of headaches 05/05/2022   Intra-abdominal phelgmon (RLQ) 03/08/2018   Lower abdominal pain 08/31/2023   PONV (postoperative nausea and vomiting)    Past Surgical History:  Procedure Laterality Date   APPENDECTOMY     BIOPSY  04/29/2021   Procedure: BIOPSY;  Surgeon: Teressa Toribio SQUIBB, MD;  Location: [facility];  Service: Endoscopy;;   BIOPSY  10/16/2023   Procedure: BIOPSY;  Surgeon: Wilhelmenia Aloha Raddle., MD;  Location: [facility];  Service: Gastroenterology;;   CESAREAN SECTION     x2   CHOLECYSTECTOMY  2011   COLONOSCOPY  12/2019   COLONOSCOPY WITH PROPOFOL  N/A 04/29/2021   Procedure: COLONOSCOPY WITH PROPOFOL ;  Surgeon: Teressa Toribio SQUIBB, MD;  Location: [facility];  Service: Endoscopy;  Laterality: N/A;   COLONOSCOPY WITH PROPOFOL   N/A 10/16/2023   Procedure: COLONOSCOPY WITH PROPOFOL ;  Surgeon: Wilhelmenia Aloha Raddle., MD;  Location: [facility];  Service: Gastroenterology;  Laterality: N/A;   FLEXIBLE SIGMOIDOSCOPY  03/04/2020   Procedure: DIAGNOSTIC FLEXIBLE  SIGMOIDOSCOPY;  Surgeon: Teresa Lonni HERO, MD;  Location: [facility];  Service: General;;   KNEE SURGERY Right    LAPAROSCOPIC SUBTOTAL COLECTOMY N/A 03/04/2020   Procedure: LAPAROSCOPIC ILEOCOLECTOMY, LYSIS OF ADHESIONS, BILATERAL TAP BLOCK;  Surgeon: Teresa Lonni HERO, MD;  Location: [facility];  Service: General;  Laterality: N/A;   POLYPECTOMY  10/16/2023   Procedure: POLYPECTOMY;  Surgeon: Wilhelmenia Aloha Raddle., MD;  Location: [facility];  Service: Gastroenterology;;   spinal ablation  05/23/2022   SPINE SURGERY     Herniated Disc x 2   tummy tuck  2001   Social History   Socioeconomic History   Marital status: Married    Spouse name: Not on file   Number of children: 3   Years of education: 18   Highest education level: Master's degree (e.g., MA, MS, MEng, MEd, MSW, MBA)  Occupational History   Occupation: realtor  Tobacco Use   Smoking status: Former    Types: Cigarettes   Smokeless tobacco: Never  Advertising Account Planner   Vaping status: Never Used  Substance and Sexual Activity   Alcohol use: Not Currently    Comment: occasionally   Drug use: Never   Sexual activity: Yes    Comment: married  Other Topics Concern   Not on file  Social History Narrative   Marital status/children/pets: married, 3 children   Education/employment: masters degree-retired   Safety:      -smoke alarm in the home:Yes     - wears seatbelt: Yes     - Feels safe in their relationships: Yes      Uses hearing aid      Social Drivers of Health   Financial Resource Strain: Low Risk  (05/30/2024)   Overall Financial Resource Strain (CARDIA)    Difficulty of Paying Living Expenses: Not hard at all  Food Insecurity: No Food Insecurity (05/30/2024)   Hunger Vital Sign    Worried About Running Out of Food in the Last Year: Never true    Ran Out of Food in the Last Year: Never true  Transportation Needs: No Transportation Needs (05/30/2024)   PRAPARE - Administrator, Civil Service  (Medical): No    Lack of Transportation (Non-Medical): No  Physical Activity: Sufficiently Active (05/30/2024)   Exercise Vital Sign    Days of Exercise per Week: 2 days    Minutes of Exercise per Session: 90 min  Stress: No Stress Concern Present (05/30/2024)   [employer] of Occupational Health - Occupational Stress Questionnaire    Feeling of Stress: Not at all  Social Connections: Moderately Integrated (05/30/2024)   Social Connection and Isolation Panel    Frequency of Communication with Friends and Family: Three times a week    Frequency of Social Gatherings with Friends and Family: Twice a week    Attends Religious Services: Never    Database Administrator or Organizations: Yes    Attends Engineer, Structural: More than 4 times per year    Marital Status: Married  Catering Manager Violence: Not on file   Family History  Problem Relation Age of Onset   Leukemia Mother    Heart failure Mother    Hypertension Mother    Diabetes Mother    Breast cancer Mother  Hearing loss Mother    Kidney disease Mother    Hypertension Father    Cancer Father    Esophageal cancer Father    Depression Father    Alcohol abuse Father    Depression Sister    Cancer Sister    Leukemia Sister 5   Cervical cancer Sister    Breast cancer Maternal Aunt 52   Breast cancer Maternal Grandmother        dx in her late 50s   Cancer Maternal Grandfather    Heart attack Paternal Grandfather    Colon cancer Neg Hx    Stomach cancer Neg Hx    Inflammatory bowel disease Neg Hx    Liver disease Neg Hx    Pancreatic cancer Neg Hx    Rectal cancer Neg Hx    I have reviewed her medical, social, and family history in detail and updated the electronic medical record as necessary.    PHYSICAL EXAMINATION  BP 114/80   Pulse 94   Ht 5' 5 (1.651 m)   Wt 135 lb 6 oz (61.4 kg)   BMI 22.53 kg/m  Wt Readings from Last 3 Encounters:  09/17/24 135 lb 6 oz (61.4 kg)  05/31/24 132 lb 12.8 oz  (60.2 kg)  04/11/24 130 lb 12.8 oz (59.3 kg)  GEN: NAD, appears stated age, doesn't appear chronically ill PSYCH: Cooperative, without pressured speech EYE: Conjunctivae pink, sclerae anicteric ENT: MMM CV: Nontachycardic RESP: CTAB posteriorly, without wheezing GI: NABS, soft, NT/ND, surgical scars present, without rebound or guarding MSK/EXT: No significant lower extremity edema SKIN: No jaundice NEURO:  Alert & Oriented x 3, no focal deficits   REVIEW OF DATA  I reviewed the following data at the time of this encounter:  GI Procedures and Studies  November 2024 colonoscopy - Perianal skin tags found on perianal exam. Hemorrhoids found on digital rectal exam. - Patent functional end-to-end ileo-colonic anastomosis, characterized by healthy appearing mucosa. - The examined portion of the ileum was normal. Biopsied. - Four 3 to 5 mm polyps in the rectum, removed with a cold snare. Resected and retrieved. - Diverticulosis in the recto-sigmoid colon. - Normal mucosa in the entire examined colon otherwise. Biopsied. - Non-bleeding non-thrombosed external and internal hemorrhoids.  Pathology FINAL MICROSCOPIC DIAGNOSIS:  A. NEOTERMINAL ILEUM, BIOPSY:  Benign ileal and colonic mucosa with no diagnostic abnormality  B. RIGHT COLON, BIOPSY:  Mild melanosis coli  Negative for activity and changes of chronicity  C. LEFT COLON, BIOPSY:  Mild melanosis coli  Negative for activity and changes of chronicity  D. RECTUM, BIOPSY:  Minimal melanosis coli  Negative for activity and changes of chronicity  E. RECTUM, POLYPECTOMY:  Hyperplastic polyp  Negative for dysplasia and carcinoma   Laboratory Studies  Reviewed those in epic  Imaging Studies  December 2024 CT enterography IMPRESSION: *There is mild irregular thickening and mucosal hyperattenuation of the neo terminal ileum in right lower quadrant, which is concerning for inflammatory bowel disease. No associated stricture, sinus  or fistula. No abscess or collection. *Multiple other nonacute observations, as described above.   ASSESSMENT  Ms. Coller is a 68 y.o. female with a pmh significant for Crohn's disease (status post previous ileocecectomy on Stelara ), diabetes, hypertension, hyperlipidemia, prior C. difficile infection, family history esophagus cancer (father).  The patient is seen today for evaluation and management of:  No diagnosis found.  The patient remains hemodynamically stable.  Clinically, she is doing okay with having been able to reinitiate her Stelara .  Unfortunately due to prior authorizations as result of every 6 week dosing, she missed nearly 2 months of Stelara  therapy.  Her most recent fecal calprotectin in January had actually normalized.  I am not sure about the patient's continuing/recurrent lower abdominal pain, as it is not clearly spasmodic but this has not been initiated previously and we will trial to see if that makes any difference for her in the next couple weeks Levsin .  Although CT enterography imaging suggested that there was potential for some underlying inflammation, her fecal calprotectin had normalized.  If there are issues, it is well above her current anastomosis so not sure that surgical interventions are required at this time, with the hope that they will never be required.  I do not think that switching her medication therapy is needed at this time unless she were to have progressive symptomatology and increasing fecal calprotectin and increasing/progressing abnormal imaging found.  For now we will continue her current Stelara  dosing.  Will plan to check Stelara  levels in the next 6 to 8 weeks.  Repeat colonoscopy in 2 years.  All patient questions were answered to the best of my ability, and the patient agrees to the aforementioned plan of action with follow-up as indicated.   PLAN  Continue Stelara  every 6 weeks as she has done for years TDM Stelara  levels in the next 6 to 8  weeks Colonoscopy otherwise in 2026 QuantiFERON gold due in 10/25 No plan for steroids at this time Levsin  twice daily as needed for next 2 weeks in case this is helpful for her pain/discomfort - If so, can give additional therapy refills otherwise. - Patient will update us  in a few weeks   No orders of the defined types were placed in this encounter.   New Prescriptions   No medications on file   Modified Medications   No medications on file    Planned Follow Up No follow-ups on file.   Total Time in Face-to-Face and in Coordination of Care for patient including independent/personal interpretation/review of prior testing, medical history, examination, medication adjustment, communicating results with the patient directly, and documentation within the EHR is 25 minutes.  Aloha Finner, MD [facility] Advanced Endoscopy Office # [phone]

## 2024-09-17 NOTE — Patient Instructions (Signed)
 Your provider has requested that you go to the basement level for lab work before leaving today. Press B on the elevator. The lab is located at the first door on the left as you exit the elevator.  We have sent the following medications to your pharmacy for you to pick up at your convenience: Suprep   CAPSULE ENDOSCOPY PATIENT INSTRUCTION SHEET  Angela [name] 1956/03/17 [id number]   10/02/24 Seven (7) days prior to capsule endoscopy stop taking iron supplements and carafate.  2. 10/03/24 Seven (7) days prior to capsule endoscopy stop taking any GLP1 medications.   3.10/07/24 Two (2) days prior to capsule endoscopy stop taking aspirin or any arthritis drugs.  4.10/08/24 Day before capsule endoscopy purchase a 238 gram bottle of Miralax  from the laxative section of your drug store, and a 32 oz. bottle of Gatorade (no red).    5.10/08/24 One (1) day prior to capsule endoscopy: Stop smoking. Eat a regular diet until 12:00 Noon. After 12:00 Noon take only the following: Black coffee  Jell-O (no fruit or red Jell-o) Water   Bouillon (chicken or beef) 7-Up   Cranberry Juice Tea   Kool-Aid Popsicle (not red) Sprite   Coke Ginger Ale  Pepsi Mountain Dew Gatorade At 6:00 pm the evening before your appointment, drink 7 capfuls (105 grams) of Miralax  with 32 oz. Gatorade. Drink 8 oz every 15 minutes until gone. Nothing to eat or drink after midnight except medications with a sip of water.  6.10/09/24 Day of capsule endoscopy:  No medications for 2 hours prior to your test.  7. Please arrive at [facility]  3rd floor patient registration area by 8:30 am on: 10/09/24.   For any questions: Call [facility] at [phone] and ask to speak with one of the capsule endoscopy nurses.  YOU WILL NEED TO RETURN THE EQUIPMENT AT 4 PM ON THE DAY OF THE PROCEDURE.  PLEASE KEEP THIS IN MIND WHEN SCHEDULING.   The above instructions have been reviewed and explained to me  by________________   Patient signature:_________________________________________     Date:________________  Small Bowel Capsule Endoscopy  What you should know: Small Bowel capsule endoscopy is a procedure that takes pictures of the inside of your small intestine (bowel).  Your small bowel connects to your stomach on one end, and your large bowel (colon) on the other.  A capsule endoscopy is done by swallowing a pill size camera.  The capsule moves through your stomach and into your small bowel, where pictures are taken.   You may need a small bowel capsule endoscopy if you have symptoms, such as blood in your stool, chronic stomach pain, and diarrhea.  The pictures may show if you have growths, swelling, and bleeding area in you small bowel.  A capsule endoscopy may also show if diseases such as Crohn's or celiac disease are causing your symptoms.  Having a small bowel capsule endoscopy may help you and your caregiver learn the cause of your symptoms.  Learning what is causing your symptoms allows you to receive needed treatment and prevent further problems. Risks: You may have stomach pain during your procedure.   The pictures taken by the capsule may not be clear.   The pictures may not show the cause of your symptoms.   You may need another endoscopy procedure.   The capsule may get trapped in your esophagus or intestines. You may need surgery or additional procedures to remove the capsule from your body.  Before your procedure: You will be instructed to stop certain prescription medications or over- the -counter medications prior to the procedure.   The day before your scheduled appointment you will need to be on a restricted diet and will need to drink a bowel prep that will clean out your bowels.   The day of the procedure: You may drive yourself to the procedure.   You will need to plan on 2 trips to the office on the day of the procedure. Morning: Plan to be at the office about  45 minutes. The morning of the procedure a sensor belt and recorder will be placed on you.  You will wear this for 8 hours.  (The sensor belt transfers pictures of your small bowel to the recorder.)   You will be given a pill-sized capsule endoscope to swallow.  Once you swallow the capsule it will travel through your body the same way food does, constantly taking pictures along the way.  The capsule takes 2-3 pictures a second.   Once you have left the office you may go about your normal day with a few exceptions: You may not go near a MRI machine or a radio or television towers; You need to avoid other patients having capsule endoscopy; You will be given a written diet to follow for the day.  Afternoon: You will need to be return to the office at your designated time. The sensors belt will be removed You will need to be at the office about 15 minutes.   _______________________________________________________  If your blood pressure at your visit was 140/90 or greater, please contact your primary care physician to follow up on this.  _______________________________________________________  If you are age 6 or older, your body mass index should be between 23-30. Your Body mass index is 22.53 kg/m. If this is out of the aforementioned range listed, please consider follow up with your Primary Care Provider.  If you are age 42 or younger, your body mass index should be between 19-25. Your Body mass index is 22.53 kg/m. If this is out of the aformentioned range listed, please consider follow up with your Primary Care Provider.   ________________________________________________________  The [facility] providers would like to encourage you to use MYCHART to communicate with providers for non-urgent requests or questions.  Due to long hold times on the telephone, sending your provider a message by [facility] may be a faster and more efficient way to get a response.  Please allow 48 business hours for  a response.  Please remember that this is for non-urgent requests.  _______________________________________________________  [facility] is using a team-based approach to care.  Your team is made up of your doctor and two to three APPS. Our APPS (Nurse Practitioners and Physician Assistants) work with your physician to ensure care continuity for you. They are fully qualified to address your health concerns and develop a treatment plan. They communicate directly with your gastroenterologist to care for you. Seeing the Advanced Practice Practitioners on your physician's team can help you by facilitating care more promptly, often allowing for earlier appointments, access to diagnostic testing, procedures, and other specialty referrals.   Thank you for choosing me and [facility].  Dr. Wilhelmenia

## 2024-09-18 ENCOUNTER — Encounter: Payer: Self-pay | Admitting: Gastroenterology

## 2024-09-18 ENCOUNTER — Telehealth: Payer: Self-pay

## 2024-09-18 DIAGNOSIS — K50819 Crohn's disease of both small and large intestine with unspecified complications: Secondary | ICD-10-CM

## 2024-09-18 DIAGNOSIS — K625 Hemorrhage of anus and rectum: Secondary | ICD-10-CM | POA: Insufficient documentation

## 2024-09-18 DIAGNOSIS — R195 Other fecal abnormalities: Secondary | ICD-10-CM | POA: Insufficient documentation

## 2024-09-18 DIAGNOSIS — R194 Change in bowel habit: Secondary | ICD-10-CM | POA: Insufficient documentation

## 2024-09-18 DIAGNOSIS — K529 Noninfective gastroenteritis and colitis, unspecified: Secondary | ICD-10-CM

## 2024-09-18 DIAGNOSIS — G8929 Other chronic pain: Secondary | ICD-10-CM | POA: Insufficient documentation

## 2024-09-18 HISTORY — DX: Hemorrhage of anus and rectum: K62.5

## 2024-09-18 NOTE — Telephone Encounter (Signed)
 Patient has been scheduled for 10/23/24 for colonoscopy in [facility] with Dr Wilhelmenia. New Instructions have been sent to patient via my-chart.

## 2024-09-18 NOTE — Telephone Encounter (Signed)
-----   Message from [facility] sent at 09/18/2024  6:18 AM EDT ----- Regarding: RE: Anesthesia in [facility] JMN, Thanks. CAFFIE Peers, FYI ----- Message ----- From: Dyan Rush, CRNA Sent: 09/18/2024   6:06 AM EDT To: Aloha Wilhelmenia Raddle., MD Subject: RE: Anesthesia in [facility]                          Dr. Wilhelmenia,  This pt is cleared for anesthetic care at [facility].  Best regards,  JMN ----- Message ----- From: Wilhelmenia Aloha Raddle., MD Sent: 09/17/2024  12:12 PM EDT To: Rush Dyan, CRNA Subject: Anesthesia in [facility]                              JN, Any reason you can see why her procedures need to be in the hospital based setting? Looks like DJ used to do them there so last procedure done there, but I can't find a reason. Thanks. GM

## 2024-09-19 LAB — TISSUE TRANSGLUTAMINASE, IGA: (tTG) Ab, IgA: <1 U/mL

## 2024-09-19 LAB — IGA: Immunoglobulin A: 248 mg/dL (ref 70–320)

## 2024-09-19 LAB — QUANTIFERON-TB GOLD PLUS
Mitogen-NIL: >10 [IU]/mL
NIL: 0.01 [IU]/mL
QuantiFERON-TB Gold Plus: NEGATIVE
TB1-NIL: 0 [IU]/mL
TB2-NIL: 0 [IU]/mL

## 2024-09-19 NOTE — Addendum Note (Signed)
 Addended by: SUELLEN PEERS on: 09/19/2024 10:42 AM   Modules accepted: Orders

## 2024-09-24 ENCOUNTER — Encounter: Payer: Self-pay | Admitting: Gastroenterology

## 2024-09-24 NOTE — Telephone Encounter (Signed)
 Dr Wilhelmenia see message from pt regarding med not working

## 2024-09-25 ENCOUNTER — Other Ambulatory Visit: Payer: Self-pay

## 2024-09-25 ENCOUNTER — Telehealth: Payer: Self-pay

## 2024-09-25 DIAGNOSIS — K50819 Crohn's disease of both small and large intestine with unspecified complications: Secondary | ICD-10-CM

## 2024-09-25 NOTE — Telephone Encounter (Signed)
 Pt needs Colon and VCE- see My Chart message

## 2024-09-25 NOTE — Telephone Encounter (Signed)
 Pt scheduled already and aware

## 2024-09-27 NOTE — Telephone Encounter (Signed)
 Looks like it is still pending Patty from when we ordered. Can we follow up with the lab to see if it was sent correctly? Thanks. GM

## 2024-10-01 ENCOUNTER — Ambulatory Visit: Admitting: Family Medicine

## 2024-10-01 ENCOUNTER — Encounter: Payer: Self-pay | Admitting: Family Medicine

## 2024-10-01 VITALS — BP 118/78 | HR 79 | Temp 98.2°F | Ht 65.0 in | Wt 133.6 lb

## 2024-10-01 DIAGNOSIS — E559 Vitamin D deficiency, unspecified: Secondary | ICD-10-CM | POA: Diagnosis not present

## 2024-10-01 DIAGNOSIS — Z23 Encounter for immunization: Secondary | ICD-10-CM

## 2024-10-01 DIAGNOSIS — Z0000 Encounter for general adult medical examination without abnormal findings: Secondary | ICD-10-CM

## 2024-10-01 DIAGNOSIS — I10 Essential (primary) hypertension: Secondary | ICD-10-CM

## 2024-10-01 DIAGNOSIS — K50819 Crohn's disease of both small and large intestine with unspecified complications: Secondary | ICD-10-CM | POA: Diagnosis not present

## 2024-10-01 DIAGNOSIS — Z7985 Long-term (current) use of injectable non-insulin antidiabetic drugs: Secondary | ICD-10-CM

## 2024-10-01 DIAGNOSIS — I700 Atherosclerosis of aorta: Secondary | ICD-10-CM

## 2024-10-01 DIAGNOSIS — E782 Mixed hyperlipidemia: Secondary | ICD-10-CM | POA: Diagnosis not present

## 2024-10-01 DIAGNOSIS — E1169 Type 2 diabetes mellitus with other specified complication: Secondary | ICD-10-CM

## 2024-10-01 DIAGNOSIS — Z1231 Encounter for screening mammogram for malignant neoplasm of breast: Secondary | ICD-10-CM

## 2024-10-01 LAB — HEMOGLOBIN A1C: Hgb A1c MFr Bld: 5.7 % (ref 4.6–6.5)

## 2024-10-01 LAB — LIPID PANEL
Cholesterol: 131 mg/dL (ref 0–200)
HDL: 43.2 mg/dL (ref >39.00)
LDL Cholesterol: 72 mg/dL (ref 0–99)
NonHDL: 87.81
Total CHOL/HDL Ratio: 3
Triglycerides: 78 mg/dL (ref 0.0–149.0)
VLDL: 15.6 mg/dL (ref 0.0–40.0)

## 2024-10-01 LAB — VITAMIN D 25 HYDROXY (VIT D DEFICIENCY, FRACTURES): VITD: 28.87 ng/mL — ABNORMAL LOW (ref 30.00–100.00)

## 2024-10-01 LAB — TSH: TSH: 1.4 u[IU]/mL (ref 0.35–5.50)

## 2024-10-01 MED ORDER — ATORVASTATIN CALCIUM 40 MG PO TABS: 40.0000 mg | ORAL_TABLET | Freq: Every day | ORAL | 3 refills | Status: DC

## 2024-10-01 MED ORDER — TETANUS-DIPHTH-ACELL PERTUSSIS 5-2.5-18.5 LF-MCG/0.5 IM SUSP: 0.5000 mL | Freq: Once | INTRAMUSCULAR | 0 refills | Status: AC

## 2024-10-01 MED ORDER — LISINOPRIL 20 MG PO TABS: 20.0000 mg | ORAL_TABLET | Freq: Every day | ORAL | 1 refills | Status: DC

## 2024-10-01 MED ORDER — MOUNJARO 12.5 MG/0.5ML ~~LOC~~ SOAJ: 12.5000 mg | SUBCUTANEOUS | 1 refills | Status: DC

## 2024-10-01 NOTE — Progress Notes (Signed)
 Patient ID: Angela [name], female  DOB: 02-Jul-1956, 68 y.o.   MRN: [id number] Patient Care Team    Relationship Specialty Notifications Start End  Catherine Charlies LABOR, DO PCP - General Family Medicine  12/02/22   [facility] ophthamology Consulting Physician Ophthalmology  05/06/21   Salina Norleen BROCKS, MD Referring Physician Neurosurgery  12/02/22   Onita Duos, MD Consulting Physician Neurology  12/02/22   Mansouraty, Aloha Raddle., MD Consulting Physician Gastroenterology  09/27/23     Chief Complaint  Patient presents with   Annual Exam    Pt is fasting.     Subjective:  Angela [name] is a 68 y.o.  Female  present for  CPE and  chronic condition management combination appointment. All past medical history, surgical history, allergies, family history, immunizations, medications and social history were updated in the electronic medical record today. All recent labs, ED visits and hospitalizations within the last year were reviewed.  Health maintenance:  Colonoscopy: completed 10/16/2023 by  Dr. Wilhelmenia - has once scheduled  Mammogram: completed: 01/05/2024-[facility] on [address].  Ordered for her today. Immunizations: tdap -printed, Influenza -declined (encouraged yearly), PNA20- completed shingrix  series completed. Infectious disease screening: HIV and Hep C completed DEXA: 12/2023 +0.7(normal)-[facility] on [address] Patient has a [facility] home. Hospitalizations/ED visits: reviewed   Type 2 diabetes mellitus with stage 3a chronic kidney disease, without long-term current use of insulin  (HCC) Patient reports compliance with Mounjaro  12.5 mg weekly.  Patient denies dizziness, hyperglycemic or hypoglycemic events. Patient denies numbness, tingling in the extremities or nonhealing wounds of feet.   Primary hypertension/Mixed hyperlipidemia/Hardening of the aorta (main artery of the heart) (HCC) Pt reports compliance with lisinopril  20 mg daily.  Patient denies chest pain, shortness of  breath, dizziness or lower extremity edema.   RF: Hypertension, hyperlipidemia, diabetes  Crohn's disease of small and large intestines with complication (HCC) Managed by Dr. Teressa. Prescribed Stelara .  Headaches: Taking b12 supplement . She is seeing improvement in her headaches.      10/01/2024    9:25 AM 09/27/2023    7:54 AM 04/03/2023   10:23 AM 12/02/2022   11:02 AM 08/24/2020    2:56 PM  Depression screen PHQ 2/9  Decreased Interest 0 0 0 0 0  Down, Depressed, Hopeless 0 0 0 0 0  PHQ - 2 Score 0 0 0 0 0  Altered sleeping 0      Tired, decreased energy 0      Change in appetite 0      Feeling bad or failure about yourself  0      Trouble concentrating 0      Moving slowly or fidgety/restless 0      Suicidal thoughts 0      PHQ-9 Score 0      Difficult doing work/chores Not difficult at all          10/01/2024    9:25 AM  GAD 7 : Generalized Anxiety Score  Nervous, Anxious, on Edge 0  Control/stop worrying 0  Worry too much - different things 0  Trouble relaxing 0  Restless 0  Easily annoyed or irritable 0  Afraid - awful might happen 0  Total GAD 7 Score 0  Anxiety Difficulty Not difficult at all          04/29/2021    8:43 AM 04/03/2023   10:23 AM 09/27/2023    7:54 AM 03/11/2024    8:43 AM 10/01/2024    9:25 AM  Fall Risk  Falls in the past year?  0 0 0 0  Was there an injury with Fall?  0 0  0  Fall Risk Category Calculator  0 0  0  (RETIRED) Patient Fall Risk Level Low fall risk       Patient at Risk for Falls Due to  No Fall Risks No Fall Risks    Fall risk Follow up  Falls evaluation completed Falls evaluation completed  Falls evaluation completed     Data saved with a previous flowsheet row definition     Immunization History  Administered Date(s) Administered   Influenza,inj,Quad PF,6+ Mos 10/07/2019   PFIZER(Purple Top)SARS-COV-2 Vaccination 02/02/2020, 02/23/2020   PNEUMOCOCCAL CONJUGATE-20 09/27/2023   Pneumococcal Conjugate-13 05/14/2018    Tdap 11/21/2008   Zoster Recombinant(Shingrix ) 09/27/2023, 03/15/2024    Past Medical History:  Diagnosis Date   Bright red blood per rectum 09/18/2024   C. difficile diarrhea    Crohn's colitis (HCC)    Cyclical vomiting syndrome 03/21/2022   Diabetes mellitus without complication (HCC)    Diplopia 05/05/2022   Family history of breast cancer    Hyperlipidemia    Hypertension    Increased frequency of headaches 05/05/2022   Intra-abdominal phelgmon (RLQ) 03/08/2018   Lower abdominal pain 08/31/2023   PONV (postoperative nausea and vomiting)    Allergies  Allergen Reactions   Humira  [Adalimumab ] Other (See Comments)    Joints aches/double vision/flu-like symptoms.   Azathioprine  Other (See Comments)   Penicillins Hives    Has patient had a PCN reaction causing immediate rash, facial/tongue/throat swelling, SOB or lightheadedness with hypotension: Yes Has patient had a PCN reaction causing severe rash involving mucus membranes or skin necrosis: No Has patient had a PCN reaction that required hospitalization: No Has patient had a PCN reaction occurring within the last 10 years: No If all of the above answers are NO, then may proceed with Cephalosporin use.    Past Surgical History:  Procedure Laterality Date   APPENDECTOMY     BIOPSY  04/29/2021   Procedure: BIOPSY;  Surgeon: Teressa Toribio SQUIBB, MD;  Location: [facility];  Service: Endoscopy;;   BIOPSY  10/16/2023   Procedure: BIOPSY;  Surgeon: Wilhelmenia Aloha Raddle., MD;  Location: [facility];  Service: Gastroenterology;;   CESAREAN SECTION     x2   CHOLECYSTECTOMY  2011   COLONOSCOPY  12/2019   COLONOSCOPY WITH PROPOFOL  N/A 04/29/2021   Procedure: COLONOSCOPY WITH PROPOFOL ;  Surgeon: Teressa Toribio SQUIBB, MD;  Location: [facility];  Service: Endoscopy;  Laterality: N/A;   COLONOSCOPY WITH PROPOFOL  N/A 10/16/2023   Procedure: COLONOSCOPY WITH PROPOFOL ;  Surgeon: Mansouraty, Aloha Raddle., MD;  Location: [facility];   Service: Gastroenterology;  Laterality: N/A;   FLEXIBLE SIGMOIDOSCOPY  03/04/2020   Procedure: DIAGNOSTIC FLEXIBLE SIGMOIDOSCOPY;  Surgeon: Teresa Lonni HERO, MD;  Location: [facility];  Service: General;;   KNEE SURGERY Right    LAPAROSCOPIC SUBTOTAL COLECTOMY N/A 03/04/2020   Procedure: LAPAROSCOPIC ILEOCOLECTOMY, LYSIS OF ADHESIONS, BILATERAL TAP BLOCK;  Surgeon: Teresa Lonni HERO, MD;  Location: [facility];  Service: General;  Laterality: N/A;   POLYPECTOMY  10/16/2023   Procedure: POLYPECTOMY;  Surgeon: Wilhelmenia Aloha Raddle., MD;  Location: [facility];  Service: Gastroenterology;;   spinal ablation  05/23/2022   SPINE SURGERY     Herniated Disc x 2   tummy tuck  2001   Family History  Problem Relation Age of Onset   Leukemia Mother    Heart failure Mother  Hypertension Mother    Diabetes Mother    Breast cancer Mother    Hearing loss Mother    Kidney disease Mother    Hypertension Father    Cancer Father    Esophageal cancer Father    Depression Father    Alcohol abuse Father    Depression Sister    Cancer Sister    Leukemia Sister 5   Cervical cancer Sister    Breast cancer Maternal Aunt 67   Breast cancer Maternal Grandmother        dx in her late 66s   Cancer Maternal Grandfather    Heart attack Paternal Grandfather    Colon cancer Neg Hx    Stomach cancer Neg Hx    Inflammatory bowel disease Neg Hx    Liver disease Neg Hx    Pancreatic cancer Neg Hx    Rectal cancer Neg Hx    Social History   Social History Narrative   Marital status/children/pets: married, 3 children   Education/employment: masters degree-retired   Safety:      -smoke alarm in the home:Yes     - wears seatbelt: Yes     - Feels safe in their relationships: Yes      Uses hearing aid       Allergies as of 10/01/2024       Reactions   Humira  [adalimumab ] Other (See Comments)   Joints aches/double vision/flu-like symptoms.   Azathioprine  Other (See Comments)   Penicillins Hives    Has patient had a PCN reaction causing immediate rash, facial/tongue/throat swelling, SOB or lightheadedness with hypotension: Yes Has patient had a PCN reaction causing severe rash involving mucus membranes or skin necrosis: No Has patient had a PCN reaction that required hospitalization: No Has patient had a PCN reaction occurring within the last 10 years: No If all of the above answers are NO, then may proceed with Cephalosporin use.        Medication List        Accurate as of October 01, 2024  9:52 AM. If you have any questions, ask your nurse or doctor.          STOP taking these medications    Wezlana 90 MG/ML Sosy Generic drug: Ustekinumab -auub Stopped by: Charlies Bellini       TAKE these medications    atorvastatin  40 MG tablet Commonly known as: LIPITOR Take 1 tablet (40 mg total) by mouth daily.   lisinopril  20 MG tablet Commonly known as: ZESTRIL  Take 1 tablet (20 mg total) by mouth daily.   Mounjaro  12.5 MG/0.5ML Pen Generic drug: tirzepatide  Inject 12.5 mg into the skin once a week. What changed: See the new instructions. Changed by: Charlies Bellini   Na Sulfate-K Sulfate-Mg Sulfate concentrate 17.5-3.13-1.6 GM/177ML Soln Commonly known as: Suprep Bowel Prep  Kit Take 1 kit (354 mLs total) by mouth as directed. For colonoscopy prep   Tdap 5-2.5-18.5 LF-MCG/0.5 injection Commonly known as: BOOSTRIX Inject 0.5 mLs into the muscle once for 1 dose. Started by: Ron Beske   Xiidra 5 % Soln Generic drug: Lifitegrast Apply 1 drop to eye 2 (two) times daily.       All past medical history, surgical history, allergies, family history, immunizations andmedications were updated in the EMR today and reviewed under the history and medication portions of their EMR.     MR BRAIN W WO CONTRAST Result Date: 06/09/2022 FINDINGS: The brain parenchyma shows mild age-related changes of chronic small vessel disease and minimal  supratentorial cortical atrophy.   No other structural lesion, tumor or infarct is noted.  Diffusion-weighted imaging is negative for acute ischemia.  SWI sequences do not show any microhemorrhages.  Subarachnoid space and ventricular system appear normal.  Cortical sulci and gyri show normal appearance.  Extra-axial brain structures appear normal.  Calvarium shows no abnormalities.  Orbits appear unremarkable.  Paranasal sinuses show mild chronic inflammatory changes.  The pituitary gland and cerebellar tonsils within normal.  Visualized portion of the upper cervical spine shows no abnormalities.  Flow-voids of large vessels of intracranial circulation appear to be patent.  Postcontrast images do not result in abnormal areas of enhancement.   Review of Systems  All other systems reviewed and are negative.  14 pt review of systems performed and negative (unless mentioned in an HPI)  Objective: BP 118/78   Pulse 79   Temp 98.2 F (36.8 C)   Ht 5' 5 (1.651 m)   Wt 133 lb 9.6 oz (60.6 kg)   SpO2 98%   BMI 22.23 kg/m  Physical Exam Vitals and nursing note reviewed.  Constitutional:      General: She is not in acute distress.    Appearance: Normal appearance. She is not ill-appearing, toxic-appearing or diaphoretic.  HENT:     Head: Normocephalic and atraumatic.     Right Ear: Tympanic membrane, ear canal and external ear normal. There is no impacted cerumen.     Left Ear: Tympanic membrane, ear canal and external ear normal. There is no impacted cerumen.     Nose: No congestion or rhinorrhea.     Mouth/Throat:     Mouth: Mucous membranes are moist.     Pharynx: Oropharynx is clear. No oropharyngeal exudate or posterior oropharyngeal erythema.  Eyes:     General: No scleral icterus.       Right eye: No discharge.        Left eye: No discharge.     Extraocular Movements: Extraocular movements intact.     Conjunctiva/sclera: Conjunctivae normal.     Pupils: Pupils are equal, round, and reactive to light.   Cardiovascular:     Rate and Rhythm: Normal rate and regular rhythm.     Pulses: Normal pulses.     Heart sounds: Normal heart sounds. No murmur heard.    No friction rub. No gallop.  Pulmonary:     Effort: Pulmonary effort is normal. No respiratory distress.     Breath sounds: Normal breath sounds. No stridor. No wheezing, rhonchi or rales.  Chest:     Chest wall: No tenderness.  Abdominal:     General: Abdomen is flat. Bowel sounds are normal. There is no distension.     Palpations: Abdomen is soft. There is no mass.     Tenderness: There is no abdominal tenderness. There is no right CVA tenderness, left CVA tenderness, guarding or rebound.     Hernia: No hernia is present.  Musculoskeletal:        General: No swelling, tenderness or deformity. Normal range of motion.     Cervical back: Normal range of motion and neck supple. No rigidity or tenderness.     Right lower leg: No edema.     Left lower leg: No edema.  Lymphadenopathy:     Cervical: No cervical adenopathy.  Skin:    General: Skin is warm and dry.     Coloration: Skin is not jaundiced or pale.     Findings: No bruising, erythema, lesion or rash.  Neurological:     General: No focal deficit present.     Mental Status: She is alert and oriented to person, place, and time. Mental status is at baseline.     Cranial Nerves: No cranial nerve deficit.     Sensory: No sensory deficit.     Motor: No weakness.     Coordination: Coordination normal.     Gait: Gait normal.     Deep Tendon Reflexes: Reflexes normal.  Psychiatric:        Mood and Affect: Mood normal.        Behavior: Behavior normal.        Thought Content: Thought content normal.        Judgment: Judgment normal.     Diabetic Foot Exam - Simple   Simple Foot Form Diabetic Foot exam was performed with the following findings: Yes 10/01/2024  9:22 AM  Visual Inspection No deformities, no ulcerations, no other skin breakdown bilaterally: Yes Sensation  Testing Intact to touch and monofilament testing bilaterally: Yes Pulse Check Posterior Tibialis and Dorsalis pulse intact bilaterally: Yes Comments     No results found.  Assessment/plan: Nafeesah Lapaglia is a 68 y.o. female present for CPE and chronic condition management  DM type 2 with diabetic mixed hyperlipidemia (HCC) Stable Continue  mounjaro  12.5 mg weekly PNA series: Completed after 67 Flu shot: declined(recommneded yearly) Microalb: UTD 03/15/2024 Foot exam: completed 10/01/2025 Eye exam: 11/2023, Dr. Lorry oph.  A1c: 5.1> 5.2>5.6>5.1>A1c collected today GFR: 02/2024  Primary hypertension/hyperlipidemia/atherosclerosis of the aorta Stable Continue lisinopril  20 mg daily Continue atorvastatin  40 mg daily Labs collected  Crohn's disease of small and large intestines with complication (HCC)/Immunosuppression due to drug therapy (HCC) Managed by GI. Vitamin D  levels UTD  Influenza vaccine needed declined Breast cancer screening by mammogram - MM 3D SCREENING MAMMOGRAM BILATERAL BREAST; Future Vitamin D  deficiency - Vitamin D  (25 hydroxy)  Routine general medical examination at a health care facility (Primary) Patient was encouraged to exercise greater than 150 minutes a week. Patient was encouraged to choose a diet filled with fresh fruits and vegetables, and lean meats. AVS provided to patient today for education/recommendation on gender specific health and safety maintenance. Colonoscopy: completed 10/16/2023 by  Dr. Wilhelmenia - has once scheduled  Mammogram: completed: 01/05/2024-[facility] on [address].  Ordered for her today. Immunizations: tdap -printed, Influenza -declined (encouraged yearly), PNA20- completed shingrix  series completed. Infectious disease screening: HIV and Hep C completed DEXA: 12/2023 +0.7(normal)-[facility] on [address]  Return in about 24 weeks (around 03/18/2025) for Routine chronic condition follow-up.   Orders Placed This Encounter   Procedures   MM 3D SCREENING MAMMOGRAM BILATERAL BREAST   Hemoglobin A1c   Lipid panel   TSH   Vitamin D  (25 hydroxy)   Meds ordered this encounter  Medications   atorvastatin  (LIPITOR) 40 MG tablet    Sig: Take 1 tablet (40 mg total) by mouth daily.    Dispense:  90 tablet    Refill:  3   lisinopril  (ZESTRIL ) 20 MG tablet    Sig: Take 1 tablet (20 mg total) by mouth daily.    Dispense:  90 tablet    Refill:  1    Requesting 1 year supply > NO   tirzepatide  (MOUNJARO ) 12.5 MG/0.5ML Pen    Sig: Inject 12.5 mg into the skin once a week.    Dispense:  6 mL    Refill:  1    Requesting 1 year supply> NO  Tdap (BOOSTRIX) 5-2.5-18.5 LF-MCG/0.5 injection    Sig: Inject 0.5 mLs into the muscle once for 1 dose.    Dispense:  0.5 mL    Refill:  0   Referral Orders  No referral(s) requested today     Electronically signed by: Charlies Bellini, DO [facility]

## 2024-10-01 NOTE — Patient Instructions (Addendum)
 Return in about 24 weeks (around 03/18/2025) for Routine chronic condition follow-up.        Great to see you today.  I have refilled the medication(s) we provide.   If labs were collected or images ordered, we will inform you of  results once we have received them and reviewed. We will contact you either by echart message, or telephone call.  Please give ample time to the testing facility, and our office to run,  receive and review results. Please do not call inquiring of results, even if you can see them in your chart. We will contact you as soon as we are able. If it has been over 1 week since the test was completed, and you have not yet heard from us , then please call us .    - echart message- for normal results that have been seen by the patient already.   - telephone call: abnormal results or if patient has not viewed results in their echart.  If a referral to a specialist was entered for you, please call us  in 2 weeks if you have not heard from the specialist office to schedule.

## 2024-10-02 ENCOUNTER — Ambulatory Visit: Payer: Self-pay | Admitting: Family Medicine

## 2024-10-04 ENCOUNTER — Encounter: Payer: Self-pay | Admitting: Gastroenterology

## 2024-10-04 LAB — USTEKINUMAB AND ANTI-USTEK AB
Anti-Ustekinumab Antibody: <40 ng/mL
Ustekinumab: 12 ug/mL

## 2024-10-04 NOTE — Telephone Encounter (Signed)
 The VCE and Colonoscopy will certainly help but if they are normal she may still need the imaging study. I think completing those would be the first thing to do. If they showed abnormalities, that were concerning for fistulous connections then we would go from there. GM

## 2024-10-04 NOTE — Telephone Encounter (Signed)
 It is not unreasonable to consider whether other complications from Crohn's disease could be occurring. As documented on the Stelara  drug levels and antibody levels (released the results note to you earlier today), one would expect that if she was having issues in regards to potential fistula from the colon or small bowel to the bladder, you would expect to see some sort of findings of inflammation (and she has not on her inflammatory markers or enterography from January of this year).  But since she is having further issues, the first thing would be to repeat a CT abdomen/pelvis.  I recommend this to be a CT enterography. If the CT enterography shows anything that is concerning for bowel apposition to the bladder, then a barium enema number or a pelvic MRI can be considered further. We would not order that without some sort of imaging finding first on cross-sectional CT. You may offer this to the patient and if she is in agreement, we can move forward with a CT enterography. This still does not take away the need for video capsule endoscopy and colonoscopy which we have the [facility].  Deviously. Let me know if she wants to move forward with the enterography. Thanks. GM

## 2024-10-09 ENCOUNTER — Encounter

## 2024-10-14 ENCOUNTER — Encounter: Payer: Self-pay | Admitting: Family Medicine

## 2024-10-14 ENCOUNTER — Encounter: Payer: Self-pay | Admitting: Gastroenterology

## 2024-10-15 ENCOUNTER — Other Ambulatory Visit: Payer: Self-pay

## 2024-10-15 ENCOUNTER — Ambulatory Visit: Admitting: Gastroenterology

## 2024-10-15 DIAGNOSIS — K5090 Crohn's disease, unspecified, without complications: Secondary | ICD-10-CM

## 2024-10-15 MED ORDER — ATORVASTATIN CALCIUM 40 MG PO TABS
40.0000 mg | ORAL_TABLET | Freq: Every day | ORAL | 3 refills | Status: AC
Start: 2024-10-15 — End: ?

## 2024-10-15 MED ORDER — LISINOPRIL 20 MG PO TABS
20.0000 mg | ORAL_TABLET | Freq: Every day | ORAL | 1 refills | Status: AC
Start: 2024-10-15 — End: ?

## 2024-10-15 NOTE — Patient Instructions (Signed)
 You may have clear liquids beginning at 10:30 am after ingesting the capsule.    You can have a light lunch at 12:30 pm; sandwich and half bowl of soup.  Return to the office at 4 pm to return the equipment.   Return to you normal diet at 5 pm.   Call [phone] and ask for Daphne Moats, RN if you have any questions.  You should pass the capsule in your stool 8-48 hours after ingestion. If you have not passed the capsule, after 72 hours, please contact the office at [phone].

## 2024-10-15 NOTE — Progress Notes (Signed)
 SN: [id number] Exp: 11/22/2025 LOT: [id number]  Patient arrived for VCE. Reported the prep went well. This RN explained capsule dietary restrictions for the next few hours. Pt advised to return at 4 pm to return capsule equipment.  Patient verbalized understanding. Opened capsule, ensured capsule was flashing prior to the patient swallowing the capsule. Patient swallowed capsule without difficulty.  Patient told to call the office with any questions and if capsule has not passed after 72 hours. No further questions by the conclusion of the visit.

## 2024-10-21 ENCOUNTER — Telehealth: Payer: Self-pay

## 2024-10-21 DIAGNOSIS — Z189 Retained foreign body fragments, unspecified material: Secondary | ICD-10-CM

## 2024-10-21 DIAGNOSIS — K50819 Crohn's disease of both small and large intestine with unspecified complications: Secondary | ICD-10-CM

## 2024-10-21 NOTE — Telephone Encounter (Signed)
 The pt has been advised- she is not sure she passed the capsule.  She will have KUB- order entered   She has agreed to the capsule. Rescheduled to 11/06/24   New instructions sent- referral entered

## 2024-10-21 NOTE — Telephone Encounter (Signed)
-----   Message from [facility] sent at 10/20/2024  3:36 PM EST ----- Regarding: RE: Capsule AE, Thank you for this update. Agree with getting it checked out to ensure we don't have any other issues for any other patients.  Christie Viscomi, Is let patient know that the capsule was incomplete. She will not be charged and reschedule her as soon as you can for a repeat video capsule endoscopy. In the interim, before her video capsule endoscopy is rescheduled, find out if she passed the capsule or order a KUB to view to ensure that the capsule has passed. Thank you. GM. ----- Message ----- From: Ever Greig RAMAN, PA-C Sent: 10/20/2024   2:06 PM EST To: Aloha Wilhelmenia Raddle., MD Subject: Capsule                                        The Capsule study was Incomplete on this pt- I think Capsule failure as it only recorded though  1 hr 24 minutes. Pt with hx of Crohn's.  Pt should not be charged, and will need to be rescheduled  for another Capsule study  Report is on your desk, thanks  I am messaging Sheri to check Capsule batch -

## 2024-10-22 NOTE — Progress Notes (Signed)
 Video capsule endoscopy report  Incomplete capsule study with capsule failure. Only recorded 1 hour 24 minutes. This study was being done for evaluation of possible active Crohn's disease and is not able to be completed. No charge will be performed. The procedure note will be scanned into the chart and mail to the patient for her records as well.   Aloha Finner, MD [facility] Office # [phone]

## 2024-10-23 ENCOUNTER — Encounter: Payer: Self-pay | Admitting: Gastroenterology

## 2024-10-23 ENCOUNTER — Ambulatory Visit: Admitting: Gastroenterology

## 2024-10-23 ENCOUNTER — Ambulatory Visit
Admission: RE | Admit: 2024-10-23 | Discharge: 2024-10-23 | Disposition: A | Source: Ambulatory Visit | Attending: Gastroenterology | Admitting: Gastroenterology

## 2024-10-23 VITALS — BP 113/55 | HR 67 | Temp 97.9°F | Resp 12 | Ht 65.0 in | Wt 135.0 lb

## 2024-10-23 DIAGNOSIS — K529 Noninfective gastroenteritis and colitis, unspecified: Secondary | ICD-10-CM | POA: Diagnosis present

## 2024-10-23 DIAGNOSIS — Z189 Retained foreign body fragments, unspecified material: Secondary | ICD-10-CM

## 2024-10-23 DIAGNOSIS — K50819 Crohn's disease of both small and large intestine with unspecified complications: Secondary | ICD-10-CM

## 2024-10-23 DIAGNOSIS — K5000 Crohn's disease of small intestine without complications: Secondary | ICD-10-CM | POA: Diagnosis not present

## 2024-10-23 DIAGNOSIS — K641 Second degree hemorrhoids: Secondary | ICD-10-CM | POA: Diagnosis not present

## 2024-10-23 DIAGNOSIS — K6289 Other specified diseases of anus and rectum: Secondary | ICD-10-CM | POA: Diagnosis not present

## 2024-10-23 DIAGNOSIS — K5730 Diverticulosis of large intestine without perforation or abscess without bleeding: Secondary | ICD-10-CM | POA: Diagnosis not present

## 2024-10-23 DIAGNOSIS — K644 Residual hemorrhoidal skin tags: Secondary | ICD-10-CM | POA: Diagnosis not present

## 2024-10-23 MED ORDER — SODIUM CHLORIDE 0.9 % IV SOLN: 500.0000 mL | INTRAVENOUS | Status: DC

## 2024-10-23 NOTE — Op Note (Signed)
 [facility] Patient Name: Angela [name] Procedure Date: 10/23/2024 9:26 AM MRN: [id number] Endoscopist: Aloha Finner , MD, [id number] Age: 68 Referring MD:  Date of Birth: 1956/09/19 Gender: Female Account #: [id number] Procedure:                Colonoscopy Indications:              Determine extent and severity of inflammatory bowel                            disease, Crohn's disease of the small bowel and                            colon, Disease activity assessment of Crohn's                            disease of the small bowel and colon, Assess                            therapeutic response to therapy of Crohn's disease                            of the small bowel and colon, Elevated fecal                            calprotectin Medicines:                Monitored Anesthesia Care Procedure:                Pre-Anesthesia Assessment:                           - Prior to the procedure, a History and Physical                            was performed, and patient medications and                            allergies were reviewed. The patient's tolerance of                            previous anesthesia was also reviewed. The risks                            and benefits of the procedure and the sedation                            options and risks were discussed with the patient.                            All questions were answered, and informed consent                            was obtained. Prior Anticoagulants: The patient has  taken no anticoagulant or antiplatelet agents. ASA                            Grade Assessment: II - A patient with mild systemic                            disease. After reviewing the risks and benefits,                            the patient was deemed in satisfactory condition to                            undergo the procedure.                           After obtaining informed consent, the colonoscope                             was passed under direct vision. Throughout the                            procedure, the patient's blood pressure, pulse, and                            oxygen saturations were monitored continuously. The                            Olympus Scope SN [id number] was introduced through the                            anus and advanced to the the ileocolonic                            anastomosis. The colonoscopy was performed without                            difficulty. The patient tolerated the procedure.                            The quality of the bowel preparation was adequate.                            The terminal ileum was photographed. Scope In: 9:42:20 AM Scope Out: 10:01:26 AM Scope Withdrawal Time: 0 hours 14 minutes 29 seconds  Total Procedure Duration: 0 hours 19 minutes 6 seconds  Findings:                 Skin tags were found on perianal exam.                           The digital rectal exam findings include                            hemorrhoids. Pertinent negatives include no  palpable rectal lesions.                           There was evidence of a prior end-to-end                            ileo-colonic anastomosis in the ascending colon.                            This was patent and was characterized by healthy                            appearing mucosa. The anastomosis was traversed.                           Patchy inflammation was found in the neo-terminal                            Ileum (only able to go 10 cm into the neoterminal                            ileum due to scope passage). The inflammation was                            graded as Rutgeerts Score i2 (more than five                            aphthous lesions with normal intervening mucosa or                            skip areas of larger lesions or lesions confined to                            the ileocolonic anastomosis). Biopsies were taken                             with a cold forceps for histology.                           Multiple small-mouthed diverticula were found in                            the recto-sigmoid colon.                           Mild segmental areas of granular mucosa were found                            interspersed in the entire colon (majority of the                            colon is healty in appearance). Biopsies were taken  with a cold forceps for histology from the right                            colon. Biopsies were taken with a cold forceps for                            histology from the left colon. Biopsies were taken                            with a cold forceps for histology from the rectum.                           Non-bleeding non-thrombosed internal hemorrhoids                            were found during retroflexion, during perianal                            exam and during digital exam. The hemorrhoids were                            Grade II (internal hemorrhoids that prolapse but                            reduce spontaneously). Complications:            No immediate complications. Estimated Blood Loss:     Estimated blood loss was minimal. Impression:               - Perianal skin tags found on perianal exam.                            Hemorrhoids found on digital rectal exam.                           - Patent end-to-end ileo-colonic anastomosis,                            characterized by healthy appearing mucosa.                           - Ileitis in the neo-terminal ileum is suspected                            (only 10 cm able to be intubated). This was graded                            as Rutgeerts Score i2 (more than five aphthous                            lesions with normal intervening mucosa or skip                            areas of larger lesions or lesions confined to the  ileocolonic anastomosis). Biopsied.                            - Mild granularity in the entire examined colon.                            Biopsied.                           - Diverticulosis in rectosigmoid colon.                           - Non-bleeding non-thrombosed internal hemorrhoids. Recommendation:           - The patient will be observed post-procedure,                            until all discharge criteria are met.                           - Discharge patient to home.                           - Patient has a contact number available for                            emergencies. The signs and symptoms of potential                            delayed complications were discussed with the                            patient. Return to normal activities tomorrow.                            Written discharge instructions were provided to the                            patient.                           - Resume previous diet.                           - Continue present medications.                           - Await pathology results.                           - Repeat colonoscopy for surveillance based on                            pathology results.                           - Proceed with planned KUB to ensure VCE has passed.                           -  Based on pathology will consider repeat VCE +/-                            CT-Enterography.                           - Patient states feeling better currently on                            dosing, so would not make any Stelara  biosimilar                            adjustment at this time.                           - The findings and recommendations were discussed                            with the patient.                           - The findings and recommendations were discussed                            with the patient's family. Aloha Finner, MD 10/23/2024 10:10:07 AM

## 2024-10-23 NOTE — Patient Instructions (Signed)
 Thank you for letting us  take care of your healthcare needs today. Please see handouts given to you on Diverticulosis. Await pathology results prior to further testing.   YOU HAD AN ENDOSCOPIC PROCEDURE TODAY AT THE [facility]:   Refer to the procedure report that was given to you for any specific questions about what was found during the examination.  If the procedure report does not answer your questions, please call your gastroenterologist to clarify.  If you requested that your care partner not be given the details of your procedure findings, then the procedure report has been included in a sealed envelope for you to review at your convenience later.  YOU SHOULD EXPECT: Some feelings of bloating in the abdomen. Passage of more gas than usual.  Walking can help get rid of the air that was put into your GI tract during the procedure and reduce the bloating. If you had a lower endoscopy (such as a colonoscopy or flexible sigmoidoscopy) you may notice spotting of blood in your stool or on the toilet paper. If you underwent a bowel prep for your procedure, you may not have a normal bowel movement for a few days.  Please Note:  You might notice some irritation and congestion in your nose or some drainage.  This is from the oxygen used during your procedure.  There is no need for concern and it should clear up in a day or so.  SYMPTOMS TO REPORT IMMEDIATELY:  Following lower endoscopy (colonoscopy or flexible sigmoidoscopy):  Excessive amounts of blood in the stool  Significant tenderness or worsening of abdominal pains  Swelling of the abdomen that is new, acute  Fever of 100F or higher   For urgent or emergent issues, a gastroenterologist can be reached at any hour by calling [phone]. Do not use MyChart messaging for urgent concerns.    DIET:  We do recommend a small meal at first, but then you may proceed to your regular diet.  Drink plenty of fluids but you should avoid  alcoholic beverages for 24 hours.  ACTIVITY:  You should plan to take it easy for the rest of today and you should NOT DRIVE or use heavy machinery until tomorrow (because of the sedation medicines used during the test).    FOLLOW UP: Our staff will call the number listed on your records the next business day following your procedure.  We will call around 7:15- 8:00 am to check on you and address any questions or concerns that you may have regarding the information given to you following your procedure. If we do not reach you, we will leave a message.     If any biopsies were taken you will be contacted by phone or by letter within the next 1-3 weeks.  Please call us  at [phone] if you have not heard about the biopsies in 3 weeks.    SIGNATURES/CONFIDENTIALITY: You and/or your care partner have signed paperwork which will be entered into your electronic medical record.  These signatures attest to the fact that that the information above on your After Visit Summary has been reviewed and is understood.  Full responsibility of the confidentiality of this discharge information lies with you and/or your care-partner.

## 2024-10-23 NOTE — Progress Notes (Signed)
 Report to PACU, RN, vss, BBS= Clear.

## 2024-10-23 NOTE — Progress Notes (Signed)
 GASTROENTEROLOGY PROCEDURE H&P NOTE   Primary Care Physician: Catherine Charlies LABOR, DO  HPI: Angela [name] is a 68 y.o. female who presents for Colonoscopy for evaluation of Crohn's disease with symptoms concerning for persistent disease and elevated fecal calprotectin.  Past Medical History:  Diagnosis Date   Bright red blood per rectum 09/18/2024   C. difficile diarrhea    Crohn's colitis (HCC)    Cyclical vomiting syndrome 03/21/2022   Diabetes mellitus without complication (HCC)    Diplopia 05/05/2022   Family history of breast cancer    Hyperlipidemia    Hypertension    Increased frequency of headaches 05/05/2022   Intra-abdominal phelgmon (RLQ) 03/08/2018   Lower abdominal pain 08/31/2023   PONV (postoperative nausea and vomiting)    Past Surgical History:  Procedure Laterality Date   APPENDECTOMY     BIOPSY  04/29/2021   Procedure: BIOPSY;  Surgeon: Teressa Toribio SQUIBB, MD;  Location: [facility];  Service: Endoscopy;;   BIOPSY  10/16/2023   Procedure: BIOPSY;  Surgeon: Wilhelmenia Aloha Raddle., MD;  Location: [facility];  Service: Gastroenterology;;   CESAREAN SECTION     x2   CHOLECYSTECTOMY  2011   COLONOSCOPY  12/2019   COLONOSCOPY WITH PROPOFOL  N/A 04/29/2021   Procedure: COLONOSCOPY WITH PROPOFOL ;  Surgeon: Teressa Toribio SQUIBB, MD;  Location: [facility];  Service: Endoscopy;  Laterality: N/A;   COLONOSCOPY WITH PROPOFOL  N/A 10/16/2023   Procedure: COLONOSCOPY WITH PROPOFOL ;  Surgeon: Mansouraty, Aloha Raddle., MD;  Location: [facility];  Service: Gastroenterology;  Laterality: N/A;   FLEXIBLE SIGMOIDOSCOPY  03/04/2020   Procedure: DIAGNOSTIC FLEXIBLE SIGMOIDOSCOPY;  Surgeon: Teresa Lonni HERO, MD;  Location: [facility];  Service: General;;   KNEE SURGERY Right    LAPAROSCOPIC SUBTOTAL COLECTOMY N/A 03/04/2020   Procedure: LAPAROSCOPIC ILEOCOLECTOMY, LYSIS OF ADHESIONS, BILATERAL TAP BLOCK;  Surgeon: Teresa Lonni HERO, MD;  Location: [facility];  Service: General;   Laterality: N/A;   POLYPECTOMY  10/16/2023   Procedure: POLYPECTOMY;  Surgeon: Wilhelmenia Aloha Raddle., MD;  Location: [facility];  Service: Gastroenterology;;   spinal ablation  05/23/2022   SPINE SURGERY     Herniated Disc x 2   tummy tuck  2001   Current Outpatient Medications  Medication Sig Dispense Refill   atorvastatin  (LIPITOR) 40 MG tablet Take 1 tablet (40 mg total) by mouth daily. 90 tablet 3   lisinopril  (ZESTRIL ) 20 MG tablet Take 1 tablet (20 mg total) by mouth daily. 90 tablet 1   tirzepatide  (MOUNJARO ) 12.5 MG/0.5ML Pen Inject 12.5 mg into the skin once a week. 6 mL 1   XIIDRA 5 % SOLN Apply 1 drop to eye 2 (two) times daily.     Current Facility-Administered Medications  Medication Dose Route Frequency Provider Last Rate Last Admin   0.9 %  sodium chloride  infusion  500 mL Intravenous Continuous Mansouraty, Oda Placke Jr., MD        Current Outpatient Medications:    atorvastatin  (LIPITOR) 40 MG tablet, Take 1 tablet (40 mg total) by mouth daily., Disp: 90 tablet, Rfl: 3   lisinopril  (ZESTRIL ) 20 MG tablet, Take 1 tablet (20 mg total) by mouth daily., Disp: 90 tablet, Rfl: 1   tirzepatide  (MOUNJARO ) 12.5 MG/0.5ML Pen, Inject 12.5 mg into the skin once a week., Disp: 6 mL, Rfl: 1   XIIDRA 5 % SOLN, Apply 1 drop to eye 2 (two) times daily., Disp: , Rfl:   Current Facility-Administered Medications:    0.9 %  sodium chloride  infusion,  500 mL, Intravenous, Continuous, Mansouraty, Aloha Raddle., MD Allergies  Allergen Reactions   Humira  [Adalimumab ] Other (See Comments)    Joints aches/double vision/flu-like symptoms.   Penicillins Hives    Has patient had a PCN reaction causing immediate rash, facial/tongue/throat swelling, SOB or lightheadedness with hypotension: Yes Has patient had a PCN reaction causing severe rash involving mucus membranes or skin necrosis: No Has patient had a PCN reaction that required hospitalization: No Has patient had a PCN reaction occurring  within the last 10 years: No If all of the above answers are NO, then may proceed with Cephalosporin use.    Azathioprine  Other (See Comments)   Family History  Problem Relation Age of Onset   Leukemia Mother    Heart failure Mother    Hypertension Mother    Diabetes Mother    Breast cancer Mother    Hearing loss Mother    Kidney disease Mother    Hypertension Father    Cancer Father    Esophageal cancer Father    Depression Father    Alcohol abuse Father    Depression Sister    Cancer Sister    Leukemia Sister 5   Cervical cancer Sister    Breast cancer Maternal Aunt 18   Breast cancer Maternal Grandmother        dx in her late 42s   Cancer Maternal Grandfather    Heart attack Paternal Grandfather    Colon cancer Neg Hx    Stomach cancer Neg Hx    Inflammatory bowel disease Neg Hx    Liver disease Neg Hx    Pancreatic cancer Neg Hx    Rectal cancer Neg Hx    Social History   Socioeconomic History   Marital status: Married    Spouse name: Not on file   Number of children: 3   Years of education: 18   Highest education level: Master's degree (e.g., MA, MS, MEng, MEd, MSW, MBA)  Occupational History   Occupation: realtor  Tobacco Use   Smoking status: Former    Types: Cigarettes   Smokeless tobacco: Never  Vaping Use   Vaping status: Never Used  Substance and Sexual Activity   Alcohol use: Not Currently    Comment: occasionally   Drug use: Never   Sexual activity: Yes    Comment: married  Other Topics Concern   Not on file  Social History Narrative   Marital status/children/pets: married, 3 children   Education/employment: Agricultural Engineer:      -smoke alarm in the home:Yes     - wears seatbelt: Yes     - Feels safe in their relationships: Yes      Uses hearing aid      Social Drivers of Health   Financial Resource Strain: Low Risk  (09/27/2024)   Overall Financial Resource Strain (CARDIA)    Difficulty of Paying Living Expenses:  Not hard at all  Food Insecurity: No Food Insecurity (09/27/2024)   Hunger Vital Sign    Worried About Running Out of Food in the Last Year: Never true    Ran Out of Food in the Last Year: Never true  Transportation Needs: No Transportation Needs (09/27/2024)   PRAPARE - Administrator, Civil Service (Medical): No    Lack of Transportation (Non-Medical): No  Physical Activity: Sufficiently Active (09/27/2024)   Exercise Vital Sign    Days of Exercise per Week: 2 days    Minutes of Exercise per  Session: 90 min  Stress: No Stress Concern Present (09/27/2024)   [employer] of Occupational Health - Occupational Stress Questionnaire    Feeling of Stress: Only a little  Social Connections: Moderately Isolated (09/27/2024)   Social Connection and Isolation Panel    Frequency of Communication with Friends and Family: Once a week    Frequency of Social Gatherings with Friends and Family: Once a week    Attends Religious Services: Never    Database Administrator or Organizations: Yes    Attends Engineer, Structural: More than 4 times per year    Marital Status: Married  Catering Manager Violence: Not on file    Physical Exam: Today's Vitals   10/23/24 0913 10/23/24 0917  BP: 101/68   Pulse: 84   Temp: 97.9 F (36.6 C) 97.9 F (36.6 C)  SpO2: 97%   Weight: 135 lb (61.2 kg)   Height: 5' 5 (1.651 m)    Body mass index is 22.47 kg/m. GEN: NAD EYE: Sclerae anicteric ENT: MMM CV: Non-tachycardic GI: Soft, NT/ND NEURO:  Alert & Oriented x 3  Lab Results: No results for input(s): WBC, HGB, HCT, PLT in the last 72 hours. BMET No results for input(s): NA, K, CL, CO2, GLUCOSE, BUN, CREATININE, CALCIUM  in the last 72 hours. LFT No results for input(s): PROT, ALBUMIN , AST, ALT, ALKPHOS, BILITOT, BILIDIR, IBILI in the last 72 hours. PT/INR No results for input(s): LABPROT, INR in the last 72 hours.   Impression /  Plan: This is a 69 y.o.female  who presents for Colonoscopy for evaluation of Crohn's disease with symptoms concerning for persistent disease and elevated fecal calprotectin.  The risks and benefits of endoscopic evaluation/treatment were discussed with the patient and/or family; these include but are not limited to the risk of perforation, infection, bleeding, missed lesions, lack of diagnosis, severe illness requiring hospitalization, as well as anesthesia and sedation related illnesses.  The patient's history has been reviewed, patient examined, no change in status, and deemed stable for procedure.  The patient and/or family was provided an opportunity to ask questions and all were answered.  The patient and/or family is agreeable to proceed.    Aloha Finner, MD [facility] Office # [phone]

## 2024-10-23 NOTE — Progress Notes (Signed)
 Called to room to assist during endoscopic procedure.  Patient ID and intended procedure confirmed with present staff. Received instructions for my participation in the procedure from the performing physician.

## 2024-10-24 ENCOUNTER — Telehealth: Payer: Self-pay

## 2024-10-24 NOTE — Telephone Encounter (Signed)
  Follow up Call-     10/23/2024    9:17 AM  Call back number  Post procedure Call Back phone  # [phone]  Permission to leave phone message Yes     Patient questions:  Do you have a fever, pain , or abdominal swelling? No. Pain Score  0 *  Have you tolerated food without any problems? Yes.    Have you been able to return to your normal activities? Yes.    Do you have any questions about your discharge instructions: Diet   No. Medications  No. Follow up visit  No.  Do you have questions or concerns about your Care? No.  Actions: * If pain score is 4 or above: No action needed, pain <4.

## 2024-10-25 LAB — SURGICAL PATHOLOGY

## 2024-10-27 ENCOUNTER — Ambulatory Visit: Payer: Self-pay | Admitting: Gastroenterology

## 2024-10-30 ENCOUNTER — Ambulatory Visit: Payer: Self-pay | Admitting: Gastroenterology

## 2024-11-05 ENCOUNTER — Encounter: Payer: Self-pay | Admitting: Gastroenterology

## 2024-11-06 ENCOUNTER — Encounter

## 2024-11-11 ENCOUNTER — Other Ambulatory Visit: Payer: Self-pay | Admitting: Obstetrics and Gynecology

## 2024-11-11 ENCOUNTER — Encounter: Payer: Self-pay | Admitting: Obstetrics and Gynecology

## 2024-11-11 ENCOUNTER — Ambulatory Visit: Admitting: Obstetrics and Gynecology

## 2024-11-11 ENCOUNTER — Other Ambulatory Visit (HOSPITAL_COMMUNITY)
Admission: RE | Admit: 2024-11-11 | Discharge: 2024-11-11 | Disposition: A | Discharge status: Home / Self Care | Source: Ambulatory Visit | Attending: Obstetrics and Gynecology | Admitting: Obstetrics and Gynecology

## 2024-11-11 VITALS — BP 130/84 | HR 77 | Ht 64.7 in | Wt 129.4 lb

## 2024-11-11 DIAGNOSIS — R350 Frequency of micturition: Secondary | ICD-10-CM | POA: Insufficient documentation

## 2024-11-11 DIAGNOSIS — Z01419 Encounter for gynecological examination (general) (routine) without abnormal findings: Secondary | ICD-10-CM

## 2024-11-11 DIAGNOSIS — N898 Other specified noninflammatory disorders of vagina: Secondary | ICD-10-CM | POA: Insufficient documentation

## 2024-11-11 DIAGNOSIS — R351 Nocturia: Secondary | ICD-10-CM | POA: Insufficient documentation

## 2024-11-11 DIAGNOSIS — R151 Fecal smearing: Secondary | ICD-10-CM | POA: Insufficient documentation

## 2024-11-11 DIAGNOSIS — Z87440 Personal history of urinary (tract) infections: Secondary | ICD-10-CM | POA: Diagnosis not present

## 2024-11-11 DIAGNOSIS — N390 Urinary tract infection, site not specified: Secondary | ICD-10-CM | POA: Insufficient documentation

## 2024-11-11 LAB — POCT URINALYSIS DIP (CLINITEK)
Bilirubin, UA: NEGATIVE
Blood, UA: NEGATIVE
Glucose, UA: NEGATIVE mg/dL
Ketones, POC UA: NEGATIVE mg/dL
Leukocytes, UA: NEGATIVE
Nitrite, UA: POSITIVE — AB
POC PROTEIN,UA: NEGATIVE
Spec Grav, UA: 1.025
Urobilinogen, UA: 0.2 U/dL
pH, UA: 6

## 2024-11-11 MED ORDER — TROSPIUM CHLORIDE 20 MG PO TABS: 20.0000 mg | ORAL_TABLET | Freq: Two times a day (BID) | ORAL | 5 refills | Status: AC

## 2024-11-11 MED ORDER — MIRABEGRON ER 25 MG PO TB24: 25.0000 mg | ORAL_TABLET | Freq: Every day | ORAL | 5 refills | Status: AC

## 2024-11-11 MED ORDER — ESTRADIOL 0.01 % VA CREA
TOPICAL_CREAM | VAGINAL | 11 refills | Status: AC
  Filled 2024-12-27: qty 42.5, 100d supply, fill #0

## 2024-11-11 NOTE — Assessment & Plan Note (Addendum)
-   For treatment of recurrent urinary tract infections, we discussed management of recurrent UTIs including prophylaxis with , transvaginal estrogen therapy. Since she has not had a positive culture since July, so will hold off on any prophylactic antibiotics.  - We discussed increasing hydration and decreasing acidic beverages as urine odor is not always indicative of UTI but can be impacted by diet.  - Will send urine for culture today due to presence of nitrites on cath sample

## 2024-11-11 NOTE — Assessment & Plan Note (Signed)
-  For symptomatic vaginal atrophy options include lubrication with a water-based lubricant, personal hygiene measures and moisturizers, and estrogen replacement in the form of vaginal cream, vaginal tablets, or a time-released vaginal ring.   - Will start estrace  cream. Also recommended daily vaginal moisture with coconut oil or vitamin E. Recommended silicone based lubricant for intercourse.

## 2024-11-11 NOTE — Assessment & Plan Note (Signed)
-   Noted on exam today but patient denies this is occurring at home.  - We discussed hygiene and using a squirt bottle to clean after bowel movements, as fecal contamination can increase frequency of urinary tract infections.

## 2024-11-11 NOTE — Progress Notes (Signed)
 " New Patient Evaluation and Consultation  Referring Provider: Candise Aleene DEL, MD PCP: Catherine Charlies LABOR, DO Date of Service: 11/11/2024  SUBJECTIVE Chief Complaint: New Patient (Initial Visit) Angela [name] is a 68 y.o. female is here for recurrent UTI.)  History of Present Illness: Angela [name] is a 68 y.o. White or Caucasian female seen in consultation at the request of Dr Aleene Candise for evaluation of urinary tract infections.     Urinary Symptoms: Does not leak urine.   Day time voids 8.  Nocturia: 3-5 times per night to void. Has not been on medication for her bladder previously. She is bothered by her nighttime symptoms.  Voiding dysfunction:  empties bladder well.  Patient does not use a catheter to empty bladder.  When urinating, patient feels difficulty starting urine stream Drinks: less than a bottle of caffeine free diet coke, 1 glass of water per day. Avoids drinking after dinner.   UTIs: 2 UTI's in the last year.  Typically just has urine odor.  Denies history of blood in urine and kidney or bladder stones Urine cultures:  05/31/24- 50-100,000 E.Coli, pansensitive 04/11/24- 50-100,000 E.Coli, pansensitive  Pelvic Organ Prolapse Symptoms:                  Patient Denies a feeling of a bulge the vaginal area.   Bowel Symptom: Bowel movements: 2 time(s) per day Stool consistency: soft  Straining: no.  Splinting: no.  Incomplete evacuation: no.  Patient Denies accidental bowel leakage / fecal incontinence Bowel regimen: none   Sexual Function Sexually active: yes.  Sexual orientation: heterosexual Pain with sex: Yes, deep in the pelvis, has discomfort due to dryness  Pelvic Pain Denies pelvic pain    Past Medical History:  Past Medical History:  Diagnosis Date   Bright red blood per rectum 09/18/2024   C. difficile diarrhea    Crohn's colitis (HCC)    Cyclical vomiting syndrome 03/21/2022   Diabetes mellitus without complication (HCC)     Diplopia 05/05/2022   Family history of breast cancer    Hyperlipidemia    Hypertension    Increased frequency of headaches 05/05/2022   Intra-abdominal phelgmon (RLQ) 03/08/2018   Lower abdominal pain 08/31/2023   PONV (postoperative nausea and vomiting)      Past Surgical History:   Past Surgical History:  Procedure Laterality Date   APPENDECTOMY     BIOPSY  04/29/2021   Procedure: BIOPSY;  Surgeon: Teressa Toribio SQUIBB, MD;  Location: [facility];  Service: Endoscopy;;   BIOPSY  10/16/2023   Procedure: BIOPSY;  Surgeon: Wilhelmenia Aloha Raddle., MD;  Location: [facility];  Service: Gastroenterology;;   CESAREAN SECTION     x2   CHOLECYSTECTOMY  2011   COLONOSCOPY  12/2019   COLONOSCOPY WITH PROPOFOL  N/A 04/29/2021   Procedure: COLONOSCOPY WITH PROPOFOL ;  Surgeon: Teressa Toribio SQUIBB, MD;  Location: [facility];  Service: Endoscopy;  Laterality: N/A;   COLONOSCOPY WITH PROPOFOL  N/A 10/16/2023   Procedure: COLONOSCOPY WITH PROPOFOL ;  Surgeon: Mansouraty, Aloha Raddle., MD;  Location: [facility];  Service: Gastroenterology;  Laterality: N/A;   FLEXIBLE SIGMOIDOSCOPY  03/04/2020   Procedure: DIAGNOSTIC FLEXIBLE SIGMOIDOSCOPY;  Surgeon: Teresa Lonni HERO, MD;  Location: [facility];  Service: General;;   KNEE SURGERY Right    LAPAROSCOPIC SUBTOTAL COLECTOMY N/A 03/04/2020   Procedure: LAPAROSCOPIC ILEOCOLECTOMY, LYSIS OF ADHESIONS, BILATERAL TAP BLOCK;  Surgeon: Teresa Lonni HERO, MD;  Location: [facility];  Service: General;  Laterality: N/A;  POLYPECTOMY  10/16/2023   Procedure: POLYPECTOMY;  Surgeon: Wilhelmenia Aloha Raddle., MD;  Location: [facility];  Service: Gastroenterology;;   spinal ablation  05/23/2022   SPINE SURGERY     Herniated Disc x 2   tummy tuck  2001     Past OB/GYN History: OB History  Gravida Para Term Preterm AB Living  2 2 2   3   SAB IAB Ectopic Multiple Live Births     1     # Outcome Date GA Lbr Len/2nd Weight Sex Type Anes PTL Lv  2 Term       CS-Unspec     1 Term      CS-Unspec      Menopausal: Denies vaginal bleeding since menopause Last pap smear was  many years ago - has not seen GYN  Medications: Patient has a current medication list which includes the following prescription(s): atorvastatin , estradiol , lisinopril , mirabegron  er, mounjaro , and xiidra.   Allergies: Patient is allergic to humira  [adalimumab ], penicillins, and azathioprine .   Social History: Social History[1]  Relationship status: married Patient lives with husband.   Patient is not employed. Regular exercise: Yes: tennis and golf History of abuse: No  Family History:   Family History  Problem Relation Age of Onset   Leukemia Mother    Heart failure Mother    Hypertension Mother    Diabetes Mother    Breast cancer Mother    Hearing loss Mother    Kidney disease Mother    Hypertension Father    Cancer Father    Esophageal cancer Father    Depression Father    Alcohol abuse Father    Depression Sister    Cancer Sister    Leukemia Sister 5   Cervical cancer Sister    Breast cancer Maternal Aunt 43   Breast cancer Maternal Grandmother        dx in her late 24s   Cancer Maternal Grandfather    Heart attack Paternal Grandfather    Colon cancer Neg Hx    Stomach cancer Neg Hx    Inflammatory bowel disease Neg Hx    Liver disease Neg Hx    Pancreatic cancer Neg Hx    Rectal cancer Neg Hx      Review of Systems: Review of Systems  Constitutional:  Negative for fever, malaise/fatigue and weight loss.  Respiratory:  Negative for cough, shortness of breath and wheezing.   Cardiovascular:  Negative for chest pain, palpitations and leg swelling.  Gastrointestinal:  Positive for abdominal pain. Negative for blood in stool.  Genitourinary:  Negative for dysuria.       + hot flashes  Musculoskeletal:  Negative for myalgias.  Skin:  Negative for rash.  Neurological:  Negative for dizziness and headaches.  Endo/Heme/Allergies:  Does not  bruise/bleed easily.  Psychiatric/Behavioral:  Negative for depression. The patient is not nervous/anxious.      OBJECTIVE Physical Exam: Vitals:   11/11/24 1048  BP: 130/84  Pulse: 77  Weight: 129 lb 6.4 oz (58.7 kg)  Height: 5' 4.7 (1.643 m)    Physical Exam Vitals reviewed. Exam conducted with a chaperone present.  Constitutional:      General: She is not in acute distress. Pulmonary:     Effort: Pulmonary effort is normal.  Abdominal:     General: There is no distension.     Palpations: Abdomen is soft.     Tenderness: There is no abdominal tenderness. There is no rebound.  Musculoskeletal:  General: No swelling. Normal range of motion.  Skin:    General: Skin is warm and dry.     Findings: No rash.  Neurological:     Mental Status: She is alert and oriented to person, place, and time.  Psychiatric:        Mood and Affect: Mood normal.        Behavior: Behavior normal.      GU / Detailed Urogynecologic Evaluation:  Pelvic Exam: Normal external female genitalia; Bartholin's and Skene's glands normal in appearance; urethral meatus normal in appearance, no urethral masses or discharge.   CST: negative  Urethra was prepped with betadine and straight catheter placed.   Speculum exam reveals normal vaginal mucosa with atrophy. Cervix normal appearance. Uterus normal single, nontender. Adnexa no mass, fullness, tenderness.     Pelvic floor strength I/V  Pelvic floor musculature: Right levator non-tender, Right obturator non-tender, Left levator non-tender, Left obturator non-tender  POP-Q:   POP-Q  -3                                            Aa   -3                                           Ba  -7.5                                              C   1.5                                            Gh  4                                            Pb  8                                            tvl   -3                                             Ap  -3                                            Bp  -8                                              D      Rectal Exam:  Fecal soiling present, normal appearing  anus  Post-Void Residual (PVR) by Bladder Scan: In order to evaluate bladder emptying, we discussed obtaining a postvoid residual and patient agreed to this procedure.  Procedure: The ultrasound unit was placed on the patient's abdomen in the suprapubic region after the patient had voided.    Post Void Residual - 11/11/24 1119       Post Void Residual   Post Void Residual 42 mL           Laboratory Results: Lab Results  Component Value Date   COLORU yellow 11/11/2024   CLARITYU clear 11/11/2024   GLUCOSEUR negative 11/11/2024   BILIRUBINUR negative 11/11/2024   KETONESU Negative 05/31/2024   SPECGRAV 1.025 11/11/2024   RBCUR negative 11/11/2024   PHUR 6.0 11/11/2024   PROTEINUR Negative 05/31/2024   UROBILINOGEN 0.2 11/11/2024   LEUKOCYTESUR Negative 11/11/2024    Lab Results  Component Value Date   CREATININE 0.86 09/17/2024   CREATININE 0.91 04/11/2024   CREATININE 0.92 02/27/2024    Lab Results  Component Value Date   HGBA1C 5.7 10/01/2024    Lab Results  Component Value Date   HGB 14.4 09/17/2024     ASSESSMENT AND PLAN Ms. Ikner is a 68 y.o. with:  1. Recurrent urinary tract infection   2. Nocturia   3. Urinary frequency   4. Vaginal dryness   5. Well woman exam   6. Fecal soiling     Recurrent urinary tract infection Assessment & Plan: - For treatment of recurrent urinary tract infections, we discussed management of recurrent UTIs including prophylaxis with , transvaginal estrogen therapy. Since she has not had a positive culture since July, so will hold off on any prophylactic antibiotics.  - We discussed increasing hydration and decreasing acidic beverages as urine odor is not always indicative of UTI but can be impacted by diet.  - Will send urine for culture today due  to presence of nitrites on cath sample   Nocturia Assessment & Plan: - We discussed the symptoms of overactive bladder (OAB), which include urinary urgency, urinary frequency, nocturia, with or without urge incontinence.  While we do not know the exact etiology of OAB, several treatment options exist. We discussed management including behavioral therapy (decreasing bladder irritants, urge suppression strategies, timed voids, bladder retraining), physical therapy, medication.  - Prescribed Myrbetriq  25mg  daily   Orders: -     Mirabegron  ER; Take 1 tablet (25 mg total) by mouth daily.  Dispense: 30 tablet; Refill: 5  Urinary frequency -     POCT URINALYSIS DIP (CLINITEK) -     Urine Culture; Future  Vaginal dryness Assessment & Plan: -For symptomatic vaginal atrophy options include lubrication with a water-based lubricant, personal hygiene measures and moisturizers, and estrogen replacement in the form of vaginal cream, vaginal tablets, or a time-released vaginal ring.   - Will start estrace  cream. Also recommended daily vaginal moisture with coconut oil or vitamin E. Recommended silicone based lubricant for intercourse.   Orders: -     Estradiol ; Place 0.5g nightly for two weeks then twice a week after  Dispense: 42.5 g; Refill: 11  Well woman exam -     Ambulatory referral to Obstetrics / Gynecology  Fecal soiling Assessment & Plan: - Noted on exam today but patient denies this is occurring at home.  - We discussed hygiene and using a squirt bottle to clean after bowel movements, as fecal contamination can increase frequency of urinary tract infections.    Return 6 weeks  Angela LOISE Caper, MD        [1]  Social History Tobacco Use   Smoking status: Former    Types: Cigarettes   Smokeless tobacco: Never  Vaping Use   Vaping status: Never Used  Substance Use Topics   Alcohol use: Not Currently    Comment: occasionally   Drug use: Never   "

## 2024-11-11 NOTE — Progress Notes (Signed)
 Please let patient know that Myrbetriq  requires PA so ordered trospium  20 BID as alternative.

## 2024-11-11 NOTE — Patient Instructions (Addendum)
 For vaginal atrophy (thinning of the vaginal tissue that can cause dryness and burning) and UTI prevention we discussed estrogen replacement in the form of vaginal cream.   Start vaginal estrogen therapy nightly for two weeks then 2 times weekly at night. This can be placed with your finger or an applicator inside the vagina and around the urethra.  Please let us  know if the prescription is too expensive and we can look for alternative options.   Is vaginal estrogen therapy safe for me? Vaginal estrogen preparations act on the vaginal skin, and only a very tiny amount is absorbed into the bloodstream (0.01%).  They work in a similar way to hand or face cream.  There is minimal absorption and they are therefore perfectly safe. If you have had breast cancer and have persistent troublesome symptoms which aren't settling with vaginal moisturisers and lubricants, local estrogen treatment may be a possibility, but consultation with your oncologist should take place first.   Vulvovaginal moisturizer Options: Vitamin E oil (pump or capsule) or cream (Gene's Vit E Cream) Coconut oil Silicone-based lubricant for use during intercourse (uberlube or wet platinum is a brand available at most drugstores) Crisco Consider the ingredients of the product - the fewer the ingredients the better!  Directions for Use: Clean and dry your hands Gently dab the vulvar/vaginal area dry as needed Apply a pea-sized amount of the moisturizer onto your fingertip Using you other hand, open the labia  Apply the moisturizer to the vulvar/vaginal tissues Wear loose fitting underwear/clothing if possible following application Use moisturize up to 3 times daily as desired.  Today we talked about ways to manage bladder urgency such as altering your diet to avoid irritative beverages and foods (bladder diet) as well as attempting to decrease stress and other exacerbating factors.    The Most Bothersome Foods* The Least  Bothersome Foods*  Coffee - Regular & Decaf Tea - caffeinated Carbonated beverages - cola, non-colas, diet & caffeine-free Alcohols - Beer, Red Wine, White Wine, [address] - Grapefruit, [place], Orange, [employer] - Cranberry, Grapefruit, Orange, Pineapple Vegetables - Tomato & Tomato Products Flavor Enhancers - Hot peppers, Spicy foods, Chili, Horseradish, Vinegar, Monosodium glutamate (MSG) Artificial Sweeteners - NutraSweet, Sweet 'N Low, Equal (sweetener), Saccharin Ethnic foods - Mexican, Thai, Indian food [employer] - low-fat & whole Fruits - Bananas, Blueberries, Honeydew melon, Pears, Raisins, Watermelon Vegetables - Broccoli, [address] Sprouts, [place], Carrots, Cauliflower, [place], Cucumber, Mushrooms, Peas, Radishes, Squash, Zucchini, White potatoes, Sweet potatoes & yams Poultry - Chicken, Eggs, Turkey, [employer] - Beef, [employer], Lamb Seafood - Shrimp, [place] fish, Salmon Grains - Oat, Rice Snacks - Pretzels, Popcorn  *Mitch ALF et al. Diet and its role in interstitial cystitis/bladder pain syndrome (IC/BPS) and comorbid conditions. [employer]. BJU Int. 2012 Jan 11.

## 2024-11-11 NOTE — Assessment & Plan Note (Signed)
-   We discussed the symptoms of overactive bladder (OAB), which include urinary urgency, urinary frequency, nocturia, with or without urge incontinence.  While we do not know the exact etiology of OAB, several treatment options exist. We discussed management including behavioral therapy (decreasing bladder irritants, urge suppression strategies, timed voids, bladder retraining), physical therapy, medication.  - Prescribed Myrbetriq  25mg  daily

## 2024-11-12 ENCOUNTER — Ambulatory Visit: Payer: Self-pay | Admitting: Obstetrics and Gynecology

## 2024-11-12 DIAGNOSIS — N3000 Acute cystitis without hematuria: Secondary | ICD-10-CM

## 2024-11-12 MED ORDER — SULFAMETHOXAZOLE-TRIMETHOPRIM 800-160 MG PO TABS: 1.0000 | ORAL_TABLET | Freq: Two times a day (BID) | ORAL | 0 refills | Status: AC

## 2024-11-13 LAB — URINE CULTURE: Culture: >=100000 — AB

## 2024-11-18 ENCOUNTER — Ambulatory Visit (HOSPITAL_COMMUNITY): Admit: 2024-11-18 | Admitting: Gastroenterology

## 2024-11-18 ENCOUNTER — Encounter (HOSPITAL_COMMUNITY): Payer: Self-pay

## 2024-11-18 SURGERY — COLONOSCOPY
Anesthesia: Monitor Anesthesia Care

## 2024-11-20 NOTE — Progress Notes (Signed)
 Patient has received and started taking medication.

## 2024-11-20 NOTE — Progress Notes (Signed)
 Attempted to contact patient. [facility]

## 2024-11-22 NOTE — Progress Notes (Signed)
 Patient has been notified

## 2024-11-27 ENCOUNTER — Ambulatory Visit: Admitting: Obstetrics and Gynecology

## 2024-11-29 ENCOUNTER — Encounter

## 2024-12-02 ENCOUNTER — Encounter: Payer: Self-pay | Admitting: Obstetrics and Gynecology

## 2024-12-02 ENCOUNTER — Ambulatory Visit

## 2024-12-02 ENCOUNTER — Other Ambulatory Visit (HOSPITAL_COMMUNITY)
Admission: RE | Admit: 2024-12-02 | Discharge: 2024-12-02 | Disposition: A | Discharge status: Home / Self Care | Source: Ambulatory Visit | Attending: Obstetrics and Gynecology | Admitting: Obstetrics and Gynecology

## 2024-12-02 VITALS — BP 138/81 | HR 80

## 2024-12-02 DIAGNOSIS — N390 Urinary tract infection, site not specified: Secondary | ICD-10-CM | POA: Diagnosis present

## 2024-12-02 DIAGNOSIS — R8290 Unspecified abnormal findings in urine: Secondary | ICD-10-CM | POA: Insufficient documentation

## 2024-12-02 LAB — URINALYSIS, COMPLETE (UACMP) WITH MICROSCOPIC
Bilirubin Urine: NEGATIVE
Glucose, UA: NEGATIVE mg/dL
Ketones, ur: NEGATIVE mg/dL
Nitrite: POSITIVE — AB
Protein, ur: NEGATIVE mg/dL
Specific Gravity, Urine: 1.018 (ref 1.005–1.030)
WBC, UA: >50 WBC/hpf (ref 0–5)
pH: 5 (ref 5.0–8.0)

## 2024-12-02 LAB — POCT URINALYSIS DIP (CLINITEK)
Bilirubin, UA: NEGATIVE
Glucose, UA: NEGATIVE mg/dL
Ketones, POC UA: NEGATIVE mg/dL
Nitrite, UA: POSITIVE — AB
POC PROTEIN,UA: NEGATIVE
Spec Grav, UA: 1.025
Urobilinogen, UA: 0.2 U/dL
pH, UA: 5

## 2024-12-02 MED ORDER — SULFAMETHOXAZOLE-TRIMETHOPRIM 800-160 MG PO TABS: 1.0000 | ORAL_TABLET | Freq: Two times a day (BID) | ORAL | 0 refills | Status: AC

## 2024-12-02 NOTE — Progress Notes (Signed)
 Angela [name] arrived today with urine odor . Patient is notexperiencing fever, unstable vitals and/or one-sided back flank pain. Patient has not had had a recent hospitalization due to UTI.  Last visit in the office was 11/11/2024.  Per protocol:   The most recent Urinalysis completed on 11-11-2024 and was notnormal.  Last Creatinine level  Lab Results  Component Value Date   CREATININE 0.86 09/17/2024    An urine specimen was collected and POCT urinalysis completed. [] A cath specimen was collected due to patient's current condition, symptoms or post-procedural state.  Total urine output by catheter is  Output by Drain (mL) 11/30/24 0701 - 11/30/24 1900 11/30/24 1901 - 12/01/24 0700 12/01/24 0701 - 12/01/24 1900 12/01/24 1901 - 12/02/24 0700 12/02/24 0701 - 12/02/24 1422  Patient has no LDAs of requested type attached.    Angela [name]    POCT Urine results is not normal.  Urine micro was sent per protocol for abnormal urinalysis.  Urine culture was sent per protocol for abnormal urinalysis.     [x] Pt was notified of positive urine results and plan for additional urine testing. We will contact you within the next 3-4 days with these results.  [] No Prescription was sent to your pharmacy.  The additional testing will indicate if a prescription is needed.   [] Patient was notified of abnormal urine results. The following prescription is sent to your preferred pharmacy.  []  Macrobid  100mg  #10 1 tablet by mouth twice daily with food for 5 days      [x]  Bactrim  DS 800-160mg  #6 1 tablet by mouth twice daily for 3 days        []  Due to your current medication allergies, an alternate prescription was discussed with your provider and will be prescribed and sent to your pharmacy.  [x] You can take over the counter AZO two tablets up to three times a day for two days.  Take AZO tablets with a full glass of water. AZO will turn your urine orange, this is normal.   [] The patient was notified of negative urine results.   If symptoms persist, you may take over the counter AZO two tablets up to three times a day for two days.  AZO will turn your urine orange, this is normal.  Contact the office back to schedule an appointment if your symptoms persist or worsen or you develop additional symptoms.       CC'd note to patient's provider.

## 2024-12-03 ENCOUNTER — Ambulatory Visit: Admitting: Gastroenterology

## 2024-12-03 DIAGNOSIS — K5090 Crohn's disease, unspecified, without complications: Secondary | ICD-10-CM | POA: Diagnosis not present

## 2024-12-03 DIAGNOSIS — K50819 Crohn's disease of both small and large intestine with unspecified complications: Secondary | ICD-10-CM

## 2024-12-03 NOTE — Patient Instructions (Signed)
POST CAPSULE INSTRUCTIONS:   Contact our office immediately at [phone] if you suffer from any abdominal pain, nausea, or vomiting during capsule endoscopy. Do not eat or drink for at least 2 hours. After 2 hours you may have any of the following to drink: Water                           White grape juice 7-Up                            Chicken Bouillon Sprite                           Ginger Ale  After 4 hours you may have a light snack to include any of the following: A cup of soup               sandwich Bowl of cereal             Rice Toast                           Eggs 2-3 small cookies (i.e. vanilla wafers or graham crackers)  Return to this office at 4:00 pm. After this you will be able to return to your normal diet.  During your procedure do not go near anyone else that is having capsule endoscopy.  Do not be in close contact with an MRI machine or a radio or television tower.   Do not wear a heavy coat or sweater because your recorder may over heat and stop recording.   Do not disconnect the equipment or remove the belt at any time.  Since the Data Recorder is actually a small computer, it should be treated with utmost care and protection.  Avoid sudden movement and banging of the Data Recorder.    Do not do any heavy lifting or strenuous physical activity during the test especially if it involves sweating.  Do not bend over or stoop during capsule endoscopy.  During capsule endoscopy, you will need to verify every 15 minutes that the small light on top of the Data Recorder is blinking twice per second.  If for some reason it stops blinking, record the time and contact our office at [phone].                     

## 2024-12-03 NOTE — Progress Notes (Signed)
 CAPSULE ID: [id number] Exp: 2025-11-22 LOT: [id number]  Patient arrived for capsule endoscopy. Reported the prep went well. Confirmed patient is fasting. Explained dietary restrictions for the next few hours. Patient verbalized understanding. Opened capsule, ensured capsule was flashing and transmitting to the recorder prior to the patient swallowing the capsule. Patient swallowed capsule without difficulty. Patient told to call the office with any questions. Understands to return to the office today between 4:00 and 4:30 pm. No further questions by the conclusion of the visit.

## 2024-12-04 ENCOUNTER — Ambulatory Visit: Payer: Self-pay | Admitting: Obstetrics and Gynecology

## 2024-12-04 LAB — HM DIABETES EYE EXAM

## 2024-12-04 LAB — URINE CULTURE: Culture: >=100000 — AB

## 2024-12-04 MED ORDER — NITROFURANTOIN MONOHYD MACRO 100 MG PO CAPS: 100.0000 mg | ORAL_CAPSULE | Freq: Every day | ORAL | 5 refills | Status: AC

## 2024-12-16 ENCOUNTER — Other Ambulatory Visit: Payer: Self-pay | Admitting: Family Medicine

## 2024-12-24 ENCOUNTER — Telehealth: Payer: Self-pay

## 2024-12-24 DIAGNOSIS — Z189 Retained foreign body fragments, unspecified material: Secondary | ICD-10-CM

## 2024-12-24 NOTE — Telephone Encounter (Signed)
 February 27, 3:50 PM

## 2024-12-24 NOTE — Telephone Encounter (Signed)
-----   Message from Aloha Finner, MD sent at 12/23/2024  3:23 PM EST ----- Regarding: Follow-up VCE Pod C nurses, This patient needs to be updated about the results of her VCE (see if the report I have placed on her VCE day). As a result of the VCE not making it into the colon, we need a 2 view KUB to be performed. If she however did not notice the capsule in any of her stools passing, then she does not need the x-ray. I also need to set her up a clinic follow-up with me in the next 4 weeks (okay to use overbook or held slot) so that I can discuss with her potential alteration in her biologic therapy for Crohn's. Thanks. GM

## 2024-12-24 NOTE — Telephone Encounter (Signed)
 The pt has been advised that the VCE did not make it into the colon.  She is coming in for [employer]. (Order entered)    Dr Wilhelmenia you have no appt available for the whole month of March. You are already double booked.  Please advise

## 2024-12-24 NOTE — Telephone Encounter (Signed)
 You already have a 3:50 pm. Do you want to add another?

## 2024-12-25 ENCOUNTER — Ambulatory Visit
Admission: RE | Admit: 2024-12-25 | Discharge: 2024-12-25 | Disposition: A | Source: Ambulatory Visit | Attending: Gastroenterology | Admitting: Gastroenterology

## 2024-12-25 ENCOUNTER — Ambulatory Visit: Admitting: Obstetrics and Gynecology

## 2024-12-25 ENCOUNTER — Encounter: Payer: Self-pay | Admitting: Obstetrics and Gynecology

## 2024-12-25 VITALS — BP 121/87 | HR 76

## 2024-12-25 DIAGNOSIS — Z189 Retained foreign body fragments, unspecified material: Secondary | ICD-10-CM

## 2024-12-25 DIAGNOSIS — N952 Postmenopausal atrophic vaginitis: Secondary | ICD-10-CM | POA: Insufficient documentation

## 2024-12-25 DIAGNOSIS — R351 Nocturia: Secondary | ICD-10-CM

## 2024-12-25 DIAGNOSIS — Z87440 Personal history of urinary (tract) infections: Secondary | ICD-10-CM | POA: Diagnosis not present

## 2024-12-25 DIAGNOSIS — N390 Urinary tract infection, site not specified: Secondary | ICD-10-CM

## 2024-12-25 NOTE — Assessment & Plan Note (Signed)
-   continue with daily bactrim  prophylaxis

## 2024-12-25 NOTE — Patient Instructions (Signed)
 Vulvovaginal moisturizer Options: Vitamin E oil (pump or capsule) or cream (Gene's Vit E Cream) Coconut oil Meidcine Mama V-magic- found on [employer] Crisco Consider the ingredients of the product - the fewer the ingredients the better!  Directions for Use: Clean and dry your hands Gently dab the vulvar/vaginal area dry as needed Apply a pea-sized amount of the moisturizer onto your fingertip Using you other hand, open the labia  Apply the moisturizer to the vulvar/vaginal tissues Wear loose fitting underwear/clothing if possible following application Use moisturize up to 3 times daily as desired.

## 2024-12-25 NOTE — Assessment & Plan Note (Signed)
-   continue vaginal estrogen twice a week - Also recommend daily vaginal moisturizer such as coconut oil or vitamin E cream

## 2024-12-25 NOTE — Progress Notes (Signed)
 [facility] Return Visit  SUBJECTIVE  History of Present Illness: Angela [name] is a 69 y.o. female seen in follow-up for recurrent UTI and OAB. Plan at last visit was to start trospium  20mg  BID. Now urinating at night 2-3 times, which is an improvement.  We did order Myrbetriq  but it was not approved. She is going on medicare starting march and would be interested in trying to get it approved at that time.   In January, she started noticing the urine odor and then came in and was tested for the UTI. Urine culture was positive. She was treated with bactrim  DS BID then placed on daily bactrim  for prophylaxis. She is using the vaginal estrogen cream twice a week. Still feeling some dryness and not using any daily vaginal moisturizer.    Past Medical History: Patient  has a past medical history of Bright red blood per rectum (09/18/2024), C. difficile diarrhea, Crohn's colitis (HCC), Cyclical vomiting syndrome (03/21/2022), Diabetes mellitus without complication (HCC), Diplopia (05/05/2022), Family history of breast cancer, Hyperlipidemia, Hypertension, Increased frequency of headaches (05/05/2022), Intra-abdominal phelgmon (RLQ) (03/08/2018), Lower abdominal pain (08/31/2023), and PONV (postoperative nausea and vomiting).   Past Surgical History: She  has a past surgical history that includes Cesarean section; Knee surgery (Right); Spine surgery; Cholecystectomy (2011); tummy tuck (2001); Colonoscopy (12/2019); Laparoscopic subtotal colectomy (N/A, 03/04/2020); Flexible sigmoidoscopy (03/04/2020); Colonoscopy with propofol  (N/A, 04/29/2021); biopsy (04/29/2021); spinal ablation (05/23/2022); Appendectomy; Colonoscopy with propofol  (N/A, 10/16/2023); biopsy (10/16/2023); and polypectomy (10/16/2023).   Medications: She has a current medication list which includes the following prescription(s): atorvastatin , estradiol , lisinopril , nitrofurantoin  (macrocrystal-monohydrate), mounjaro ,  trospium , ustekinumab -auub, xiidra , and mirabegron  er.   Allergies: Patient is allergic to humira  [adalimumab ], penicillins, and azathioprine .   Social History: Patient  reports that she has quit smoking. Her smoking use included cigarettes. She has never used smokeless tobacco. She reports that she does not currently use alcohol. She reports that she does not use drugs.     OBJECTIVE     Physical Exam: Vitals:   12/25/24 1056  BP: 121/87  Pulse: 76   Gen: No apparent distress, A&O x 3.  Detailed Urogynecologic Evaluation:  Deferred.    ASSESSMENT AND PLAN    Ms. Reining is a 70 y.o. with:  1. Nocturia   2. Recurrent urinary tract infection   3. Vaginal atrophy     Nocturia Assessment & Plan: - Has seen improvement with the bactrim  but still voiding 2-3 times at night - Will plan to get approval for myrbetriq  once she switches to medicare in March. She will message us  to let us  know when her insurance has been changed so we can send a new prescription.    Recurrent urinary tract infection Assessment & Plan: - continue with daily bactrim  prophylaxis   Vaginal atrophy Assessment & Plan: - continue vaginal estrogen twice a week - Also recommend daily vaginal moisturizer such as coconut oil or vitamin E cream   Follow up 3 months or sooner if needed   Rosaline LOISE Caper, MD  Time spent: I spent 20 minutes dedicated to the care of this patient on the date of this encounter to include pre-visit review of records, face-to-face time with the patient and post visit documentation.

## 2024-12-25 NOTE — Telephone Encounter (Signed)
 Add as 130 on 2/30 or 3/6. Thanks. GM

## 2024-12-25 NOTE — Assessment & Plan Note (Signed)
-   Has seen improvement with the bactrim  but still voiding 2-3 times at night - Will plan to get approval for myrbetriq  once she switches to medicare in March. She will message us  to let us  know when her insurance has been changed so we can send a new prescription.

## 2024-12-25 NOTE — Telephone Encounter (Signed)
 Appt made for 01/24/25 at 130 pm with Dr Wilhelmenia  Message sent to the pt My Chart - she does view messages

## 2024-12-26 ENCOUNTER — Encounter: Payer: Self-pay | Admitting: Family Medicine

## 2024-12-26 ENCOUNTER — Ambulatory Visit: Payer: Self-pay | Admitting: Gastroenterology

## 2024-12-26 ENCOUNTER — Other Ambulatory Visit (HOSPITAL_COMMUNITY): Payer: Self-pay

## 2024-12-27 ENCOUNTER — Other Ambulatory Visit: Payer: Self-pay

## 2024-12-27 ENCOUNTER — Other Ambulatory Visit (HOSPITAL_COMMUNITY): Payer: Self-pay

## 2024-12-27 MED ORDER — XIIDRA 5 % OP SOLN
1.0000 [drp] | Freq: Two times a day (BID) | OPHTHALMIC | 10 refills | Status: AC
  Filled 2024-12-27: qty 60, 30d supply, fill #0

## 2024-12-27 MED ORDER — MOUNJARO 12.5 MG/0.5ML ~~LOC~~ SOAJ: 12.5000 mg | SUBCUTANEOUS | 0 refills | Status: AC

## 2025-01-24 ENCOUNTER — Ambulatory Visit: Admitting: Gastroenterology

## 2025-03-18 ENCOUNTER — Ambulatory Visit: Admitting: Family Medicine

## 2025-03-25 ENCOUNTER — Ambulatory Visit: Admitting: Obstetrics and Gynecology
